# Patient Record
Sex: Female | Born: 1995 | Race: White | Hispanic: No | Marital: Single | Smoking: Never smoker
Health system: Southern US, Community
[De-identification: ages and names within clinical notes are randomized; demographics above are authoritative.]

## PROBLEM LIST (undated history)

## (undated) ENCOUNTER — Inpatient Hospital Stay: Payer: Self-pay

## (undated) DIAGNOSIS — L509 Urticaria, unspecified: Secondary | ICD-10-CM

## (undated) DIAGNOSIS — F1290 Cannabis use, unspecified, uncomplicated: Secondary | ICD-10-CM

## (undated) DIAGNOSIS — E7849 Other hyperlipidemia: Secondary | ICD-10-CM

## (undated) DIAGNOSIS — I10 Essential (primary) hypertension: Secondary | ICD-10-CM

## (undated) DIAGNOSIS — R51 Headache: Secondary | ICD-10-CM

## (undated) DIAGNOSIS — R519 Headache, unspecified: Secondary | ICD-10-CM

## (undated) DIAGNOSIS — N946 Dysmenorrhea, unspecified: Secondary | ICD-10-CM

## (undated) DIAGNOSIS — F4321 Adjustment disorder with depressed mood: Secondary | ICD-10-CM

## (undated) DIAGNOSIS — R809 Proteinuria, unspecified: Secondary | ICD-10-CM

## (undated) DIAGNOSIS — E669 Obesity, unspecified: Secondary | ICD-10-CM

## (undated) DIAGNOSIS — G43009 Migraine without aura, not intractable, without status migrainosus: Secondary | ICD-10-CM

## (undated) HISTORY — DX: Essential (primary) hypertension: I10

## (undated) HISTORY — DX: Morbid (severe) obesity due to excess calories: E66.01

## (undated) HISTORY — PX: NO PAST SURGERIES: SHX2092

## (undated) HISTORY — DX: Proteinuria, unspecified: R80.9

## (undated) HISTORY — DX: Dysmenorrhea, unspecified: N94.6

## (undated) HISTORY — DX: Obesity, unspecified: E66.9

## (undated) HISTORY — DX: Other hyperlipidemia: E78.49

## (undated) HISTORY — DX: Headache: R51

## (undated) HISTORY — DX: Cannabis use, unspecified, uncomplicated: F12.90

## (undated) HISTORY — DX: Adjustment disorder with depressed mood: F43.21

## (undated) HISTORY — DX: Urticaria, unspecified: L50.9

## (undated) HISTORY — DX: Migraine without aura, not intractable, without status migrainosus: G43.009

## (undated) HISTORY — DX: Headache, unspecified: R51.9

---
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
## 2013-07-18 IMAGING — US US RENAL KIDNEY
1 series · 14 of 25 positions shown · non-contrast
Comparison: none

REASON FOR EXAM: HTN microalbuminuria
COMMENTS:

[Series 1: us renal kidney · 0.31mm/px · 14 of 34 slices shown]
[im 1/34]
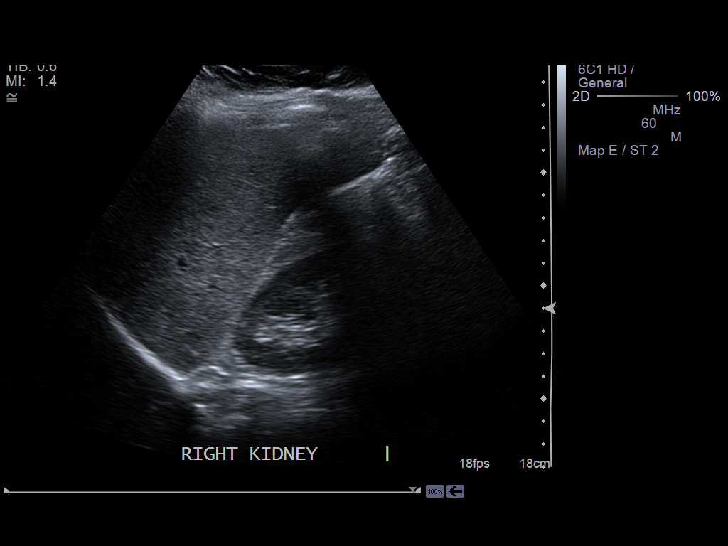
[im 3/34]
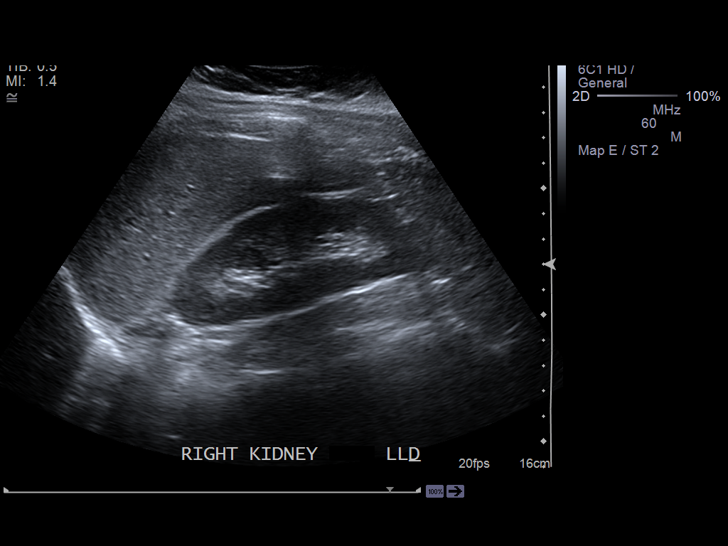
[im 6/34]
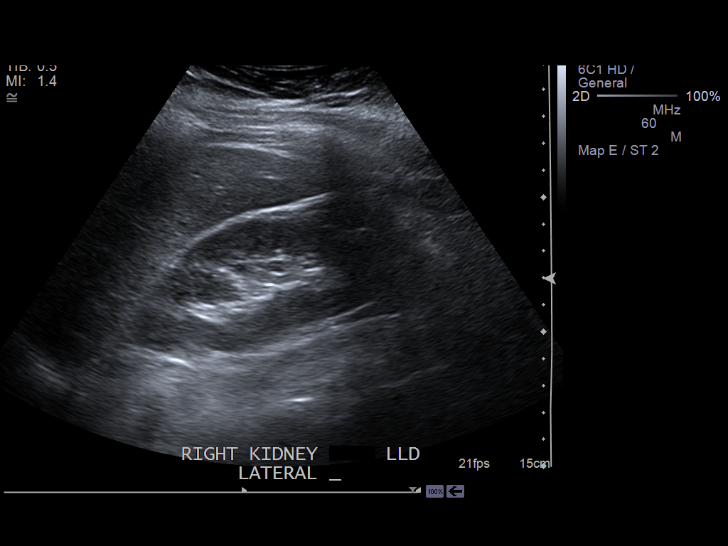
[im 9/34]
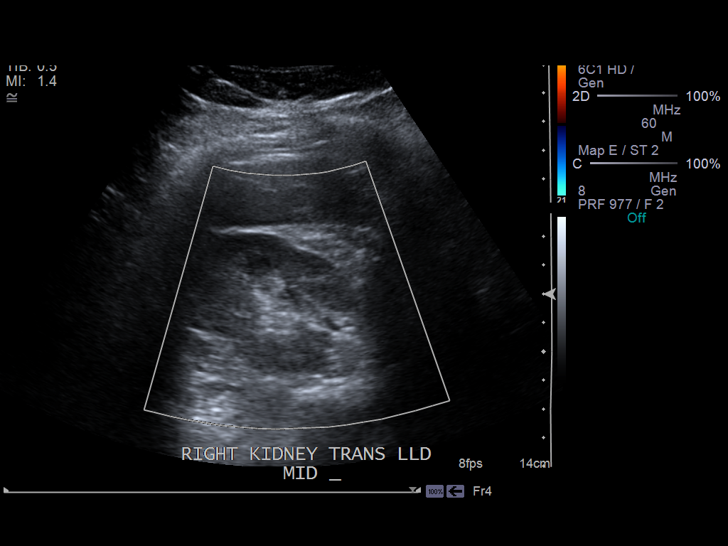
[im 12/34]
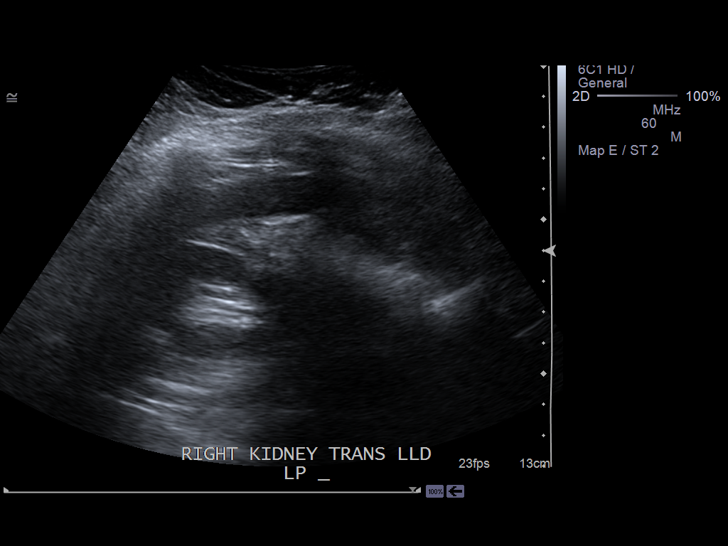
[im 13/34]
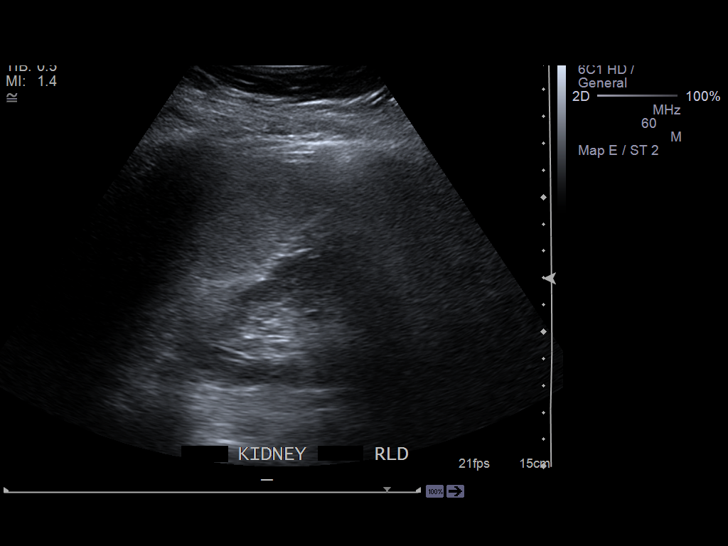
[im 16/34]
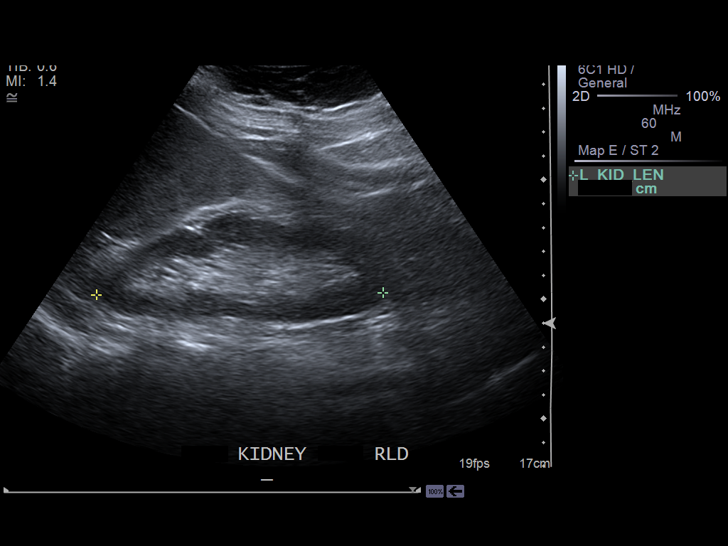
[im 18/34]
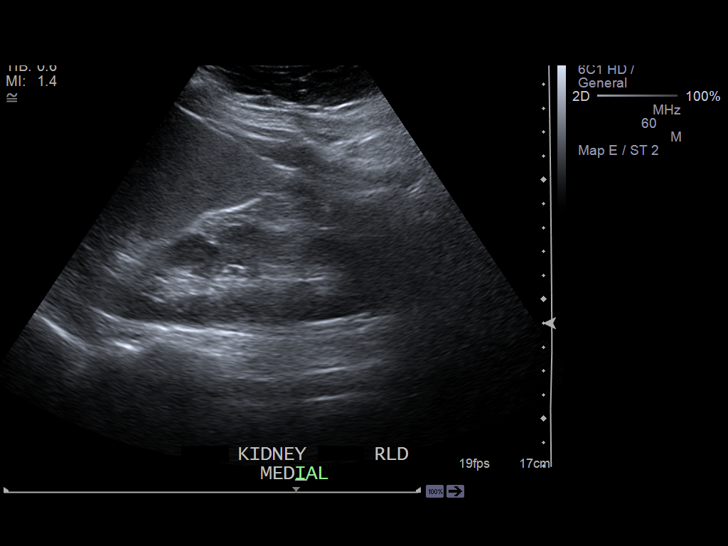
[im 21/34]
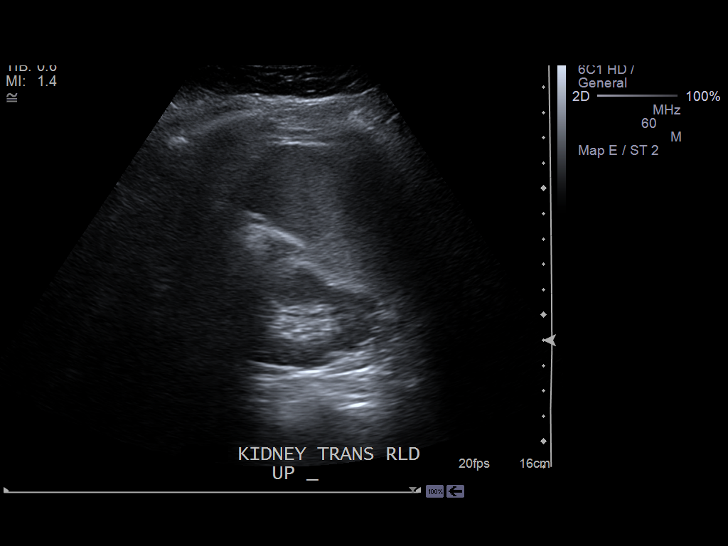
[im 23/34]
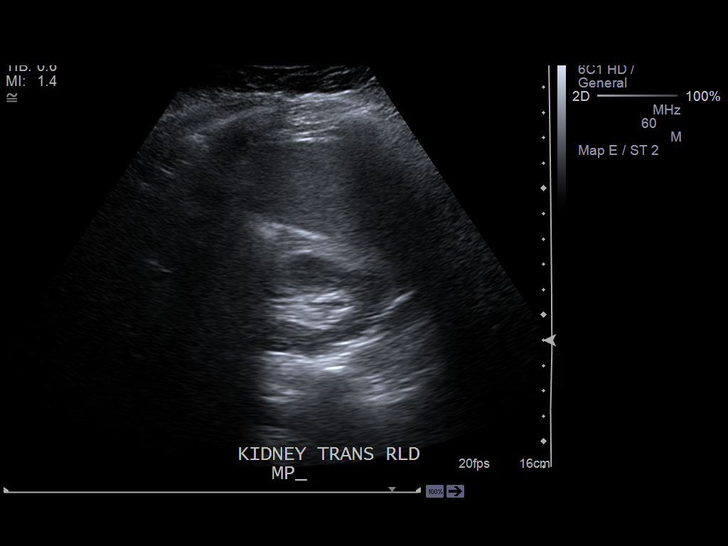
[im 25/34]
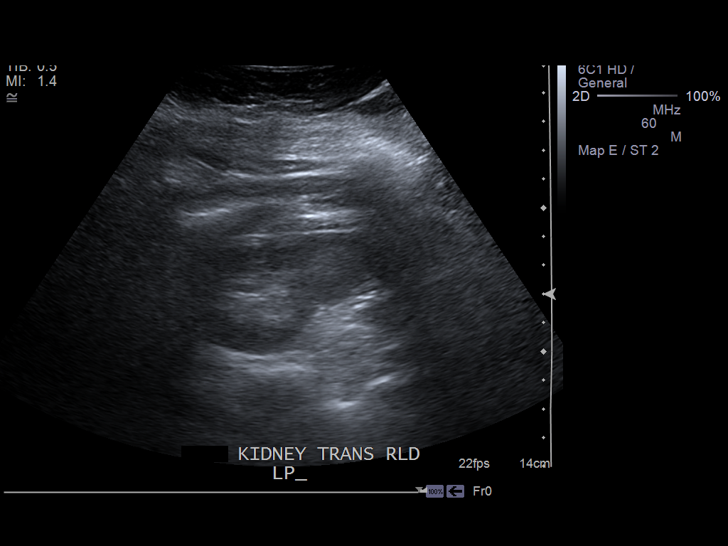
[im 28/34]
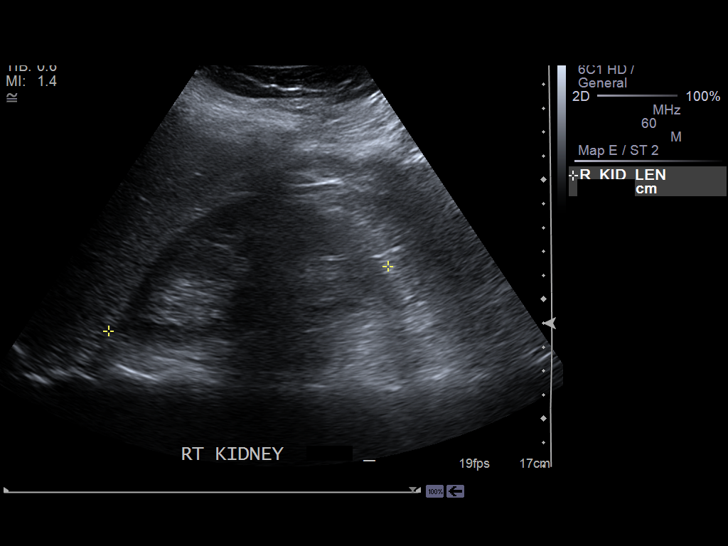
[im 31/34]
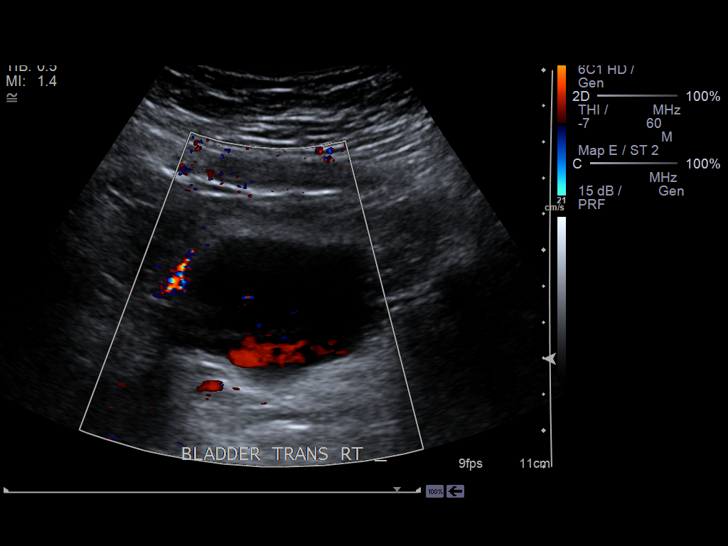
[im 34/34]
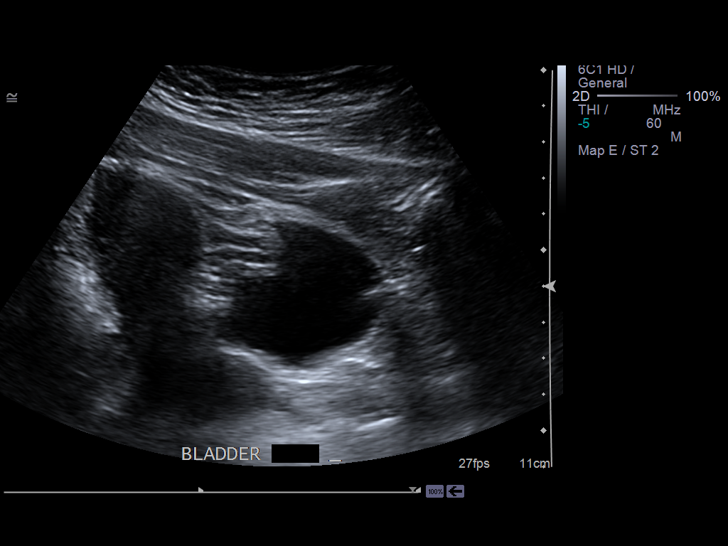

[14 of 25 positions shown; findings below may reference images not displayed]

PROCEDURE:     US  - US KIDNEY  - October 27, 2011  [DATE]

RESULT:     The kidneys are normal in echotexture and contour. The right
renal cortical echotexture remains lower than that of the adjacent liver. No
cystic or solid masses are demonstrated. No calcified stones are
demonstrated. No perinephric fluid collections are demonstrated.

The right kidney measures 12 x 5.3 x 4.5 cm. The left kidney measures 12 x
4.4 x 4.2 cm. The urinary bladder is adequately distended and bilateral
ureteral jets are demonstrated.
IMPRESSION: Normal renal ultrasound examination.

## 2014-04-11 DIAGNOSIS — F4321 Adjustment disorder with depressed mood: Secondary | ICD-10-CM | POA: Insufficient documentation

## 2014-04-20 DIAGNOSIS — F1290 Cannabis use, unspecified, uncomplicated: Secondary | ICD-10-CM

## 2014-04-20 HISTORY — DX: Cannabis use, unspecified, uncomplicated: F12.90

## 2014-10-08 ENCOUNTER — Encounter: Payer: Self-pay | Admitting: Family Medicine

## 2014-10-08 DIAGNOSIS — N946 Dysmenorrhea, unspecified: Secondary | ICD-10-CM | POA: Insufficient documentation

## 2014-10-08 DIAGNOSIS — L74510 Primary focal hyperhidrosis, axilla: Secondary | ICD-10-CM | POA: Insufficient documentation

## 2014-10-08 DIAGNOSIS — G43009 Migraine without aura, not intractable, without status migrainosus: Secondary | ICD-10-CM

## 2014-10-08 DIAGNOSIS — E66811 Obesity, class 1: Secondary | ICD-10-CM | POA: Insufficient documentation

## 2014-10-08 DIAGNOSIS — O10919 Unspecified pre-existing hypertension complicating pregnancy, unspecified trimester: Secondary | ICD-10-CM | POA: Insufficient documentation

## 2014-10-08 DIAGNOSIS — E669 Obesity, unspecified: Secondary | ICD-10-CM | POA: Insufficient documentation

## 2014-10-08 DIAGNOSIS — R809 Proteinuria, unspecified: Secondary | ICD-10-CM | POA: Insufficient documentation

## 2014-10-08 HISTORY — DX: Obesity, class 1: E66.811

## 2014-10-08 HISTORY — DX: Migraine without aura, not intractable, without status migrainosus: G43.009

## 2014-10-08 HISTORY — DX: Primary focal hyperhidrosis, axilla: L74.510

## 2014-10-11 ENCOUNTER — Ambulatory Visit (INDEPENDENT_AMBULATORY_CARE_PROVIDER_SITE_OTHER): Payer: BLUE CROSS/BLUE SHIELD | Admitting: Family Medicine

## 2014-10-11 ENCOUNTER — Encounter: Payer: Self-pay | Admitting: Family Medicine

## 2014-10-11 VITALS — BP 118/78 | HR 94 | Temp 98.3°F | Resp 16 | Ht 66.0 in | Wt 211.7 lb

## 2014-10-12 NOTE — Progress Notes (Signed)
Patient left without being seen because of long wait

## 2014-10-31 ENCOUNTER — Ambulatory Visit: Payer: BLUE CROSS/BLUE SHIELD | Admitting: Family Medicine

## 2014-12-11 ENCOUNTER — Ambulatory Visit (INDEPENDENT_AMBULATORY_CARE_PROVIDER_SITE_OTHER): Payer: BLUE CROSS/BLUE SHIELD | Admitting: Family Medicine

## 2014-12-11 ENCOUNTER — Encounter: Payer: Self-pay | Admitting: Family Medicine

## 2014-12-11 VITALS — BP 130/88 | HR 97 | Temp 98.6°F | Resp 16 | Ht 66.0 in | Wt 204.3 lb

## 2014-12-11 DIAGNOSIS — Z23 Encounter for immunization: Secondary | ICD-10-CM | POA: Diagnosis not present

## 2014-12-11 DIAGNOSIS — G43009 Migraine without aura, not intractable, without status migrainosus: Secondary | ICD-10-CM | POA: Diagnosis not present

## 2014-12-11 DIAGNOSIS — Z13220 Encounter for screening for lipoid disorders: Secondary | ICD-10-CM

## 2014-12-11 DIAGNOSIS — I10 Essential (primary) hypertension: Secondary | ICD-10-CM

## 2014-12-11 DIAGNOSIS — E669 Obesity, unspecified: Secondary | ICD-10-CM

## 2014-12-11 DIAGNOSIS — Z114 Encounter for screening for human immunodeficiency virus [HIV]: Secondary | ICD-10-CM | POA: Diagnosis not present

## 2014-12-11 DIAGNOSIS — R809 Proteinuria, unspecified: Secondary | ICD-10-CM | POA: Diagnosis not present

## 2014-12-11 MED ORDER — NADOLOL 40 MG PO TABS: 40.0000 mg | ORAL_TABLET | ORAL | Status: DC | PRN

## 2014-12-11 NOTE — Progress Notes (Signed)
Name: Lori Gross   MRN: [id number]    DOB: [birth year]   Date:12/11/2014       Progress Note  Subjective  Chief Complaint  Chief Complaint  Patient presents with  . Medication Refill    3 month F/U  . Hypertension    Checks at [facility] and gets 120's/80's  . Migraine    Well Controlled with preventative medication    HPI  HTN: she had microalbuminuria in January 2016, but she had gone one year without bp medication because of gap insurance, she has been on Nadolol now and we will recheck urine micro again, if elevated refer back to Nephrologist. She has a long history of HTN - since childhood and seen nephrologist initially for evaluation. Denies chest pain, no headaches.   Migraine headaches: she states last migraine episode was about one month ago, it lasted at most 2 hours , resolved with otc medication. She describes headache as a tight , throbbing sensation all over her head. She has photophobia. No nausea or vomiting.   Obesity: she lost 8 lbs since last visit, she works in a factory in the heat and thinks it is water loss.  She also states she has been eating smaller portions.   Patient Active Problem List   Diagnosis Date Noted  . Dysmenorrhea 10/08/2014  . Essential (primary) hypertension 10/08/2014  . 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 Microalbuminuria 10/08/2014  . Obesity (BMI 30.0-34.9) 10/08/2014  . Migraine without aura and responsive to treatment 10/08/2014    History reviewed. No pertinent past surgical history.  Family History  Problem Relation Age of Onset  . Hypertension Mother   . Cancer Mother     Common Bile Duct  . Hypertension Sister   . Hypertension Brother   . Diabetes Paternal Grandmother   . Diabetes Paternal Grandfather   . Hypertension Father     Social History   Social History  . Marital Status: Single    Spouse Name: N/A  . Number of Children: N/A  . Years of Education: N/A   Occupational History  . Not on file.    Social History Main Topics  . Smoking status: Never Smoker   . Smokeless tobacco: Never Used  . Alcohol Use: No  . Drug Use: No  . Sexual Activity:    Partners: Male    Birth Control/ Protection: Pill   Other Topics Concern  . Not on file   Social History Narrative     Current outpatient prescriptions:  .  nadolol (CORGARD) 40 MG tablet, Take 1 tablet by mouth as needed., Disp: , Rfl: 5 .  norgestimate-ethinyl estradiol (SPRINTEC 28) 0.25-35 MG-MCG tablet, Take 1 tablet by mouth daily., Disp: , Rfl:   No Known Allergies   ROS  Constitutional: Negative for fever positive for  weight change.  Respiratory: Negative for cough and shortness of breath.   Cardiovascular: Negative for chest pain or palpitations.  Gastrointestinal: Negative for abdominal pain, no bowel changes.  Musculoskeletal: Negative for gait problem or joint swelling.  Skin: Negative for rash.  Neurological: Negative for dizziness or headache.  No other specific complaints in a complete review of systems (except as listed in HPI above).  Objective  Filed Vitals:   12/11/14 0937  BP: 130/88  Pulse: 97  Temp: 98.6 F (37 C)  TempSrc: Oral  Resp: 16  Height:  (1.676 m)  Weight: 204 lb 4.8 oz (92.67 kg)  SpO2: 99%    Body mass  index is 32.99 kg/(m^2).  Physical Exam Constitutional: Patient appears well-developed and well-nourished. Obese  No distress.  HEENT: head atraumatic, normocephalic, pupils equal and reactive to light,  neck supple, throat within normal limits Cardiovascular: Normal rate, regular rhythm and normal heart sounds.  No murmur heard. No BLE edema. Pulmonary/Chest: Effort normal and breath sounds normal. No respiratory distress. Abdominal: Soft.  There is no tenderness. Psychiatric: Patient has a normal mood and affect. behavior is normal. Judgment and thought content normal.   PHQ2/9: Depression screen [facility] 2/9 12/11/2014 10/11/2014  Decreased Interest 0 0  Down,  Depressed, Hopeless 0 0  PHQ - 2 Score 0 0     Fall Risk: Fall Risk  12/11/2014 10/11/2014  Falls in the past year? No No      Assessment & Plan  1. Essential (primary) hypertension DBP is a little elevated, but still below 140/90  -Nadolol was refilled for 6 months - Comprehensive metabolic panel - CBC with Differential/Platelet  2. Microalbuminuria Recheck level  - POCT UA - Microalbumin  3. Obesity (BMI 30.0-34.9) Discussed with the patient the risk posed by an increased BMI. Discussed importance of portion control, calorie counting and at least 150 minutes of physical activity weekly. Avoid sweet beverages and drink more water. Eat at least 6 servings of fruit and vegetables daily   4. Migraine without aura and responsive to treatment Doing well at this time  5. Needs flu shot  - Flu Vaccine QUAD 36+ mos IM - refused  6. Screening for HIV (human immunodeficiency virus)  - HIV antibody  7. Lipid screening  - Lipid panel

## 2015-02-19 ENCOUNTER — Other Ambulatory Visit: Payer: Self-pay | Admitting: Family Medicine

## 2015-02-19 DIAGNOSIS — D649 Anemia, unspecified: Secondary | ICD-10-CM

## 2015-02-19 LAB — CBC WITH DIFFERENTIAL/PLATELET
BASOS: 0 %
Basophils Absolute: 0 10*3/uL (ref 0.0–0.2)
EOS (ABSOLUTE): 0.1 10*3/uL (ref 0.0–0.4)
Eos: 2 %
Hematocrit: 33.4 % — ABNORMAL LOW (ref 34.0–46.6)
Hemoglobin: 11.2 g/dL (ref 11.1–15.9)
IMMATURE GRANS (ABS): 0 10*3/uL (ref 0.0–0.1)
Immature Granulocytes: 0 %
Lymphocytes Absolute: 1.9 10*3/uL (ref 0.7–3.1)
Lymphs: 33 %
MCH: 31 pg (ref 26.6–33.0)
MCHC: 33.5 g/dL (ref 31.5–35.7)
MCV: 93 fL (ref 79–97)
MONOCYTES: 8 %
Monocytes Absolute: 0.4 10*3/uL (ref 0.1–0.9)
NEUTROS ABS: 3.3 10*3/uL (ref 1.4–7.0)
Neutrophils: 57 %
Platelets: 244 10*3/uL (ref 150–379)
RBC: 3.61 x10E6/uL — ABNORMAL LOW (ref 3.77–5.28)
RDW: 15.3 % (ref 12.3–15.4)
WBC: 5.7 10*3/uL (ref 3.4–10.8)

## 2015-02-20 LAB — COMPREHENSIVE METABOLIC PANEL
ALBUMIN: 4.2 g/dL (ref 3.5–5.5)
ALK PHOS: 73 IU/L (ref 39–117)
ALT: 10 IU/L (ref 0–32)
AST: 13 IU/L (ref 0–40)
Albumin/Globulin Ratio: 1.8 (ref 1.1–2.5)
BILIRUBIN TOTAL: 0.5 mg/dL (ref 0.0–1.2)
BUN / CREAT RATIO: 14 (ref 8–20)
BUN: 10 mg/dL (ref 6–20)
CHLORIDE: 100 mmol/L (ref 97–106)
CO2: 22 mmol/L (ref 18–29)
CREATININE: 0.73 mg/dL (ref 0.57–1.00)
Calcium: 9.4 mg/dL (ref 8.7–10.2)
GFR calc Af Amer: 136 mL/min/{1.73_m2} (ref >59)
GFR calc non Af Amer: 118 mL/min/{1.73_m2} (ref >59)
Globulin, Total: 2.3 g/dL (ref 1.5–4.5)
Glucose: 79 mg/dL (ref 65–99)
Potassium: 3.7 mmol/L (ref 3.5–5.2)
Sodium: 139 mmol/L (ref 136–144)
Total Protein: 6.5 g/dL (ref 6.0–8.5)

## 2015-02-20 LAB — LIPID PANEL
Chol/HDL Ratio: 3.3 ratio units (ref 0.0–4.4)
Cholesterol, Total: 200 mg/dL — ABNORMAL HIGH (ref 100–199)
HDL: 61 mg/dL (ref >39)
LDL CALC: 111 mg/dL — AB (ref 0–99)
Triglycerides: 139 mg/dL (ref 0–149)
VLDL CHOLESTEROL CAL: 28 mg/dL (ref 5–40)

## 2015-02-20 LAB — HIV ANTIBODY (ROUTINE TESTING W REFLEX): HIV SCREEN 4TH GENERATION: NONREACTIVE

## 2015-02-21 ENCOUNTER — Ambulatory Visit (INDEPENDENT_AMBULATORY_CARE_PROVIDER_SITE_OTHER): Payer: BLUE CROSS/BLUE SHIELD | Admitting: Family Medicine

## 2015-02-21 ENCOUNTER — Encounter: Payer: Self-pay | Admitting: *Deleted

## 2015-02-21 ENCOUNTER — Encounter: Payer: Self-pay | Admitting: Family Medicine

## 2015-02-21 VITALS — BP 126/72 | HR 90 | Temp 98.4°F | Resp 14 | Ht 66.0 in | Wt 205.3 lb

## 2015-02-21 DIAGNOSIS — N63 Unspecified lump in breast: Secondary | ICD-10-CM | POA: Diagnosis not present

## 2015-02-21 DIAGNOSIS — Z113 Encounter for screening for infections with a predominantly sexual mode of transmission: Secondary | ICD-10-CM | POA: Diagnosis not present

## 2015-02-21 DIAGNOSIS — N6322 Unspecified lump in the left breast, upper inner quadrant: Secondary | ICD-10-CM

## 2015-02-21 DIAGNOSIS — Z23 Encounter for immunization: Secondary | ICD-10-CM

## 2015-02-21 DIAGNOSIS — Z124 Encounter for screening for malignant neoplasm of cervix: Secondary | ICD-10-CM

## 2015-02-21 DIAGNOSIS — Z0000 Encounter for general adult medical examination without abnormal findings: Secondary | ICD-10-CM | POA: Diagnosis not present

## 2015-02-21 DIAGNOSIS — Z01419 Encounter for gynecological examination (general) (routine) without abnormal findings: Secondary | ICD-10-CM

## 2015-02-21 NOTE — Progress Notes (Signed)
Name: Lori Gross   MRN: [id number]    DOB: October 02, 1995   Date:02/21/2015       Progress Note  Subjective  Chief Complaint  Chief Complaint  Patient presents with  . Annual Exam    HPI  Well Woman: she has cramps during her cycle, lasts about one day and able to function. No vaginal discharge, or itching, no pain during intercourse. On ocp's   Patient Active Problem List   Diagnosis Date Noted  . Dysmenorrhea 10/08/2014  . Essential (primary) hypertension 10/08/2014  . 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 Microalbuminuria 10/08/2014  . Obesity (BMI 30.0-34.9) 10/08/2014  . Migraine without aura and responsive to treatment 10/08/2014    History reviewed. No pertinent past surgical history.  Family History  Problem Relation Age of Onset  . Hypertension Mother   . Cancer Mother     Common Bile Duct  . Hypertension Sister   . Hypertension Brother   . Diabetes Paternal Grandmother   . Diabetes Paternal Grandfather   . Hypertension Father     Social History   Social History  . Marital Status: Single    Spouse Name: N/A  . Number of Children: N/A  . Years of Education: N/A   Occupational History  . Not on file.   Social History Main Topics  . Smoking status: Never Smoker   . Smokeless tobacco: Never Used  . Alcohol Use: No  . Drug Use: No  . Sexual Activity:    Partners: Male    Birth Control/ Protection: Pill   Other Topics Concern  . Not on file   Social History Narrative     Current outpatient prescriptions:  .  nadolol (CORGARD) 40 MG tablet, Take 1 tablet (40 mg total) by mouth as needed., Disp: 90 tablet, Rfl: 1 .  norgestimate-ethinyl estradiol (SPRINTEC 28) 0.25-35 MG-MCG tablet, Take 1 tablet by mouth daily., Disp: , Rfl:   No Known Allergies   ROS  Constitutional: Negative for fever or weight change.  Respiratory: Negative for cough and shortness of breath.   Cardiovascular: Negative for chest pain or palpitations.   Gastrointestinal: Negative for abdominal pain, no bowel changes.  Musculoskeletal: Negative for gait problem or joint swelling.  Skin: Negative for rash.  Neurological: Negative for dizziness or headache.  No other specific complaints in a complete review of systems (except as listed in HPI above).  Objective  Filed Vitals:   02/21/15 0842  BP: 126/72  Pulse: 90  Temp: 98.4 F (36.9 C)  TempSrc: Oral  Resp: 14  Height:  (1.676 m)  Weight: 205 lb 4.8 oz (93.123 kg)  SpO2: 99%    Body mass index is 33.15 kg/(m^2).  Physical Exam  Constitutional: Patient appears well-developed and obese. No distress.  HENT: Head: Normocephalic and atraumatic. Ears: B TMs ok, no erythema or effusion; Nose: Nose normal. Mouth/Throat: Oropharynx is clear and moist. No oropharyngeal exudate.  Eyes: Conjunctivae and EOM are normal. Pupils are equal, round, and reactive to light. No scleral icterus.  Neck: Normal range of motion. Neck supple. No JVD present. No thyromegaly present.  Cardiovascular: Normal rate, regular rhythm and normal heart sounds.  No murmur heard. No BLE edema. Pulmonary/Chest: Effort normal and breath sounds normal. No respiratory distress. Abdominal: Soft. Bowel sounds are normal, no distension. There is no tenderness. no masses Breast: small cluster of lumps on left upper inner quadrant, largest of pea size at 10 o'clock, no nipple discharge or rashes FEMALE GENITALIA:  External genitalia normal External urethra normal Vaginal vault normal without discharge or lesions Cervix normal without discharge or lesions Bimanual exam normal without masses RECTAL: no external hemorrhoids Musculoskeletal: Normal range of motion, no joint effusions. No gross deformities Neurological: he is alert and oriented to person, place, and time. No cranial nerve deficit. Coordination, balance, strength, speech and gait are normal.  Skin: Skin is warm and dry. No rash noted. No erythema.   Psychiatric: Patient has a normal mood and affect. behavior is normal. Judgment and thought content normal.  Recent Results (from the past 2160 hour(s))  Lipid panel     Status: Abnormal   Collection Time: 02/19/15 10:40 AM  Result Value Ref Range   Cholesterol, Total 200 (H) 100 - 199 mg/dL   Triglycerides 409139 0 - 149 mg/dL   HDL 61 >81>39 mg/dL    Comment: According to ATP-III Guidelines, HDL-C >59 mg/dL is considered a negative risk factor for CHD.    VLDL Cholesterol Cal 28 5 - 40 mg/dL   LDL Calculated 191111 (H) 0 - 99 mg/dL   Chol/HDL Ratio 3.3 0.0 - 4.4 ratio units    Comment:                                   T. Chol/HDL Ratio                                             Men  Women                               1/2 Avg.Risk  3.4    3.3                                   Avg.Risk  5.0    4.4                                2X Avg.Risk  9.6    7.1                                3X Avg.Risk 23.4   11.0   Comprehensive metabolic panel     Status: None   Collection Time: 02/19/15 10:40 AM  Result Value Ref Range   Glucose 79 65 - 99 mg/dL   BUN 10 6 - 20 mg/dL   Creatinine, Ser 4.780.73 0.57 - 1.00 mg/dL   GFR calc non Af Amer 118 >59 mL/min/1.73   GFR calc Af Amer 136 >59 mL/min/1.73   BUN/Creatinine Ratio 14 8 - 20   Sodium 139 136 - 144 mmol/L   Potassium 3.7 3.5 - 5.2 mmol/L   Chloride 100 97 - 106 mmol/L   CO2 22 18 - 29 mmol/L   Calcium 9.4 8.7 - 10.2 mg/dL   Total Protein 6.5 6.0 - 8.5 g/dL   Albumin 4.2 3.5 - 5.5 g/dL   Globulin, Total 2.3 1.5 - 4.5 g/dL   Albumin/Globulin Ratio 1.8 1.1 - 2.5   Bilirubin Total 0.5 0.0 - 1.2 mg/dL   Alkaline Phosphatase  73 39 - 117 IU/L   AST 13 0 - 40 IU/L   ALT 10 0 - 32 IU/L  CBC with Differential/Platelet     Status: Abnormal   Collection Time: 02/19/15 10:40 AM  Result Value Ref Range   WBC 5.7 3.4 - 10.8 x10E3/uL   RBC 3.61 (L) 3.77 - 5.28 x10E6/uL   Hemoglobin 11.2 11.1 - 15.9 g/dL   Hematocrit 81.1 (L) 91.4 - 46.6 %   MCV 93  79 - 97 fL   MCH 31.0 26.6 - 33.0 pg   MCHC 33.5 31.5 - 35.7 g/dL   RDW 78.2 95.6 - 21.3 %   Platelets 244 150 - 379 x10E3/uL   Neutrophils 57 %   Lymphs 33 %   Monocytes 8 %   Eos 2 %   Basos 0 %   Neutrophils Absolute 3.3 1.4 - 7.0 x10E3/uL   Lymphocytes Absolute 1.9 0.7 - 3.1 x10E3/uL   Monocytes Absolute 0.4 0.1 - 0.9 x10E3/uL   EOS (ABSOLUTE) 0.1 0.0 - 0.4 x10E3/uL   Basophils Absolute 0.0 0.0 - 0.2 x10E3/uL   Immature Granulocytes 0 %   Immature Grans (Abs) 0.0 0.0 - 0.1 x10E3/uL  HIV antibody     Status: None   Collection Time: 02/19/15 10:40 AM  Result Value Ref Range   HIV Screen 4th Generation wRfx Non Reactive Non Reactive    PHQ2/9: Depression screen [facility] 2/9 02/21/2015 12/11/2014 10/11/2014  Decreased Interest 0 0 0  Down, Depressed, Hopeless 0 0 0  PHQ - 2 Score 0 0 0    Fall Risk: Fall Risk  02/21/2015 12/11/2014 10/11/2014  Falls in the past year? No No No    Functional Status Survey: Is the patient deaf or have difficulty hearing?: No Does the patient have difficulty seeing, even when wearing glasses/contacts?: No Does the patient have difficulty concentrating, remembering, or making decisions?: No Does the patient have difficulty walking or climbing stairs?: No Does the patient have difficulty dressing or bathing?: No Does the patient have difficulty doing errands alone such as visiting a doctor's office or shopping?: No    Assessment & Plan  1. Well woman exam  Discussed importance of 150 minutes of physical activity weekly, eat two servings of fish weekly, eat one serving of tree nuts ( cashews, pistachios, pecans, almonds.Marland Kitchen) every other day, eat 6 servings of fruit/vegetables daily and drink plenty of water and avoid sweet beverages.   2. Needs flu shot  - Flu Vaccine QUAD 36+ mos PF IM (Fluarix & Fluzone Quad PF)  3. Cervical cancer screening  - Pap IG and Chlamydia/Gonococcus, NAA  4. Routine screening for STI (sexually transmitted  infection)    5. Breast lump on left side at 10 o'clock position  - Ambulatory referral to General Surgery

## 2015-02-22 ENCOUNTER — Other Ambulatory Visit: Payer: Self-pay | Admitting: Family Medicine

## 2015-02-23 LAB — FERRITIN: Ferritin: 9 ng/mL — ABNORMAL LOW (ref 15–150)

## 2015-02-23 LAB — IRON AND TIBC
Iron Saturation: 23 % (ref 15–55)
Iron: 104 ug/dL (ref 27–159)
TIBC: 448 ug/dL (ref 250–450)
UIBC: 344 ug/dL (ref 131–425)

## 2015-02-24 ENCOUNTER — Other Ambulatory Visit: Payer: Self-pay | Admitting: Family Medicine

## 2015-02-24 LAB — HM PAP SMEAR: HM Pap smear: NEGATIVE

## 2015-02-24 MED ORDER — FERROUS SULFATE 325 (65 FE) MG PO TABS: 325.0000 mg | ORAL_TABLET | Freq: Every day | ORAL | Status: DC

## 2015-02-25 ENCOUNTER — Other Ambulatory Visit: Payer: Self-pay | Admitting: Family Medicine

## 2015-02-25 DIAGNOSIS — D509 Iron deficiency anemia, unspecified: Secondary | ICD-10-CM

## 2015-02-25 LAB — PAP IG AND CT-NG NAA: PAP Smear Comment: 0

## 2015-02-27 ENCOUNTER — Other Ambulatory Visit: Payer: Self-pay | Admitting: Family Medicine

## 2015-02-27 DIAGNOSIS — D509 Iron deficiency anemia, unspecified: Secondary | ICD-10-CM

## 2015-03-07 ENCOUNTER — Encounter: Payer: Self-pay | Admitting: Family Medicine

## 2015-03-07 ENCOUNTER — Telehealth: Payer: Self-pay | Admitting: Family Medicine

## 2015-03-07 ENCOUNTER — Ambulatory Visit (INDEPENDENT_AMBULATORY_CARE_PROVIDER_SITE_OTHER): Payer: BLUE CROSS/BLUE SHIELD | Admitting: Family Medicine

## 2015-03-07 VITALS — BP 128/82 | HR 90 | Temp 98.4°F | Resp 18 | Ht 65.0 in | Wt 203.2 lb

## 2015-03-07 DIAGNOSIS — N926 Irregular menstruation, unspecified: Secondary | ICD-10-CM | POA: Diagnosis not present

## 2015-03-07 DIAGNOSIS — I10 Essential (primary) hypertension: Secondary | ICD-10-CM

## 2015-03-07 DIAGNOSIS — Z3201 Encounter for pregnancy test, result positive: Secondary | ICD-10-CM | POA: Diagnosis not present

## 2015-03-07 LAB — POCT URINE PREGNANCY: PREG TEST UR: POSITIVE — AB

## 2015-03-07 MED ORDER — METHYLDOPA 250 MG PO TABS: 250.0000 mg | ORAL_TABLET | Freq: Three times a day (TID) | ORAL | Status: DC

## 2015-03-07 MED ORDER — COMPLETENATE 29-1 MG PO CHEW: 1.0000 | CHEWABLE_TABLET | Freq: Every day | ORAL | Status: DC

## 2015-03-07 NOTE — Progress Notes (Signed)
Name: Lori Gross   MRN: [id number]    DOB: [birth year]   Date:03/07/2015       Progress Note  Subjective  Chief Complaint  Chief Complaint  Patient presents with  . Amenorrhea    missed here period for the month of november is on birth control and states is taking as directed.    HPI  Pregnancy test positive: she has been compliant with ocp's but missed her cycle one week ago, she re-started a new package of ocp, but not sure why she skipped the cycle. She denies breast tenderness, no morning sickness, mild cramping, no spotting or bleeding.   HTN: taking only half of Nadolol because bp was running low at home, today bp is at goal, denies headaches, no chest pain \  Patient Active Problem List   Diagnosis Date Noted  . Dysmenorrhea 10/08/2014  . Essential (primary) hypertension 10/08/2014  . 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 Microalbuminuria 10/08/2014  . Obesity (BMI 30.0-34.9) 10/08/2014  . Migraine without aura and responsive to treatment 10/08/2014    History reviewed. No pertinent past surgical history.  Family History  Problem Relation Age of Onset  . Hypertension Mother   . Cancer Mother 89    Common Bile Duct  . Hypertension Sister   . Hypertension Brother   . Diabetes Paternal Grandmother   . Cancer Paternal Grandmother 70    brain cancer  . Diabetes Paternal Grandfather   . Hypertension Father   . Cancer Father 42    kidney cancer    Social History   Social History  . Marital Status: Single    Spouse Name: N/A  . Number of Children: N/A  . Years of Education: N/A   Occupational History  . Not on file.   Social History Main Topics  . Smoking status: Never Smoker   . Smokeless tobacco: Never Used  . Alcohol Use: No  . Drug Use: No  . Sexual Activity:    Partners: Male    Birth Control/ Protection: Pill   Other Topics Concern  . Not on file   Social History Narrative     Current outpatient prescriptions:  .  ferrous sulfate  325 (65 FE) MG tablet, Take 1 tablet (325 mg total) by mouth daily with breakfast., Disp: 60 tablet, Rfl: 1 .  methyldopa (ALDOMET) 250 MG tablet, Take 1 tablet (250 mg total) by mouth 3 (three) times daily., Disp: 90 tablet, Rfl: 0 .  prenatal vitamin w/FE, FA (NATACHEW) 29-1 MG CHEW chewable tablet, Chew 1 tablet by mouth daily at 12 noon., Disp: 30 tablet, Rfl: 0  No Known Allergies   ROS  Constitutional: Negative for fever or significant  weight change.  Respiratory: Negative for cough and shortness of breath.   Cardiovascular: Negative for chest pain or palpitations.  Gastrointestinal: Negative for abdominal pain - except for mild cramping, going more often since started iron pills  Musculoskeletal: Negative for gait problem or joint swelling.  Skin: Negative for rash.  Neurological: Negative for dizziness or headache.  No other specific complaints in a complete review of systems (except as listed in HPI above).  Objective  Filed Vitals:   03/07/15 0910  BP: 128/82  Pulse: 90  Temp: 98.4 F (36.9 C)  TempSrc: Oral  Resp: 18  Height: 5\' 5"  (1.651 m)  Weight: 203 lb 3.2 oz (92.171 kg)  SpO2: 98%    Body mass index is 33.81 kg/(m^2).  Physical Exam  Constitutional: Patient appears well-developed  and well-nourished. Obese No distress.  HEENT: head atraumatic, normocephalic, pupils equal and reactive to light, neck supple, throat within normal limits Cardiovascular: Normal rate, regular rhythm and normal heart sounds.  No murmur heard. No BLE edema. Pulmonary/Chest: Effort normal and breath sounds normal. No respiratory distress. Abdominal: Soft.  There is no tenderness. Psychiatric: Patient has a normal mood and affect. behavior is normal. Judgment and thought content normal.  Recent Results (from the past 2160 hour(s))  Lipid panel     Status: Abnormal   Collection Time: 02/19/15 10:40 AM  Result Value Ref Range   Cholesterol, Total 200 (H) 100 - 199 mg/dL    Triglycerides 161 0 - 149 mg/dL   HDL 61 >09 mg/dL    Comment: According to ATP-III Guidelines, HDL-C >59 mg/dL is considered a negative risk factor for CHD.    VLDL Cholesterol Cal 28 5 - 40 mg/dL   LDL Calculated 604 (H) 0 - 99 mg/dL   Chol/HDL Ratio 3.3 0.0 - 4.4 ratio units    Comment:                                   T. Chol/HDL Ratio                                             Men  Women                               1/2 Avg.Risk  3.4    3.3                                   Avg.Risk  5.0    4.4                                2X Avg.Risk  9.6    7.1                                3X Avg.Risk 23.4   11.0   Comprehensive metabolic panel     Status: None   Collection Time: 02/19/15 10:40 AM  Result Value Ref Range   Glucose 79 65 - 99 mg/dL   BUN 10 6 - 20 mg/dL   Creatinine, Ser 5.40 0.57 - 1.00 mg/dL   GFR calc non Af Amer 118 >59 mL/min/1.73   GFR calc Af Amer 136 >59 mL/min/1.73   BUN/Creatinine Ratio 14 8 - 20   Sodium 139 136 - 144 mmol/L   Potassium 3.7 3.5 - 5.2 mmol/L   Chloride 100 97 - 106 mmol/L   CO2 22 18 - 29 mmol/L   Calcium 9.4 8.7 - 10.2 mg/dL   Total Protein 6.5 6.0 - 8.5 g/dL   Albumin 4.2 3.5 - 5.5 g/dL   Globulin, Total 2.3 1.5 - 4.5 g/dL   Albumin/Globulin Ratio 1.8 1.1 - 2.5   Bilirubin Total 0.5 0.0 - 1.2 mg/dL   Alkaline Phosphatase 73 39 - 117 IU/L   AST 13 0 - 40 IU/L   ALT 10 0 - 32  IU/L  CBC with Differential/Platelet     Status: Abnormal   Collection Time: 02/19/15 10:40 AM  Result Value Ref Range   WBC 5.7 3.4 - 10.8 x10E3/uL   RBC 3.61 (L) 3.77 - 5.28 x10E6/uL   Hemoglobin 11.2 11.1 - 15.9 g/dL   Hematocrit 40.9 (L) 81.1 - 46.6 %   MCV 93 79 - 97 fL   MCH 31.0 26.6 - 33.0 pg   MCHC 33.5 31.5 - 35.7 g/dL   RDW 91.4 78.2 - 95.6 %   Platelets 244 150 - 379 x10E3/uL   Neutrophils 57 %   Lymphs 33 %   Monocytes 8 %   Eos 2 %   Basos 0 %   Neutrophils Absolute 3.3 1.4 - 7.0 x10E3/uL   Lymphocytes Absolute 1.9 0.7 - 3.1 x10E3/uL    Monocytes Absolute 0.4 0.1 - 0.9 x10E3/uL   EOS (ABSOLUTE) 0.1 0.0 - 0.4 x10E3/uL   Basophils Absolute 0.0 0.0 - 0.2 x10E3/uL   Immature Granulocytes 0 %   Immature Grans (Abs) 0.0 0.0 - 0.1 x10E3/uL  HIV antibody     Status: None   Collection Time: 02/19/15 10:40 AM  Result Value Ref Range   HIV Screen 4th Generation wRfx Non Reactive Non Reactive  Pap IG and Chlamydia/Gonococcus, NAA     Status: None   Collection Time: 02/21/15 12:00 AM  Result Value Ref Range   DIAGNOSIS: Comment     Comment: NEGATIVE FOR INTRAEPITHELIAL LESION AND MALIGNANCY.   Specimen adequacy: Comment     Comment: Satisfactory for evaluation. Endocervical and/or squamous metaplastic cells (endocervical component) are present.    CLINICIAN PROVIDED ICD10: Comment     Comment: Z12.4   Performed by: Comment     Comment: Shawanda Long, Cytotechnologist (ASCP)   PAP SMEAR COMMENT .    Note: Comment     Comment: The Pap smear is a screening test designed to aid in the detection of premalignant and malignant conditions of the uterine cervix.  It is not a diagnostic procedure and should not be used as the sole means of detecting cervical cancer.  Both false-positive and false-negative reports do occur.    Test Methodology Comment     Comment: This liquid based ThinPrep(R) pap test was screened with the use of an image guided system.   Iron and TIBC     Status: None   Collection Time: 02/22/15  9:29 AM  Result Value Ref Range   Total Iron Binding Capacity 448 250 - 450 ug/dL   UIBC 213 086 - 578 ug/dL   Iron 469 27 - 629 ug/dL   Iron Saturation 23 15 - 55 %  Ferritin     Status: Abnormal   Collection Time: 02/22/15  9:29 AM  Result Value Ref Range   Ferritin 9 (L) 15 - 150 ng/mL  POCT urine pregnancy     Status: Abnormal   Collection Time: 03/07/15  9:24 AM  Result Value Ref Range   Preg Test, Ur Positive (A) Negative     PHQ2/9: Depression screen [facility] 2/9 02/21/2015 12/11/2014 10/11/2014  Decreased  Interest 0 0 0  Down, Depressed, Hopeless 0 0 0  PHQ - 2 Score 0 0 0    Fall Risk: Fall Risk  02/21/2015 12/11/2014 10/11/2014  Falls in the past year? No No No    Assessment & Plan  1. Missed period  - POCT urine pregnancy, LMP 01/30/2015 - approximately, due date 11/06/2015  2. Essential (primary) hypertension  We will change to Methyldopa since Nadolol is not safe on 2nd and 3rd trimester of pregnacy - methyldopa (ALDOMET) 250 MG tablet; Take 1 tablet (250 mg total) by mouth 3 (three) times daily.  Dispense: 90 tablet; Refill: 0  3. Positive pregnancy test  Start prenatal, follow up with ob, only take Tylenol prn otc, no alcohol, tobacco or drug of abuse. Explained 1/4 pregnancies end in miscarriage. She is worried because her father does not like her boyfriend - methyldopa (ALDOMET) 250 MG tablet; Take 1 tablet (250 mg total) by mouth 3 (three) times daily.  Dispense: 90 tablet; Refill: 0 - prenatal vitamin w/FE, FA (NATACHEW) 29-1 MG CHEW chewable tablet; Chew 1 tablet by mouth daily at 12 noon.  Dispense: 30 tablet; Refill: 0 - Ambulatory referral to Gynecology

## 2015-03-07 NOTE — Telephone Encounter (Signed)
Tell her to buy any type of otc prenatal vitamin until seen by OB

## 2015-03-07 NOTE — Telephone Encounter (Signed)
[facility] that the Natachew is to expensive (over $100) and that there is no generic for the pharmacy to use.

## 2015-03-08 NOTE — Telephone Encounter (Signed)
Patient notified

## 2015-03-11 ENCOUNTER — Ambulatory Visit (INDEPENDENT_AMBULATORY_CARE_PROVIDER_SITE_OTHER): Payer: BLUE CROSS/BLUE SHIELD | Admitting: General Surgery

## 2015-03-11 ENCOUNTER — Telehealth: Payer: Self-pay | Admitting: Family Medicine

## 2015-03-11 ENCOUNTER — Encounter: Payer: Self-pay | Admitting: General Surgery

## 2015-03-11 VITALS — BP 130/72 | HR 78 | Resp 14 | Ht 66.0 in | Wt 204.0 lb

## 2015-03-11 DIAGNOSIS — N6320 Unspecified lump in the left breast, unspecified quadrant: Secondary | ICD-10-CM | POA: Insufficient documentation

## 2015-03-11 DIAGNOSIS — N63 Unspecified lump in breast: Secondary | ICD-10-CM | POA: Diagnosis not present

## 2015-03-11 NOTE — Progress Notes (Signed)
Patient ID: Lori Gross, female   DOB: 11/05/1995, 19 y.o.   MRN: [id number]  Chief Complaint  Patient presents with  . Mass    left breast    HPI Lori Gross is a 19 y.o. female here today for a evaluation of a left breast mass. She states that she had not noticed any problems with the breast. This was found on her yearly exam. Patient is currently [redacted] weeks pregnant.  EDC: July 2017. HPI  Past Medical History  Diagnosis Date  . Grieving   . Hypertension   . Microalbuminuria   . Benign headache   . Migraine without aura and without status migrainosus, not intractable   . Dysmenorrhea   . Obesity   . Urticaria     No past surgical history on file.  Family History  Problem Relation Age of Onset  . Hypertension Mother   . Cancer Mother 2847    Common Bile Duct  . Hypertension Sister   . Hypertension Brother   . Diabetes Paternal Grandmother   . Cancer Paternal Grandmother 1774    brain cancer  . Diabetes Paternal Grandfather   . Hypertension Father   . Cancer Father 3650    kidney cancer    Social History Social History  Substance Use Topics  . Smoking status: Never Smoker   . Smokeless tobacco: Never Used  . Alcohol Use: No    No Known Allergies  Current Outpatient Prescriptions  Medication Sig Dispense Refill  . ferrous sulfate 325 (65 FE) MG tablet Take 1 tablet (325 mg total) by mouth daily with breakfast. 60 tablet 1  . methyldopa (ALDOMET) 250 MG tablet Take 1 tablet (250 mg total) by mouth 3 (three) times daily. 90 tablet 0  . prenatal vitamin w/FE, FA (NATACHEW) 29-1 MG CHEW chewable tablet Chew 1 tablet by mouth daily at 12 noon. 30 tablet 0   No current facility-administered medications for this visit.    Review of Systems Review of Systems  Constitutional: Negative.   Respiratory: Negative.   Cardiovascular: Negative.     Blood pressure 130/72, pulse 78, resp. rate 14, height 5\' 6"  (1.676 m), weight 204 lb (92.534 kg), last menstrual  period 02/21/2015.  Physical Exam Physical Exam  Constitutional: She is oriented to person, place, and time. She appears well-developed and well-nourished.  Eyes: Conjunctivae are normal. No scleral icterus.  Neck: Neck supple.  Cardiovascular: Normal rate, regular rhythm and normal heart sounds.   Pulmonary/Chest: Effort normal and breath sounds normal. Right breast exhibits no inverted nipple, no mass, no nipple discharge, no skin change and no tenderness. Left breast exhibits mass. Left breast exhibits no inverted nipple, no nipple discharge, no skin change and no tenderness.    Lymphadenopathy:    She has no cervical adenopathy.    She has no axillary adenopathy.  Neurological: She is alert and oriented to person, place, and time.  Skin: Skin is warm and dry.  Psychiatric: She has a normal mood and affect.    Data Reviewed PCP notes.  Assessment    Benign breast exam.    Plan    Changes previously noted likely secondary to hormonal flux early pregnancy. Patient encouraged to call if needed changes arise.  She should contact her PCP if arrangements for OB evaluation have not been scheduled by next week.     PCP:  Dimitri PedSowles, Krichna  Marsel Gail W 03/11/2015, 11:22 AM

## 2015-03-11 NOTE — Telephone Encounter (Signed)
Patient was put on new blood pressure medication and was told to take it 3x daily. She is pregnant and would like to know if it is okay for her to take that much? Please return call [phone]

## 2015-03-11 NOTE — Patient Instructions (Signed)
Follow up as needed.  Continue self breast exams. Call office for any new breast issues or concerns.  

## 2015-03-13 ENCOUNTER — Telehealth: Payer: Self-pay | Admitting: Family Medicine

## 2015-03-13 NOTE — Telephone Encounter (Signed)
Patient just wanted to inquire on her referral.

## 2015-03-13 NOTE — Telephone Encounter (Signed)
Patient notified by vm.

## 2015-03-13 NOTE — Telephone Encounter (Signed)
PT SAID THAT SHE NEEDS YOU TO CALL HER BACK. YOU HAD LEFT HER A VOICE MAIL.

## 2015-03-13 NOTE — Telephone Encounter (Signed)
It is safe during pregnancy, she needs to monitor her bp and send me a log of her readings.

## 2015-03-21 ENCOUNTER — Ambulatory Visit (INDEPENDENT_AMBULATORY_CARE_PROVIDER_SITE_OTHER): Payer: BLUE CROSS/BLUE SHIELD

## 2015-03-21 ENCOUNTER — Other Ambulatory Visit: Payer: Self-pay | Admitting: Certified Nurse Midwife

## 2015-03-21 VITALS — BP 127/83 | HR 92 | Wt 206.3 lb

## 2015-03-21 DIAGNOSIS — Z3490 Encounter for supervision of normal pregnancy, unspecified, unspecified trimester: Secondary | ICD-10-CM

## 2015-03-21 DIAGNOSIS — Z36 Encounter for antenatal screening of mother: Secondary | ICD-10-CM

## 2015-03-21 DIAGNOSIS — Z331 Pregnant state, incidental: Secondary | ICD-10-CM

## 2015-03-21 DIAGNOSIS — R638 Other symptoms and signs concerning food and fluid intake: Secondary | ICD-10-CM

## 2015-03-21 DIAGNOSIS — Z3687 Encounter for antenatal screening for uncertain dates: Secondary | ICD-10-CM

## 2015-03-21 DIAGNOSIS — Z113 Encounter for screening for infections with a predominantly sexual mode of transmission: Secondary | ICD-10-CM

## 2015-03-21 NOTE — Progress Notes (Signed)
Lori Gross presents for NOB nurse interview visit. Confirmation of pregnancy at Dr. Carlynn PurlSowles office on 03/07/2015. G-1.  P-0. LMP: 02/01/2015. EGA: 6.6 wks. EDD: 11/08/2015. Ultrasound ordered and to be done today, pt unsure of LMP.  Pregnancy education material explained and given. No cats in the home. NOB labs ordered. TSH/HbgA1c due to Increased BMI, HIV labs done recently (on 02/19/2015)  by Dr. Carlynn PurlSowles so it was not ordered. Drug screen was explained optional and she could opt out of tests but did not decline. Drug screen ordered. PNV encouraged. NT to discuss with provider. Pt. To follow up with provider in 5 weeks for NOB physical. Pt had viabilty and dating ultrasound today and this indicated 5.6wks instead of 6.6wks.  All questions answered.  ZIKA EXPOSURE SCREEN:  The patient has not traveled to a [place] endemic area within the past 6 months, nor has she had unprotected sex with a partner who has travelled to a [place] endemic region within the past 6 months. The patient has been advised to notify us if these factors change any time during this current pregnancy, so adequate testing and monitoring can be initiated.

## 2015-03-21 NOTE — Patient Instructions (Signed)

## 2015-03-22 LAB — CBC WITH DIFFERENTIAL/PLATELET
Basophils Absolute: 0 10*3/uL (ref 0.0–0.2)
Basos: 0 %
EOS (ABSOLUTE): 0.1 10*3/uL (ref 0.0–0.4)
Eos: 1 %
Hematocrit: 35.1 % (ref 34.0–46.6)
Hemoglobin: 11.9 g/dL (ref 11.1–15.9)
IMMATURE GRANS (ABS): 0 10*3/uL (ref 0.0–0.1)
IMMATURE GRANULOCYTES: 0 %
LYMPHS: 26 %
Lymphocytes Absolute: 1.4 10*3/uL (ref 0.7–3.1)
MCH: 32.3 pg (ref 26.6–33.0)
MCHC: 33.9 g/dL (ref 31.5–35.7)
MCV: 95 fL (ref 79–97)
MONOS ABS: 0.4 10*3/uL (ref 0.1–0.9)
Monocytes: 7 %
Neutrophils Absolute: 3.7 10*3/uL (ref 1.4–7.0)
Neutrophils: 66 %
Platelets: 265 10*3/uL (ref 150–379)
RBC: 3.68 x10E6/uL — AB (ref 3.77–5.28)
RDW: 16.6 % — ABNORMAL HIGH (ref 12.3–15.4)
WBC: 5.6 10*3/uL (ref 3.4–10.8)

## 2015-03-22 LAB — PAIN MGT SCRN (14 DRUGS), UR
AMPHETAMINE SCRN UR: NEGATIVE ng/mL
BUPRENORPHINE, URINE: NEGATIVE ng/mL
Barbiturate Screen, Ur: NEGATIVE ng/mL
Benzodiazepine Screen, Urine: NEGATIVE ng/mL
COCAINE(METAB.) SCREEN, URINE: NEGATIVE ng/mL
Cannabinoids Ur Ql Scn: POSITIVE ng/mL
Creatinine(Crt), U: 211 mg/dL (ref 20.0–300.0)
Fentanyl, Urine: NEGATIVE pg/mL
MEPERIDINE SCREEN, URINE: NEGATIVE ng/mL
METHADONE SCREEN, URINE: NEGATIVE ng/mL
OPIATE SCRN UR: NEGATIVE ng/mL
OXYCODONE+OXYMORPHONE UR QL SCN: NEGATIVE ng/mL
PCP Scrn, Ur: NEGATIVE ng/mL
PROPOXYPHENE SCREEN: NEGATIVE ng/mL
Ph of Urine: 6.3 (ref 4.5–8.9)
TRAMADOL UR QL SCN: NEGATIVE ng/mL

## 2015-03-22 LAB — RH TYPE: RH TYPE: NEGATIVE

## 2015-03-22 LAB — HEMOGLOBIN A1C
Est. average glucose Bld gHb Est-mCnc: 97 mg/dL
Hgb A1c MFr Bld: 5 % (ref 4.8–5.6)

## 2015-03-22 LAB — ANTIBODY SCREEN: Antibody Screen: NEGATIVE

## 2015-03-22 LAB — URINALYSIS, ROUTINE W REFLEX MICROSCOPIC
Bilirubin, UA: NEGATIVE
GLUCOSE, UA: NEGATIVE
Ketones, UA: NEGATIVE
LEUKOCYTES UA: NEGATIVE
NITRITE UA: NEGATIVE
Protein, UA: NEGATIVE
RBC, UA: NEGATIVE
Specific Gravity, UA: 1.024 (ref 1.005–1.030)
Urobilinogen, Ur: 0.2 mg/dL (ref 0.2–1.0)
pH, UA: 6.5 (ref 5.0–7.5)

## 2015-03-22 LAB — TSH: TSH: 1.24 u[IU]/mL (ref 0.450–4.500)

## 2015-03-22 LAB — ABO

## 2015-03-22 LAB — RPR: RPR: NONREACTIVE

## 2015-03-22 LAB — HEPATITIS B SURFACE ANTIGEN: Hepatitis B Surface Ag: NEGATIVE

## 2015-03-22 LAB — VARICELLA ZOSTER ANTIBODY, IGM: Varicella IgM: <0.91 index (ref 0.00–0.90)

## 2015-03-22 LAB — RUBELLA ANTIBODY, IGM: Rubella IgM: <20 AU/mL (ref 0.0–19.9)

## 2015-03-23 LAB — URINE CULTURE, OB REFLEX

## 2015-03-23 LAB — CULTURE, OB URINE

## 2015-03-23 LAB — GC/CHLAMYDIA PROBE AMP
CHLAMYDIA, DNA PROBE: NEGATIVE
Neisseria gonorrhoeae by PCR: NEGATIVE

## 2015-03-25 ENCOUNTER — Encounter: Payer: Self-pay | Admitting: Certified Nurse Midwife

## 2015-03-25 DIAGNOSIS — R825 Elevated urine levels of drugs, medicaments and biological substances: Secondary | ICD-10-CM | POA: Insufficient documentation

## 2015-03-26 ENCOUNTER — Encounter: Payer: Self-pay | Admitting: Certified Nurse Midwife

## 2015-03-26 DIAGNOSIS — O09899 Supervision of other high risk pregnancies, unspecified trimester: Secondary | ICD-10-CM | POA: Insufficient documentation

## 2015-03-26 DIAGNOSIS — Z283 Underimmunization status: Secondary | ICD-10-CM

## 2015-03-26 DIAGNOSIS — Z2839 Other underimmunization status: Secondary | ICD-10-CM | POA: Insufficient documentation

## 2015-04-09 ENCOUNTER — Encounter: Payer: Self-pay | Admitting: Family Medicine

## 2015-04-09 ENCOUNTER — Ambulatory Visit (INDEPENDENT_AMBULATORY_CARE_PROVIDER_SITE_OTHER): Payer: BLUE CROSS/BLUE SHIELD | Admitting: Family Medicine

## 2015-04-09 VITALS — BP 136/88 | HR 110 | Temp 97.5°F | Resp 18 | Ht 66.0 in | Wt 205.9 lb

## 2015-04-09 DIAGNOSIS — I10 Essential (primary) hypertension: Secondary | ICD-10-CM | POA: Diagnosis not present

## 2015-04-09 DIAGNOSIS — Z3201 Encounter for pregnancy test, result positive: Secondary | ICD-10-CM | POA: Diagnosis not present

## 2015-04-09 DIAGNOSIS — E611 Iron deficiency: Secondary | ICD-10-CM | POA: Diagnosis not present

## 2015-04-09 MED ORDER — METHYLDOPA 250 MG PO TABS
250.0000 mg | ORAL_TABLET | Freq: Three times a day (TID) | ORAL | Status: DC
Start: 2015-04-09 — End: 2015-06-14

## 2015-04-09 NOTE — Progress Notes (Signed)
Name: Lori Gross   MRN: [id number]    DOB: 1996-03-17   Date:04/09/2015       Progress Note  Subjective  Chief Complaint  Chief Complaint  Patient presents with  . Follow-up    1 month  . Hypertension    HPI  HTN: also pregnant, medication was changed to Methyldopa on her last visit in November, but it was very expensive and she has only been taking one pill daily instead of three pills daily. She now has Medicaid pregnancy plan and it should not cost her more than $3.00 per month.  She is willing to take it as prescribed now. She states she feels tired, but not other problems. BP has been still in the 130's/80's or 90's. No headaches, chest pain or swelling.   Patient Active Problem List   Diagnosis Date Noted  . Maternal varicella, non-immune 03/26/2015  . Positive urine drug screen 03/25/2015  . Left breast mass 03/11/2015  . Dysmenorrhea 10/08/2014  . Essential (primary) hypertension 10/08/2014  . 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 Microalbuminuria 10/08/2014  . Obesity (BMI 30.0-34.9) 10/08/2014  . Migraine without aura and responsive to treatment 10/08/2014    No past surgical history on file.  Family History  Problem Relation Age of Onset  . Hypertension Mother   . Cancer Mother 69    Common Bile Duct  . Hypertension Sister   . Hypertension Brother   . Diabetes Paternal Grandmother   . Cancer Paternal Grandmother 51    brain cancer  . Diabetes Paternal Grandfather   . Hypertension Father   . Cancer Father 75    kidney cancer    Social History   Social History  . Marital Status: Single    Spouse Name: N/A  . Number of Children: N/A  . Years of Education: N/A   Occupational History  . Not on file.   Social History Main Topics  . Smoking status: Never Smoker   . Smokeless tobacco: Never Used  . Alcohol Use: No  . Drug Use: No  . Sexual Activity:    Partners: Male    Birth Control/ Protection: Pill   Other Topics Concern  . Not on file    Social History Narrative     Current outpatient prescriptions:  .  ferrous sulfate 325 (65 FE) MG tablet, Take 1 tablet (325 mg total) by mouth daily with breakfast., Disp: 60 tablet, Rfl: 1 .  methyldopa (ALDOMET) 250 MG tablet, Take 1 tablet (250 mg total) by mouth 3 (three) times daily., Disp: 90 tablet, Rfl: 0 .  prenatal vitamin w/FE, FA (NATACHEW) 29-1 MG CHEW chewable tablet, Chew 1 tablet by mouth daily at 12 noon., Disp: 30 tablet, Rfl: 0  No Known Allergies   ROS  Ten systems reviewed and is negative except as mentioned in HPI   Objective  Filed Vitals:   04/09/15 0942  BP: 136/88  Pulse: 110  Temp: 97.5 F (36.4 C)  TempSrc: Oral  Resp: 18  Height: 5\' 6"  (1.676 m)  Weight: 205 lb 14.4 oz (93.396 kg)  SpO2: 99%    Body mass index is 33.25 kg/(m^2).  Physical Exam  Constitutional: Patient appears well-developed and well-nourished. Obese  No distress.  HEENT: head atraumatic, normocephalic, pupils equal and reactive to light, neck supple, throat within normal limits Cardiovascular: Normal rate, regular rhythm and normal heart sounds.  No murmur heard. No BLE edema. Pulmonary/Chest: Effort normal and breath sounds normal. No respiratory distress. Abdominal: Soft.  There is no tenderness. Psychiatric: Patient has a normal mood and affect. behavior is normal. Judgment and thought content normal.  Recent Results (from the past 2160 hour(s))  Lipid panel     Status: Abnormal   Collection Time: 02/19/15 10:40 AM  Result Value Ref Range   Cholesterol, Total 200 (H) 100 - 199 mg/dL   Triglycerides 191139 0 - 149 mg/dL   HDL 61 >47>39 mg/dL    Comment: According to ATP-III Guidelines, HDL-C >59 mg/dL is considered a negative risk factor for CHD.    VLDL Cholesterol Cal 28 5 - 40 mg/dL   LDL Calculated 829111 (H) 0 - 99 mg/dL   Chol/HDL Ratio 3.3 0.0 - 4.4 ratio units    Comment:                                   T. Chol/HDL Ratio                                              Men  Women                               1/2 Avg.Risk  3.4    3.3                                   Avg.Risk  5.0    4.4                                2X Avg.Risk  9.6    7.1                                3X Avg.Risk 23.4   11.0   Comprehensive metabolic panel     Status: None   Collection Time: 02/19/15 10:40 AM  Result Value Ref Range   Glucose 79 65 - 99 mg/dL   BUN 10 6 - 20 mg/dL   Creatinine, Ser 5.620.73 0.57 - 1.00 mg/dL   GFR calc non Af Amer 118 >59 mL/min/1.73   GFR calc Af Amer 136 >59 mL/min/1.73   BUN/Creatinine Ratio 14 8 - 20   Sodium 139 136 - 144 mmol/L   Potassium 3.7 3.5 - 5.2 mmol/L   Chloride 100 97 - 106 mmol/L   CO2 22 18 - 29 mmol/L   Calcium 9.4 8.7 - 10.2 mg/dL   Total Protein 6.5 6.0 - 8.5 g/dL   Albumin 4.2 3.5 - 5.5 g/dL   Globulin, Total 2.3 1.5 - 4.5 g/dL   Albumin/Globulin Ratio 1.8 1.1 - 2.5   Bilirubin Total 0.5 0.0 - 1.2 mg/dL   Alkaline Phosphatase 73 39 - 117 IU/L   AST 13 0 - 40 IU/L   ALT 10 0 - 32 IU/L  CBC with Differential/Platelet     Status: Abnormal   Collection Time: 02/19/15 10:40 AM  Result Value Ref Range   WBC 5.7 3.4 - 10.8 x10E3/uL   RBC 3.61 (L) 3.77 - 5.28 x10E6/uL   Hemoglobin 11.2 11.1 - 15.9 g/dL   Hematocrit  33.4 (L) 34.0 - 46.6 %   MCV 93 79 - 97 fL   MCH 31.0 26.6 - 33.0 pg   MCHC 33.5 31.5 - 35.7 g/dL   RDW 16.1 09.6 - 04.5 %   Platelets 244 150 - 379 x10E3/uL   Neutrophils 57 %   Lymphs 33 %   Monocytes 8 %   Eos 2 %   Basos 0 %   Neutrophils Absolute 3.3 1.4 - 7.0 x10E3/uL   Lymphocytes Absolute 1.9 0.7 - 3.1 x10E3/uL   Monocytes Absolute 0.4 0.1 - 0.9 x10E3/uL   EOS (ABSOLUTE) 0.1 0.0 - 0.4 x10E3/uL   Basophils Absolute 0.0 0.0 - 0.2 x10E3/uL   Immature Granulocytes 0 %   Immature Grans (Abs) 0.0 0.0 - 0.1 x10E3/uL  HIV antibody     Status: None   Collection Time: 02/19/15 10:40 AM  Result Value Ref Range   HIV Screen 4th Generation wRfx Non Reactive Non Reactive  Pap IG and  Chlamydia/Gonococcus, NAA     Status: None   Collection Time: 02/21/15 12:00 AM  Result Value Ref Range   DIAGNOSIS: Comment     Comment: NEGATIVE FOR INTRAEPITHELIAL LESION AND MALIGNANCY.   Specimen adequacy: Comment     Comment: Satisfactory for evaluation. Endocervical and/or squamous metaplastic cells (endocervical component) are present.    CLINICIAN PROVIDED ICD10: Comment     Comment: Z12.4   Performed by: Comment     Comment: Shawanda Long, Cytotechnologist (ASCP)   PAP SMEAR COMMENT .    Note: Comment     Comment: The Pap smear is a screening test designed to aid in the detection of premalignant and malignant conditions of the uterine cervix.  It is not a diagnostic procedure and should not be used as the sole means of detecting cervical cancer.  Both false-positive and false-negative reports do occur.    Test Methodology Comment     Comment: This liquid based ThinPrep(R) pap test was screened with the use of an image guided system.   Iron and TIBC     Status: None   Collection Time: 02/22/15  9:29 AM  Result Value Ref Range   Total Iron Binding Capacity 448 250 - 450 ug/dL   UIBC 409 811 - 914 ug/dL   Iron 782 27 - 956 ug/dL   Iron Saturation 23 15 - 55 %  Ferritin     Status: Abnormal   Collection Time: 02/22/15  9:29 AM  Result Value Ref Range   Ferritin 9 (L) 15 - 150 ng/mL  HM PAP SMEAR     Status: None   Collection Time: 02/24/15 12:00 AM  Result Value Ref Range   HM Pap smear negative     Comment: Dr Carlynn Purl  POCT urine pregnancy     Status: Abnormal   Collection Time: 03/07/15  9:24 AM  Result Value Ref Range   Preg Test, Ur Positive (A) Negative  ABO     Status: None   Collection Time: 03/21/15 10:43 AM  Result Value Ref Range   ABO Grouping A   Antibody screen     Status: None   Collection Time: 03/21/15 10:43 AM  Result Value Ref Range   Antibody Screen Negative Negative  CBC with Differential/Platelet     Status: Abnormal   Collection Time:  03/21/15 10:43 AM  Result Value Ref Range   WBC 5.6 3.4 - 10.8 x10E3/uL   RBC 3.68 (L) 3.77 - 5.28 x10E6/uL   Hemoglobin 11.9 11.1 -  15.9 g/dL   Hematocrit 16.1 09.6 - 46.6 %   MCV 95 79 - 97 fL   MCH 32.3 26.6 - 33.0 pg   MCHC 33.9 31.5 - 35.7 g/dL   RDW 04.5 (H) 40.9 - 81.1 %   Platelets 265 150 - 379 x10E3/uL   Neutrophils 66 %   Lymphs 26 %   Monocytes 7 %   Eos 1 %   Basos 0 %   Neutrophils Absolute 3.7 1.4 - 7.0 x10E3/uL   Lymphocytes Absolute 1.4 0.7 - 3.1 x10E3/uL   Monocytes Absolute 0.4 0.1 - 0.9 x10E3/uL   EOS (ABSOLUTE) 0.1 0.0 - 0.4 x10E3/uL   Basophils Absolute 0.0 0.0 - 0.2 x10E3/uL   Immature Granulocytes 0 %   Immature Grans (Abs) 0.0 0.0 - 0.1 x10E3/uL  Hemoglobin A1c     Status: None   Collection Time: 03/21/15 10:43 AM  Result Value Ref Range   Hgb A1c MFr Bld 5.0 4.8 - 5.6 %    Comment:          Pre-diabetes: 5.7 - 6.4          Diabetes: >6.4          Glycemic control for adults with diabetes: <7.0    Est. average glucose Bld gHb Est-mCnc 97 mg/dL  Hepatitis B surface antigen     Status: None   Collection Time: 03/21/15 10:43 AM  Result Value Ref Range   Hepatitis B Surface Ag Negative Negative  Rh Type     Status: None   Collection Time: 03/21/15 10:43 AM  Result Value Ref Range   Rh Factor Negative     Comment: Please note: Prior records for this patient's ABO / Rh type are not available for additional verification.   RPR     Status: None   Collection Time: 03/21/15 10:43 AM  Result Value Ref Range   RPR Ser Ql Non Reactive Non Reactive  Rubella antibody, IgM     Status: None   Collection Time: 03/21/15 10:43 AM  Result Value Ref Range   Rubella IgM <20.0 0.0 - 19.9 AU/mL    Comment:                              Negative            <20.0                              Equivocal     20.0 - 24.9                              Positive            >24.9   TSH     Status: None   Collection Time: 03/21/15 10:43 AM  Result Value Ref Range   TSH  1.240 0.450 - 4.500 uIU/mL  Varicella zoster antibody, IgM     Status: None   Collection Time: 03/21/15 10:43 AM  Result Value Ref Range   Varicella IgM <0.91 0.00 - 0.90 index    Comment:                                Negative          <0.91  Borderline  0.91 - 1.09                                Positive          >1.09   Culture, OB Urine     Status: None   Collection Time: 03/21/15 11:02 AM  Result Value Ref Range   Urine Culture, OB Final report   GC/Chlamydia Probe Amp     Status: None   Collection Time: 03/21/15 11:02 AM  Result Value Ref Range   Chlamydia trachomatis, NAA Negative Negative   Neisseria gonorrhoeae by PCR Negative Negative  Pain Mgt Scrn (14 Drugs), Ur     Status: None   Collection Time: 03/21/15 11:02 AM  Result Value Ref Range   Amphetamine Screen, Ur Negative Cutoff=1000 ng/mL   Barbiturate Screen, Ur Negative Cutoff=200 ng/mL   Benzodiazepine Screen, Urine Negative Cutoff=200 ng/mL   Cannabinoids Ur Ql Scn Positive Cutoff=20 ng/mL   Cocaine(Metab.)Screen, Urine Negative Cutoff=300 ng/mL   Opiate Scrn, Ur Negative Cutoff=300 ng/mL    Comment: Opiate test includes Codeine, Morphine, Hydromorphone, Hydrocodone.   Oxycodone+Oxymorphone Ur Ql Scn Negative Cutoff=100 ng/mL    Comment: Test includes Oxycodone and Oxymorphone   PCP Scrn, Ur Negative Cutoff=25 ng/mL   Methadone Scn, Ur Negative Cutoff=300 ng/mL   Propoxyphene, Screen Negative Cutoff=300 ng/mL   Meperidine Screen, Urine Negative Cutoff=200 ng/mL    Comment: This test was developed and its performance characteristics determined by [facility]. It has not been cleared or approved by the Food and Drug Administration.    Fentanyl, Urine Negative Cutoff=2000 pg/mL    Comment: Test includes Fentanyl and Norfentanyl This test was developed and its performance characteristics determined by [facility]. It has not been cleared or approved by the Food and Drug Administration.     Tramadol Ur Ql Scn Negative Cutoff=200 ng/mL   Buprenorphine, Urine Negative Cutoff=10 ng/mL   Creatinine(Crt), U 211.0 20.0 - 300.0 mg/dL   Ph of Urine 6.3 4.5 - 8.9  Urinalysis, Routine w reflex microscopic (not at [facility])     Status: Abnormal   Collection Time: 03/21/15 11:02 AM  Result Value Ref Range   Specific Gravity, UA 1.024 1.005 - 1.030   pH, UA 6.5 5.0 - 7.5   Color, UA Yellow Yellow   Appearance Ur Cloudy (A) Clear   Leukocytes, UA Negative Negative   Protein, UA Negative Negative/Trace   Glucose, UA Negative Negative   Ketones, UA Negative Negative   RBC, UA Negative Negative   Bilirubin, UA Negative Negative   Urobilinogen, Ur 0.2 0.2 - 1.0 mg/dL   Nitrite, UA Negative Negative   Microscopic Examination Comment     Comment: Microscopic not indicated and not performed.  Result     Status: None   Collection Time: 03/21/15 11:02 AM  Result Value Ref Range   Result 1 Comment     Comment: Mixed urogenital flora 25,000-50,000 colony forming units per mL     PHQ2/9: Depression screen [facility] 2/9 02/21/2015 12/11/2014 10/11/2014  Decreased Interest 0 0 0  Down, Depressed, Hopeless 0 0 0  PHQ - 2 Score 0 0 0     Fall Risk: Fall Risk  02/21/2015 12/11/2014 10/11/2014  Falls in the past year? No No No     Assessment & Plan  1. Essential (primary) hypertension  Explained importance of taking medication as prescribed to avoid intra uterine growth retardation  - methyldopa (ALDOMET)  250 MG tablet; Take 1 tablet (250 mg total) by mouth 3 (three) times daily.  Dispense: 90 tablet; Refill: 0  2. Positive pregnancy test  - methyldopa (ALDOMET) 250 MG tablet; Take 1 tablet (250 mg total) by mouth 3 (three) times daily.  Dispense: 90 tablet; Refill: 0  3. Iron deficiency  She just started supplementation today, no anemia

## 2015-04-21 NOTE — L&D Delivery Note (Addendum)
Delivery Summary for Lori Gross  Labor Events:   Preterm labor:   Rupture date:   Rupture time:   Rupture type: Artificial  Fluid Color: Pink  Induction:   Augmentation:   Complications:   Cervical ripening:          Delivery:   Episiotomy:   Lacerations:   Repair suture:   Repair # of packets:   Blood loss (ml): 200   Information for the patient's newborn:  Lori Gross, Lori Gross [[id number]][[id number]]    Delivery 11/08/2015 7:44 AM by  Vaginal, Spontaneous Delivery Sex:  female Gestational Age: 4147w0d Delivery Clinician:  Hildred LaserAnika Donie Gross Living?:         APGARS  One minute Five minutes Ten minutes  Skin color: 0   1      Heart rate: 2   2      Grimace: 2   2      Muscle tone: 1   2      Breathing: 2   2      Totals: 7  9      Presentation/position: Vertex  Right Occiput Anterior Resuscitation:   Cord information:    Disposition of cord blood:     Blood gases sent?  Complications:   Placenta: Delivered: 11/08/2015 7:50 AM  Spontaneous   appearance Newborn Measurements: Weight: 6 lb 13 oz (3090 g)  Height:    Head circumference:    Chest circumference:    Other providers:  Lori Gross  Additional  information: Forceps:   Vacuum:   Breech:   Observed anomalies        Operative Delivery Note At 7:44 AM a viable and healthy female was delivered via Vaginal, Spontaneous Delivery.  Presentation: vertex; Position: Right,, Occiput,, Anterior; Station: +3.  Verbal consent: obtained from patient.  Risks and benefits discussed in detail.  Risks include, but are not limited to the risks of anesthesia, bleeding, infection, damage to maternal tissues, fetal cephalhematoma.  There is also the risk of inability to effect vaginal delivery of the head, or shoulder dystocia that cannot be resolved by established maneuvers, leading to the need for emergency cesarean section.  APGAR: 7, 9; weight 6 lb 13 oz (3090 g).   Placenta status: intact, Spontaneous.   Cord:  with the  following complications: Tight nuchal cord x 1, non-reducible .  Cord pH: not obtained.  Anesthesia: Epidural  Instruments: Kiwi Vacuum. Applied for terminal fetal bradycardia (FHT 40s-80s).  No pop-offs.  Applied for a total of 3 pushes.  Episiotomy:  None Lacerations:  1st degree vaginal Suture Repair: 3.0 vicryl Est. Blood Loss (mL):  250  Mom to postpartum.  Baby to Couplet care / Skin to Skin.  Lori Gross 11/08/2015, 8:22 AM

## 2015-04-23 ENCOUNTER — Ambulatory Visit (INDEPENDENT_AMBULATORY_CARE_PROVIDER_SITE_OTHER): Payer: BLUE CROSS/BLUE SHIELD | Admitting: Certified Nurse Midwife

## 2015-04-23 ENCOUNTER — Encounter: Payer: Self-pay | Admitting: Certified Nurse Midwife

## 2015-04-23 VITALS — BP 136/96 | Wt 208.2 lb

## 2015-04-23 DIAGNOSIS — Z36 Encounter for antenatal screening of mother: Secondary | ICD-10-CM

## 2015-04-23 DIAGNOSIS — I10 Essential (primary) hypertension: Secondary | ICD-10-CM

## 2015-04-23 DIAGNOSIS — Z2839 Other underimmunization status: Secondary | ICD-10-CM

## 2015-04-23 DIAGNOSIS — R825 Elevated urine levels of drugs, medicaments and biological substances: Secondary | ICD-10-CM

## 2015-04-23 DIAGNOSIS — E669 Obesity, unspecified: Secondary | ICD-10-CM

## 2015-04-23 DIAGNOSIS — O09899 Supervision of other high risk pregnancies, unspecified trimester: Secondary | ICD-10-CM

## 2015-04-23 DIAGNOSIS — O99011 Anemia complicating pregnancy, first trimester: Secondary | ICD-10-CM

## 2015-04-23 DIAGNOSIS — Z369 Encounter for antenatal screening, unspecified: Secondary | ICD-10-CM

## 2015-04-23 DIAGNOSIS — O9989 Other specified diseases and conditions complicating pregnancy, childbirth and the puerperium: Secondary | ICD-10-CM

## 2015-04-23 DIAGNOSIS — O0991 Supervision of high risk pregnancy, unspecified, first trimester: Secondary | ICD-10-CM

## 2015-04-23 DIAGNOSIS — Z283 Underimmunization status: Secondary | ICD-10-CM

## 2015-04-23 DIAGNOSIS — E66811 Obesity, class 1: Secondary | ICD-10-CM

## 2015-04-23 DIAGNOSIS — Z23 Encounter for immunization: Secondary | ICD-10-CM

## 2015-04-23 LAB — POCT URINALYSIS DIPSTICK
BILIRUBIN UA: NEGATIVE
Blood, UA: NEGATIVE
GLUCOSE UA: NEGATIVE
KETONES UA: NEGATIVE
Leukocytes, UA: NEGATIVE
Nitrite, UA: NEGATIVE
Protein, UA: NEGATIVE
SPEC GRAV UA: 1.015
UROBILINOGEN UA: NEGATIVE
pH, UA: 6.5

## 2015-04-23 MED ORDER — INFLUENZA VAC SPLIT QUAD 0.5 ML IM SUSY
0.5000 mL | PREFILLED_SYRINGE | Freq: Once | INTRAMUSCULAR | Status: AC
  Administered 2015-04-23: 0.5 mL via INTRAMUSCULAR

## 2015-04-23 NOTE — Patient Instructions (Signed)
First Trimester of Pregnancy The first trimester of pregnancy is from week 1 until the end of week 12 (months 1 through 3). A week after a sperm fertilizes an egg, the egg will implant on the wall of the uterus. This embryo will begin to develop into a baby. Genes from you and your partner are forming the baby. The female genes determine whether the baby is a boy or a girl. At 6-8 weeks, the eyes and face are formed, and the heartbeat can be seen on ultrasound. At the end of 12 weeks, all the baby's organs are formed.  Now that you are pregnant, you will want to do everything you can to have a healthy baby. Two of the most important things are to get good prenatal care and to follow your health care provider's instructions. Prenatal care is all the medical care you receive before the baby's birth. This care will help prevent, find, and treat any problems during the pregnancy and childbirth. BODY CHANGES Your body goes through many changes during pregnancy. The changes vary from woman to woman.   You may gain or lose a couple of pounds at first.  You may feel sick to your stomach (nauseous) and throw up (vomit). If the vomiting is uncontrollable, call your health care provider.  You may tire easily.  You may develop headaches that can be relieved by medicines approved by your health care provider.  You may urinate more often. Painful urination may mean you have a bladder infection.  You may develop heartburn as a result of your pregnancy.  You may develop constipation because certain hormones are causing the muscles that push waste through your intestines to slow down.  You may develop hemorrhoids or swollen, bulging veins (varicose veins).  Your breasts may begin to grow larger and become tender. Your nipples may stick out more, and the tissue that surrounds them (areola) may become darker.  Your gums may bleed and may be sensitive to brushing and flossing.  Dark spots or blotches (chloasma,  mask of pregnancy) may develop on your face. This will likely fade after the baby is born.  Your menstrual periods will stop.  You may have a loss of appetite.  You may develop cravings for certain kinds of food.  You may have changes in your emotions from day to day, such as being excited to be pregnant or being concerned that something may go wrong with the pregnancy and baby.  You may have more vivid and strange dreams.  You may have changes in your hair. These can include thickening of your hair, rapid growth, and changes in texture. Some women also have hair loss during or after pregnancy, or hair that feels dry or thin. Your hair will most likely return to normal after your baby is born. WHAT TO EXPECT AT YOUR PRENATAL VISITS During a routine prenatal visit:  You will be weighed to make sure you and the baby are growing normally.  Your blood pressure will be taken.  Your abdomen will be measured to track your baby's growth.  The fetal heartbeat will be listened to starting around week 10 or 12 of your pregnancy.  Test results from any previous visits will be discussed. Your health care provider may ask you:  How you are feeling.  If you are feeling the baby move.  If you have had any abnormal symptoms, such as leaking fluid, bleeding, severe headaches, or abdominal cramping.  If you are using any tobacco products,   including cigarettes, chewing tobacco, and electronic cigarettes.  If you have any questions. Other tests that may be performed during your first trimester include:  Blood tests to find your blood type and to check for the presence of any previous infections. They will also be used to check for low iron levels (anemia) and Rh antibodies. Later in the pregnancy, blood tests for diabetes will be done along with other tests if problems develop.  Urine tests to check for infections, diabetes, or protein in the urine.  An ultrasound to confirm the proper growth  and development of the baby.  An amniocentesis to check for possible genetic problems.  Fetal screens for spina bifida and Down syndrome.  You may need other tests to make sure you and the baby are doing well.  HIV (human immunodeficiency virus) testing. Routine prenatal testing includes screening for HIV, unless you choose not to have this test. HOME CARE INSTRUCTIONS  Medicines  Follow your health care provider's instructions regarding medicine use. Specific medicines may be either safe or unsafe to take during pregnancy.  Take your prenatal vitamins as directed.  If you develop constipation, try taking a stool softener if your health care provider approves. Diet  Eat regular, well-balanced meals. Choose a variety of foods, such as meat or vegetable-based protein, fish, milk and low-fat dairy products, vegetables, fruits, and whole grain breads and cereals. Your health care provider will help you determine the amount of weight gain that is right for you.  Avoid raw meat and uncooked cheese. These carry germs that can cause birth defects in the baby.  Eating four or five small meals rather than three large meals a day may help relieve nausea and vomiting. If you start to feel nauseous, eating a few soda crackers can be helpful. Drinking liquids between meals instead of during meals also seems to help nausea and vomiting.  If you develop constipation, eat more high-fiber foods, such as fresh vegetables or fruit and whole grains. Drink enough fluids to keep your urine clear or pale yellow. Activity and Exercise  Exercise only as directed by your health care provider. Exercising will help you:  Control your weight.  Stay in shape.  Be prepared for labor and delivery.  Experiencing pain or cramping in the lower abdomen or low back is a good sign that you should stop exercising. Check with your health care provider before continuing normal exercises.  Try to avoid standing for long  periods of time. Move your legs often if you must stand in one place for a long time.  Avoid heavy lifting.  Wear low-heeled shoes, and practice good posture.  You may continue to have sex unless your health care provider directs you otherwise. Relief of Pain or Discomfort  Wear a good support bra for breast tenderness.   Take warm sitz baths to soothe any pain or discomfort caused by hemorrhoids. Use hemorrhoid cream if your health care provider approves.   Rest with your legs elevated if you have leg cramps or low back pain.  If you develop varicose veins in your legs, wear support hose. Elevate your feet for 15 minutes, 3-4 times a day. Limit salt in your diet. Prenatal Care  Schedule your prenatal visits by the twelfth week of pregnancy. They are usually scheduled monthly at first, then more often in the last 2 months before delivery.  Write down your questions. Take them to your prenatal visits.  Keep all your prenatal visits as directed by your   health care provider. Safety  Wear your seat belt at all times when driving.  Make a list of emergency phone numbers, including numbers for family, friends, the hospital, and police and fire departments. General Tips  Ask your health care provider for a referral to a local prenatal education class. Begin classes no later than at the beginning of month 6 of your pregnancy.  Ask for help if you have counseling or nutritional needs during pregnancy. Your health care provider can offer advice or refer you to specialists for help with various needs.  Do not use hot tubs, steam rooms, or saunas.  Do not douche or use tampons or scented sanitary pads.  Do not cross your legs for long periods of time.  Avoid cat litter boxes and soil used by cats. These carry germs that can cause birth defects in the baby and possibly loss of the fetus by miscarriage or stillbirth.  Avoid all smoking, herbs, alcohol, and medicines not prescribed by  your health care provider. Chemicals in these affect the formation and growth of the baby.  Do not use any tobacco products, including cigarettes, chewing tobacco, and electronic cigarettes. If you need help quitting, ask your health care provider. You may receive counseling support and other resources to help you quit.  Schedule a dentist appointment. At home, brush your teeth with a soft toothbrush and be gentle when you floss. SEEK MEDICAL CARE IF:   You have dizziness.  You have mild pelvic cramps, pelvic pressure, or nagging pain in the abdominal area.  You have persistent nausea, vomiting, or diarrhea.  You have a bad smelling vaginal discharge.  You have pain with urination.  You notice increased swelling in your face, hands, legs, or ankles. SEEK IMMEDIATE MEDICAL CARE IF:   You have a fever.  You are leaking fluid from your vagina.  You have spotting or bleeding from your vagina.  You have severe abdominal cramping or pain.  You have rapid weight gain or loss.  You vomit blood or material that looks like coffee grounds.  You are exposed to German measles and have never had them.  You are exposed to fifth disease or chickenpox.  You develop a severe headache.  You have shortness of breath.  You have any kind of trauma, such as from a fall or a car accident.   This information is not intended to replace advice given to you by your health care provider. Make sure you discuss any questions you have with your health care provider.   Document Released: 03/31/2001 Document Revised: 04/27/2014 Document Reviewed: 02/14/2013 Elsevier Interactive Patient Education 2016 [employer]  

## 2015-04-23 NOTE — Progress Notes (Signed)
NEW OB HISTORY AND PHYSICAL  SUBJECTIVE:       Lori Gross is a 20 y.o. G1P0 female, Patient's last menstrual period was 02/01/2015 (approximate)., Estimated Date of Delivery: 11/15/15, [redacted]w[redacted]d, presents today for establishment of Prenatal Care. She has no unusual complaints.  She is hypertensive and taking Aldomet 250 mg TID and anemic taking ferrous sulfate 325 mg daily      Gynecologic History Patient's last menstrual period was 02/01/2015 (approximate). . Contraception: none Last Pap: 02/24/15. Results were: normal  Obstetric History OB History  Gravida Para Term Preterm AB SAB TAB Ectopic Multiple Living  1             # Outcome Date GA Lbr Len/2nd Weight Sex Delivery Anes PTL Lv  1 Current             Obstetric Comments  Menstrual age: 2    Age 1st Pregnancy: 35    Past Medical History  Diagnosis Date  . Grieving   . Hypertension   . Microalbuminuria   . Benign headache   . Migraine without aura and without status migrainosus, not intractable   . Dysmenorrhea   . Obesity   . Urticaria   . Marijuana use 2016    positive UDS    History reviewed. No pertinent past surgical history.  Current Outpatient Prescriptions on File Prior to Visit  Medication Sig Dispense Refill  . ferrous sulfate 325 (65 FE) MG tablet Take 1 tablet (325 mg total) by mouth daily with breakfast. 60 tablet 1  . methyldopa (ALDOMET) 250 MG tablet Take 1 tablet (250 mg total) by mouth 3 (three) times daily. 90 tablet 0  . prenatal vitamin w/FE, FA (NATACHEW) 29-1 MG CHEW chewable tablet Chew 1 tablet by mouth daily at 12 noon. 30 tablet 0   No current facility-administered medications on file prior to visit.    No Known Allergies  Social History   Social History  . Marital Status: Single    Spouse Name: N/A  . Number of Children: N/A  . Years of Education: N/A   Occupational History  . Not on file.   Social History Main Topics  . Smoking status: Never Smoker   . Smokeless  tobacco: Never Used  . Alcohol Use: No  . Drug Use: No  . Sexual Activity:    Partners: Male    Birth Control/ Protection: Pill   Other Topics Concern  . Not on file   Social History Narrative    Family History  Problem Relation Age of Onset  . Hypertension Mother   . Cancer Mother 47    Common Bile Duct  . Hypertension Sister   . Hypertension Brother   . Diabetes Paternal Grandmother   . Cancer Paternal Grandmother 88    brain cancer  . Diabetes Paternal Grandfather   . Hypertension Father   . Cancer Father 25    kidney cancer    The following portions of the patient's history were reviewed and updated as appropriate: allergies, current medications, past OB history, past medical history, past surgical history, past family history, past social history, and problem list.    OBJECTIVE: Initial Physical Exam (New OB)  GENERAL APPEARANCE: alert, well appearing, in no apparent distress, overweight HEAD: normocephalic, atraumatic MOUTH: mucous membranes moist, pharynx normal without lesions and dental hygiene good THYROID: no thyromegaly or masses present BREASTS: no masses noted, no significant tenderness, no palpable axillary nodes, no skin changes LUNGS: clear to auscultation, no  wheezes, rales or rhonchi, symmetric air entry HEART: regular rate and rhythm, no murmurs ABDOMEN: soft, nontender, nondistended, no abnormal masses, no epigastric pain, obese and FHT not heard EXTREMITIES: no redness or tenderness in the calves or thighs, no edema SKIN: normal coloration and turgor, no rashes LYMPH NODES: no adenopathy palpable NEUROLOGIC: alert, oriented, normal speech, no focal findings or movement disorder noted, DTRs normal and symmetrical  PELVIC EXAM EXTERNAL GENITALIA: normal appearing vulva with no masses, tenderness or lesions, shaved VAGINA: no abnormal discharge or lesions CERVIX: no lesions or cervical motion tenderness UTERUS: gravid and consistent with 10  weeks ADNEXA: no masses palpable, nontender and difficult to palpate secondary to body habitus  ASSESSMENT: High risk pregnancy due to hypertension on medication Obesity Anemia of pregnancy UDS +  PLAN: Prenatal care Reviewed with Dr Valentino Saxonherry and ok to stay on aldomet and will send to Dr Valentino Saxonherry for HTN Counseled to stop marijuana.  Patient states she stopped 03/07/15 Taking iron

## 2015-04-23 NOTE — Progress Notes (Signed)
Repeat B/P 131/101. P-96

## 2015-04-23 NOTE — Addendum Note (Signed)
Addended by: Jackquline DenmarkIDGEWAY, Tanmay Halteman W on: 04/23/2015 09:41 AM   Modules accepted: Orders

## 2015-04-23 NOTE — Progress Notes (Signed)
Flu vaccine given today. 

## 2015-04-23 NOTE — Progress Notes (Signed)
ZIKA EXPOSURE SCREEN:  The patient has not traveled to a Zika Virus endemic area within the past 6 months, nor has she had unprotected sex with a partner who has travelled to a Zika endemic region within the past 6 months. The patient has been advised to notify us if these factors change any time during this current pregnancy, so adequate testing and monitoring can be initiated.  

## 2015-04-25 LAB — PAIN MGT SCRN (14 DRUGS), UR
Amphetamine Screen, Ur: NEGATIVE ng/mL
BARBITURATE SCRN UR: NEGATIVE ng/mL
BENZODIAZEPINE SCREEN, URINE: NEGATIVE ng/mL
BUPRENORPHINE, URINE: NEGATIVE ng/mL
CREATININE(CRT), U: 139.6 mg/dL (ref 20.0–300.0)
Cannabinoids Ur Ql Scn: POSITIVE ng/mL
Cocaine(Metab.)Screen, Urine: NEGATIVE ng/mL
FENTANYL, URINE: NEGATIVE pg/mL
MEPERIDINE SCREEN, URINE: NEGATIVE ng/mL
METHADONE SCREEN, URINE: NEGATIVE ng/mL
OPIATE SCRN UR: NEGATIVE ng/mL
OXYCODONE+OXYMORPHONE UR QL SCN: NEGATIVE ng/mL
PCP Scrn, Ur: NEGATIVE ng/mL
PH UR, DRUG SCRN: 6.3 (ref 4.5–8.9)
Propoxyphene, Screen: NEGATIVE ng/mL
Tramadol Ur Ql Scn: NEGATIVE ng/mL

## 2015-05-09 ENCOUNTER — Ambulatory Visit (INDEPENDENT_AMBULATORY_CARE_PROVIDER_SITE_OTHER): Payer: BLUE CROSS/BLUE SHIELD | Admitting: Obstetrics and Gynecology

## 2015-05-09 VITALS — BP 135/80 | HR 88 | Wt 204.0 lb

## 2015-05-09 DIAGNOSIS — O161 Unspecified maternal hypertension, first trimester: Secondary | ICD-10-CM

## 2015-05-09 DIAGNOSIS — E66811 Obesity, class 1: Secondary | ICD-10-CM

## 2015-05-09 DIAGNOSIS — O99011 Anemia complicating pregnancy, first trimester: Secondary | ICD-10-CM

## 2015-05-09 DIAGNOSIS — E669 Obesity, unspecified: Secondary | ICD-10-CM

## 2015-05-09 DIAGNOSIS — Z3402 Encounter for supervision of normal first pregnancy, second trimester: Secondary | ICD-10-CM

## 2015-05-09 MED ORDER — ASPIRIN EC 81 MG PO TBEC: 81.0000 mg | DELAYED_RELEASE_TABLET | Freq: Every day | ORAL | Status: DC

## 2015-05-09 NOTE — Progress Notes (Signed)
Pt unable to void 

## 2015-05-10 LAB — COMPREHENSIVE METABOLIC PANEL
A/G RATIO: 1.8 (ref 1.1–2.5)
ALT: 13 IU/L (ref 0–32)
AST: 14 IU/L (ref 0–40)
Albumin: 4.3 g/dL (ref 3.5–5.5)
Alkaline Phosphatase: 59 IU/L (ref 39–117)
BILIRUBIN TOTAL: 0.2 mg/dL (ref 0.0–1.2)
BUN/Creatinine Ratio: 7 — ABNORMAL LOW (ref 8–20)
BUN: 4 mg/dL — ABNORMAL LOW (ref 6–20)
CO2: 21 mmol/L (ref 18–29)
CREATININE: 0.57 mg/dL (ref 0.57–1.00)
Calcium: 9.2 mg/dL (ref 8.7–10.2)
Chloride: 101 mmol/L (ref 96–106)
GFR calc Af Amer: 153 mL/min/{1.73_m2} (ref >59)
GFR, EST NON AFRICAN AMERICAN: 133 mL/min/{1.73_m2} (ref >59)
GLOBULIN, TOTAL: 2.4 g/dL (ref 1.5–4.5)
Glucose: 94 mg/dL (ref 65–99)
Potassium: 4 mmol/L (ref 3.5–5.2)
SODIUM: 138 mmol/L (ref 134–144)
Total Protein: 6.7 g/dL (ref 6.0–8.5)

## 2015-05-10 LAB — PROTEIN / CREATININE RATIO, URINE
Creatinine, Urine: 79.7 mg/dL
PROTEIN UR: 14.3 mg/dL
PROTEIN/CREAT RATIO: 179 mg/g{creat} (ref 0–200)

## 2015-05-11 NOTE — Progress Notes (Signed)
ROB: Patient doing well, denies complaints. Notes compliance with Aldomet. BPs wnl today. Baseline PIH labs ordered. Is considering Panorama vs 1st trimester screen for genetic testing.  Discussed each test and given handouts. Recommend beginning daily baby aspirin, discussed need for antenatal testing and growth scans during pregnancy. Counseled on appropriate weight gain in pregnancy. Continue iron for anemia, will assess again in 2nd trimester.

## 2015-05-13 ENCOUNTER — Ambulatory Visit: Payer: BLUE CROSS/BLUE SHIELD | Admitting: Family Medicine

## 2015-05-14 ENCOUNTER — Other Ambulatory Visit: Payer: Self-pay | Admitting: Obstetrics and Gynecology

## 2015-05-14 DIAGNOSIS — Z369 Encounter for antenatal screening, unspecified: Secondary | ICD-10-CM

## 2015-05-16 ENCOUNTER — Other Ambulatory Visit: Payer: Self-pay | Admitting: Obstetrics and Gynecology

## 2015-05-16 ENCOUNTER — Ambulatory Visit (INDEPENDENT_AMBULATORY_CARE_PROVIDER_SITE_OTHER): Payer: BLUE CROSS/BLUE SHIELD

## 2015-05-16 DIAGNOSIS — Z36 Encounter for antenatal screening of mother: Secondary | ICD-10-CM | POA: Diagnosis not present

## 2015-05-16 DIAGNOSIS — Z369 Encounter for antenatal screening, unspecified: Secondary | ICD-10-CM

## 2015-05-21 ENCOUNTER — Telehealth: Payer: Self-pay

## 2015-05-21 LAB — FIRST TRIMESTER SCREEN W/NT
CRL: 82.1 mm
DIA MoM: 0.94
DIA Value: 163.2 pg/mL
GEST AGE-COLLECT: 13.4 wk
HCG VALUE: 41.5 [IU]/mL
MATERNAL AGE AT EDD: 21.8 a
NUCHAL TRANSLUCENCY MOM: 1.44
NUCHAL TRANSLUCENCY: 1.9 mm
NUMBER OF FETUSES: 1
PAPP-A MoM: 0.8
PAPP-A Value: 774.2 ng/mL
PDF: 0
TEST RESULTS: NEGATIVE
WEIGHT: 204 [lb_av]
hCG MoM: 0.64

## 2015-05-21 NOTE — Telephone Encounter (Signed)
-----   Message from [name], MD sent at 05/21/2015  8:12 AM EST ----- Patient with normal 1st trimester screen.  Please inform.

## 2015-05-21 NOTE — Telephone Encounter (Signed)
Informed pt of negative NT.

## 2015-06-06 ENCOUNTER — Ambulatory Visit (INDEPENDENT_AMBULATORY_CARE_PROVIDER_SITE_OTHER): Payer: BLUE CROSS/BLUE SHIELD | Admitting: Obstetrics and Gynecology

## 2015-06-06 VITALS — BP 133/74 | HR 97 | Wt 206.9 lb

## 2015-06-06 DIAGNOSIS — E66811 Obesity, class 1: Secondary | ICD-10-CM

## 2015-06-06 DIAGNOSIS — Z3492 Encounter for supervision of normal pregnancy, unspecified, second trimester: Secondary | ICD-10-CM

## 2015-06-06 DIAGNOSIS — Z1379 Encounter for other screening for genetic and chromosomal anomalies: Secondary | ICD-10-CM

## 2015-06-06 DIAGNOSIS — E669 Obesity, unspecified: Secondary | ICD-10-CM

## 2015-06-06 DIAGNOSIS — O10912 Unspecified pre-existing hypertension complicating pregnancy, second trimester: Secondary | ICD-10-CM

## 2015-06-06 DIAGNOSIS — O0992 Supervision of high risk pregnancy, unspecified, second trimester: Secondary | ICD-10-CM

## 2015-06-06 LAB — POCT URINALYSIS DIPSTICK
Bilirubin, UA: NEGATIVE
Blood, UA: NEGATIVE
Glucose, UA: NEGATIVE
KETONES UA: NEGATIVE
LEUKOCYTES UA: NEGATIVE
NITRITE UA: NEGATIVE
PROTEIN UA: NEGATIVE
Spec Grav, UA: 1.01
Urobilinogen, UA: NEGATIVE
pH, UA: 8

## 2015-06-06 NOTE — Progress Notes (Signed)
ROB: Denies complaints.  Normal 1st trimester screen.  For serum AFP today.  BPs wnl today on Aldomet 250 mg BID. Continue baby aspirin. For anatomy scan next visit.  RTC in 4 weeks.

## 2015-06-12 LAB — AFP, SERUM, OPEN SPINA BIFIDA
AFP MoM: 1.52
AFP VALUE AFPOSL: 45.2 ng/mL
GEST. AGE ON COLLECTION DATE: 16.9 wk
MATERNAL AGE AT EDD: 21.8 a
OSBR Risk 1 IN: 2591
PDF: 0
TEST RESULTS AFP: NEGATIVE
WEIGHT: 206 [lb_av]

## 2015-06-13 ENCOUNTER — Telehealth: Payer: Self-pay

## 2015-06-13 NOTE — Telephone Encounter (Signed)
Called pt informed her of normal AFP results.

## 2015-06-13 NOTE — Telephone Encounter (Signed)
-----   Message from [name], MD sent at 06/12/2015  5:30 PM EST ----- Please inform of normal msAFP screen.

## 2015-06-14 ENCOUNTER — Encounter: Payer: Self-pay | Admitting: Family Medicine

## 2015-06-14 ENCOUNTER — Ambulatory Visit (INDEPENDENT_AMBULATORY_CARE_PROVIDER_SITE_OTHER): Payer: BLUE CROSS/BLUE SHIELD | Admitting: Family Medicine

## 2015-06-14 VITALS — BP 118/76 | HR 103 | Temp 98.0°F | Resp 18 | Ht 66.0 in | Wt 207.2 lb

## 2015-06-14 DIAGNOSIS — R000 Tachycardia, unspecified: Secondary | ICD-10-CM

## 2015-06-14 DIAGNOSIS — Z3201 Encounter for pregnancy test, result positive: Secondary | ICD-10-CM

## 2015-06-14 DIAGNOSIS — I10 Essential (primary) hypertension: Secondary | ICD-10-CM | POA: Diagnosis not present

## 2015-06-14 MED ORDER — METHYLDOPA 250 MG PO TABS: 250.0000 mg | ORAL_TABLET | Freq: Three times a day (TID) | ORAL | Status: DC

## 2015-06-14 NOTE — Progress Notes (Signed)
Name: Lori Gross   MRN: [id number]    DOB: October 18, 1995   Date:06/14/2015       Progress Note  Subjective  Chief Complaint  Chief Complaint  Patient presents with  . Hypertension    patient has been checking her BP and it was 144/95 yesterday    . Tachycardia    patient stated heart rated has been up to 114. some SOB & tightness at times.    HPI  HTN: bp was high prior to pregnancy. Diagnosed with HTN in her teen years. Significant family history of HTN. BP during OB visits has been at goal, it was high yesterday am at home, but very good today during office visit. She has intermittent palpitation and some chest tightness that only lasts at most 2 minutes. No diaphoresis during episodes. Pain does not radiate.    Patient Active Problem List   Diagnosis Date Noted  . Supervision of high risk pregnancy in second trimester 06/06/2015  . Rubella non-immune status, antepartum 04/23/2015  . Anemia of pregnancy in first trimester 04/23/2015  . Maternal varicella, non-immune 03/26/2015  . Positive urine drug screen 03/25/2015  . Left breast mass 03/11/2015  . Dysmenorrhea 10/08/2014  . Chronic hypertension in pregnancy 10/08/2014  . 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 Microalbuminuria 10/08/2014  . Obesity (BMI 30.0-34.9) 10/08/2014  . Migraine without aura and responsive to treatment 10/08/2014    History reviewed. No pertinent past surgical history.  Family History  Problem Relation Age of Onset  . Hypertension Mother   . Cancer Mother 78    Common Bile Duct  . Hypertension Sister   . Hypertension Brother   . Diabetes Paternal Grandmother   . Cancer Paternal Grandmother 38    brain cancer  . Diabetes Paternal Grandfather   . Hypertension Father   . Cancer Father 62    kidney cancer    Social History   Social History  . Marital Status: Single    Spouse Name: N/A  . Number of Children: N/A  . Years of Education: N/A   Occupational History  . Not on file.    Social History Main Topics  . Smoking status: Never Smoker   . Smokeless tobacco: Never Used  . Alcohol Use: No  . Drug Use: No  . Sexual Activity:    Partners: Male    Birth Control/ Protection: Pill   Other Topics Concern  . Not on file   Social History Narrative     Current outpatient prescriptions:  .  aspirin EC 81 MG tablet, Take 1 tablet (81 mg total) by mouth daily. Take after 12 weeks for prevention of preeclampssia later in pregnancy, Disp: 300 tablet, Rfl: 2 .  ferrous sulfate 325 (65 FE) MG tablet, Take 1 tablet (325 mg total) by mouth daily with breakfast., Disp: 60 tablet, Rfl: 1 .  methyldopa (ALDOMET) 250 MG tablet, Take 1 tablet (250 mg total) by mouth 3 (three) times daily., Disp: 90 tablet, Rfl: 0 .  prenatal vitamin w/FE, FA (NATACHEW) 29-1 MG CHEW chewable tablet, Chew 1 tablet by mouth daily at 12 noon., Disp: 30 tablet, Rfl: 0  No Known Allergies   ROS  Constitutional: Negative for fever or significant weight change.  Respiratory: Negative for cough and shortness of breath ( occasionally only ).   Cardiovascular: Positive for chest pain and  Palpitations - usually at work and occasional.  Gastrointestinal: Positive  For intermittent low  abdominal pain ( sharp lasts seconds - she  is pregnant ), no bowel changes.  Musculoskeletal: Negative for gait problem or joint swelling.  Skin: Negative for rash.  Neurological: Negative for dizziness or headache.  No other specific complaints in a complete review of systems (except as listed in HPI above). Objective  Filed Vitals:   06/14/15 0911  BP: 118/76  Pulse: 103  Temp: 98 F (36.7 C)  TempSrc: Oral  Resp: 18  Height: 5\' 6"  (1.676 m)  Weight: 207 lb 3.2 oz (93.985 kg)  SpO2: 97%    Body mass index is 33.46 kg/(m^2).  Physical Exam  Constitutional: Patient appears well-developed and well-nourished. Obese  No distress.  HEENT: head atraumatic, normocephalic, pupils equal and reactive to light,  neck supple, throat within normal limits Cardiovascular: Normal rate, regular rhythm and normal heart sounds.  No murmur heard. No BLE edema. Pulmonary/Chest: Effort normal and breath sounds normal. No respiratory distress. Abdominal: Soft.  There is no tenderness. Psychiatric: Patient has a normal mood and affect. behavior is normal. Judgment and thought content normal.  Recent Results (from the past 2160 hour(s))  ABO     Status: None   Collection Time: 03/21/15 10:43 AM  Result Value Ref Range   ABO Grouping A   Antibody screen     Status: None   Collection Time: 03/21/15 10:43 AM  Result Value Ref Range   Antibody Screen Negative Negative  CBC with Differential/Platelet     Status: Abnormal   Collection Time: 03/21/15 10:43 AM  Result Value Ref Range   WBC 5.6 3.4 - 10.8 x10E3/uL   RBC 3.68 (L) 3.77 - 5.28 x10E6/uL   Hemoglobin 11.9 11.1 - 15.9 g/dL   Hematocrit 16.1 09.6 - 46.6 %   MCV 95 79 - 97 fL   MCH 32.3 26.6 - 33.0 pg   MCHC 33.9 31.5 - 35.7 g/dL   RDW 04.5 (H) 40.9 - 81.1 %   Platelets 265 150 - 379 x10E3/uL   Neutrophils 66 %   Lymphs 26 %   Monocytes 7 %   Eos 1 %   Basos 0 %   Neutrophils Absolute 3.7 1.4 - 7.0 x10E3/uL   Lymphocytes Absolute 1.4 0.7 - 3.1 x10E3/uL   Monocytes Absolute 0.4 0.1 - 0.9 x10E3/uL   EOS (ABSOLUTE) 0.1 0.0 - 0.4 x10E3/uL   Basophils Absolute 0.0 0.0 - 0.2 x10E3/uL   Immature Granulocytes 0 %   Immature Grans (Abs) 0.0 0.0 - 0.1 x10E3/uL  Hemoglobin A1c     Status: None   Collection Time: 03/21/15 10:43 AM  Result Value Ref Range   Hgb A1c MFr Bld 5.0 4.8 - 5.6 %    Comment:          Pre-diabetes: 5.7 - 6.4          Diabetes: >6.4          Glycemic control for adults with diabetes: <7.0    Est. average glucose Bld gHb Est-mCnc 97 mg/dL  Hepatitis B surface antigen     Status: None   Collection Time: 03/21/15 10:43 AM  Result Value Ref Range   Hepatitis B Surface Ag Negative Negative  Rh Type     Status: None   Collection  Time: 03/21/15 10:43 AM  Result Value Ref Range   Rh Factor Negative     Comment: Please note: Prior records for this patient's ABO / Rh type are not available for additional verification.   RPR     Status: None   Collection Time:  03/21/15 10:43 AM  Result Value Ref Range   RPR Ser Ql Non Reactive Non Reactive  Rubella antibody, IgM     Status: None   Collection Time: 03/21/15 10:43 AM  Result Value Ref Range   Rubella IgM <20.0 0.0 - 19.9 AU/mL    Comment:                              Negative            <20.0                              Equivocal     20.0 - 24.9                              Positive            >24.9   TSH     Status: None   Collection Time: 03/21/15 10:43 AM  Result Value Ref Range   TSH 1.240 0.450 - 4.500 uIU/mL  Varicella zoster antibody, IgM     Status: None   Collection Time: 03/21/15 10:43 AM  Result Value Ref Range   Varicella IgM <0.91 0.00 - 0.90 index    Comment:                                Negative          <0.91                                Borderline  0.91 - 1.09                                Positive          >1.09   Culture, OB Urine     Status: None   Collection Time: 03/21/15 11:02 AM  Result Value Ref Range   Urine Culture, OB Final report   GC/Chlamydia Probe Amp     Status: None   Collection Time: 03/21/15 11:02 AM  Result Value Ref Range   Chlamydia trachomatis, NAA Negative Negative   Neisseria gonorrhoeae by PCR Negative Negative  Pain Mgt Scrn (14 Drugs), Ur     Status: None   Collection Time: 03/21/15 11:02 AM  Result Value Ref Range   Amphetamine Screen, Ur Negative Cutoff=1000 ng/mL   Barbiturate Screen, Ur Negative Cutoff=200 ng/mL   Benzodiazepine Screen, Urine Negative Cutoff=200 ng/mL   Cannabinoids Ur Ql Scn Positive Cutoff=20 ng/mL   Cocaine(Metab.)Screen, Urine Negative Cutoff=300 ng/mL   Opiate Scrn, Ur Negative Cutoff=300 ng/mL    Comment: Opiate test includes Codeine, Morphine, Hydromorphone, Hydrocodone.    Oxycodone+Oxymorphone Ur Ql Scn Negative Cutoff=100 ng/mL    Comment: Test includes Oxycodone and Oxymorphone   PCP Scrn, Ur Negative Cutoff=25 ng/mL   Methadone Scn, Ur Negative Cutoff=300 ng/mL   Propoxyphene, Screen Negative Cutoff=300 ng/mL   Meperidine Screen, Urine Negative Cutoff=200 ng/mL    Comment: This test was developed and its performance characteristics determined by [facility]. It has not been cleared or approved by the Food and Drug Administration.    Fentanyl, Urine Negative Cutoff=2000 pg/mL    Comment:  Test includes Fentanyl and Norfentanyl This test was developed and its performance characteristics determined by [facility]. It has not been cleared or approved by the Food and Drug Administration.    Tramadol Ur Ql Scn Negative Cutoff=200 ng/mL   Buprenorphine, Urine Negative Cutoff=10 ng/mL   Creatinine(Crt), U 211.0 20.0 - 300.0 mg/dL   Ph of Urine 6.3 4.5 - 8.9  Urinalysis, Routine w reflex microscopic (not at [facility])     Status: Abnormal   Collection Time: 03/21/15 11:02 AM  Result Value Ref Range   Specific Gravity, UA 1.024 1.005 - 1.030   pH, UA 6.5 5.0 - 7.5   Color, UA Yellow Yellow   Appearance Ur Cloudy (A) Clear   Leukocytes, UA Negative Negative   Protein, UA Negative Negative/Trace   Glucose, UA Negative Negative   Ketones, UA Negative Negative   RBC, UA Negative Negative   Bilirubin, UA Negative Negative   Urobilinogen, Ur 0.2 0.2 - 1.0 mg/dL   Nitrite, UA Negative Negative   Microscopic Examination Comment     Comment: Microscopic not indicated and not performed.  Result     Status: None   Collection Time: 03/21/15 11:02 AM  Result Value Ref Range   Result 1 Comment     Comment: Mixed urogenital flora 25,000-50,000 colony forming units per mL   POCT urinalysis dipstick     Status: None   Collection Time: 04/23/15  9:38 AM  Result Value Ref Range   Color, UA yellow    Clarity, UA clear    Glucose, UA negative    Bilirubin, UA negative     Ketones, UA negative    Spec Grav, UA 1.015    Blood, UA negative    pH, UA 6.5    Protein, UA negative    Urobilinogen, UA negative    Nitrite, UA negative    Leukocytes, UA Negative Negative  Pain Mgt Scrn (14 Drugs), Ur     Status: None   Collection Time: 04/23/15  3:00 PM  Result Value Ref Range   Amphetamine Screen, Ur Negative Cutoff=1000 ng/mL   Barbiturate Screen, Ur Negative Cutoff=200 ng/mL   Benzodiazepine Screen, Urine Negative Cutoff=200 ng/mL   Cannabinoids Ur Ql Scn Positive Cutoff=20 ng/mL   Cocaine(Metab.)Screen, Urine Negative Cutoff=300 ng/mL   Opiate Scrn, Ur Negative Cutoff=300 ng/mL    Comment: Opiate test includes Codeine, Morphine, Hydromorphone, Hydrocodone.   Oxycodone+Oxymorphone Ur Ql Scn Negative Cutoff=100 ng/mL    Comment: Test includes Oxycodone and Oxymorphone   PCP Scrn, Ur Negative Cutoff=25 ng/mL   Methadone Scn, Ur Negative Cutoff=300 ng/mL   Propoxyphene, Screen Negative Cutoff=300 ng/mL   Meperidine Screen, Urine Negative Cutoff=200 ng/mL    Comment: This test was developed and its performance characteristics determined by [facility]. It has not been cleared or approved by the Food and Drug Administration.    Fentanyl, Urine Negative Cutoff=2000 pg/mL    Comment: Test includes Fentanyl and Norfentanyl This test was developed and its performance characteristics determined by [facility]. It has not been cleared or approved by the Food and Drug Administration.    Tramadol Ur Ql Scn Negative Cutoff=200 ng/mL   Buprenorphine, Urine Negative Cutoff=10 ng/mL   Creatinine(Crt), U 139.6 20.0 - 300.0 mg/dL   Ph of Urine 6.3 4.5 - 8.9   PLEASE NOTE: Comment     Comment: This assay provides a preliminary unconfirmed analytical test result that may be suitable for clinical management of patients in certain situations. Drug-test results should be interpreted in  the context of clinical information. Patient metabolic variables, specific drug chemistry, and  specimen characteristics can affect test outcome. Technical consultation is available if a test result is inconsistent with an expected outcome. ([email or web address] or call toll-free [phone])   Protein / creatinine ratio, urine     Status: None   Collection Time: 05/09/15  9:46 AM  Result Value Ref Range   Creatinine, Urine 79.7 Not Estab. mg/dL   Protein, Ur 09.8 Not Estab. mg/dL   Protein/Creat Ratio 119 0 - 200 mg/g creat  Comprehensive metabolic panel     Status: Abnormal   Collection Time: 05/09/15  9:47 AM  Result Value Ref Range   Glucose 94 65 - 99 mg/dL   BUN 4 (L) 6 - 20 mg/dL   Creatinine, Ser 1.47 0.57 - 1.00 mg/dL   GFR calc non Af Amer 133 >59 mL/min/1.73   GFR calc Af Amer 153 >59 mL/min/1.73   BUN/Creatinine Ratio 7 (L) 8 - 20   Sodium 138 134 - 144 mmol/L   Potassium 4.0 3.5 - 5.2 mmol/L   Chloride 101 96 - 106 mmol/L   CO2 21 18 - 29 mmol/L   Calcium 9.2 8.7 - 10.2 mg/dL   Total Protein 6.7 6.0 - 8.5 g/dL   Albumin 4.3 3.5 - 5.5 g/dL   Globulin, Total 2.4 1.5 - 4.5 g/dL   Albumin/Globulin Ratio 1.8 1.1 - 2.5   Bilirubin Total 0.2 0.0 - 1.2 mg/dL   Alkaline Phosphatase 59 39 - 117 IU/L   AST 14 0 - 40 IU/L   ALT 13 0 - 32 IU/L  First Trimester Screen w/NT     Status: None   Collection Time: 05/16/15  7:00 AM  Result Value Ref Range   Results Report    Test Results: *Screen Negative*    CRL 82.1 mm   CRL Scan Date:     Comment:                               05/16/2015   Sonographer ID# [id number]    Gest Age-Collect 13.4 weeks   Maternal Age At EDD 21.8 years   Race Caucasian    Weight 204 lbs   Number of Fetuses 1    Nuchal Translucency 1.9 mm   Nuchal Translucency MoM 1.44    hCG Value 41.5 IU/mL   hCG MoM 0.64    PAPP-A Value 774.2 ng/mL   PAPP-A MoM 0.80    DIA Value 163.2 pg/mL   DIA MoM 0.94    Down Syndrome Screening Risk:     Comment:                              <1 in 13086   Down Syndrome Age Risk:     Comment:                                1 in 1019   Trisomy 18 Screening Risk:     Comment:                              <1 in 57846   Trisomy 18 Age Risk:     Comment:  1 in 1995   Down Syndrome Interpretation Comment     Comment: Screen Negative for Down syndrome   Trisomy 67 (Edward) Syndrome Interp. Comment     Comment: Screen Negative for Trisomy 18   Comments Comment     Comment: The [employer] recommends that all women be counseled regarding the differences between screening and invasive diagnostic testing.    Note: Comment     Comment: This test does not screen for open neural tube defects (ONTD). Measuring MS-AFP (test number [id number]) in the second trimester can provide ONTD screening.  The optimal gestational age for ONTD screening is 39 - 18 weeks. Milus Mallick, Ph.D., [facility] Principal Genetics Technical Director References: Available upon request Risk Cutoffs  Down Syndrome (DS) cutoff 1:250  Trisomy 18 (T18) cutoff   1:100 For further inquiries contact [facility] at [phone].    PDF .   AFP, Serum, Open Spina Bifida     Status: None   Collection Time: 06/06/15  9:34 AM  Result Value Ref Range   Results Report    Test Results: *Screen Negative*    Gest. Age on Collection Date 16.9 weeks   Gestat. Age Based On As provided     Comment: Recalculations are not recommended when gestational dating by LMP and ultrasound are within 10 days.    Maternal Age [age] EDD 21.8 years   Race Caucasian    Weight 206 lbs   Insulin Dep Diabetes No    Multiple Gestation No    AFP Value 45.2 ng/mL   AFP MoM 1.52    OSBR Risk       1 IN 2591    Interpretation Comment     Comment: Interpretation: Screen Negative This result is screen negative for fetal OSB. The AFP MoM calculated is based on the gestational age provided. MS-AFP can identify up to 80% of open fetal neural tube defects. Closed neural  tube defects and some open defects may not be detected by this test. This test does not screen for fetal Down Syndrome or Trisomy 18. If screening for Down Syndrome or Trisomy 18 is desired, Pharmacist, hospital to discuss available options.  The [employer] recommends amniocentesis be offered to women age 38 and older.    Comment: Comment     Comment: Milus Mallick, Ph.D., [facility] Principal Genetics Technical Director References: Available Upon Request. Multiples Of Median Cutoffs         For AFP Elevations Singleton   2.5           Black    2.8 IDD         2.0           Twins    4.5         Abbreviation Definitions IDD - Insulin Dep Diabetes OSBR - Open Spina Bifida Risk For further inquiries contact [facility] at [phone].    PDF .   POCT urinalysis dipstick     Status: Normal   Collection Time: 06/06/15 10:29 AM  Result Value Ref Range   Color, UA pale yellow    Clarity, UA clear    Glucose, UA neg    Bilirubin, UA neg    Ketones, UA neg    Spec Grav, UA 1.010    Blood, UA neg    pH, UA 8.0    Protein, UA neg    Urobilinogen, UA negative  Nitrite, UA neg    Leukocytes, UA Negative Negative     PHQ2/9: Depression screen [facility] 2/9 06/14/2015 02/21/2015 12/11/2014 10/11/2014  Decreased Interest 0 0 0 0  Down, Depressed, Hopeless 0 0 0 0  PHQ - 2 Score 0 0 0 0     Fall Risk: Fall Risk  06/14/2015 02/21/2015 12/11/2014 10/11/2014  Falls in the past year? No No No No     Functional Status Survey: Is the patient deaf or have difficulty hearing?: No Does the patient have difficulty seeing, even when wearing glasses/contacts?: No Does the patient have difficulty concentrating, remembering, or making decisions?: No Does the patient have difficulty walking or climbing stairs?: No Does the patient have difficulty dressing or bathing?: No Does the patient have difficulty doing errands alone  such as visiting a doctor's office or shopping?: No    Assessment & Plan  1. Essential (primary) hypertension  Occasional chest pain, but she is pregnant and explained there are many reasons for intermittent chest pain - explained that usually is secondary to GERD - avoid caffeine, spicy food and sodas and monitor - methyldopa (ALDOMET) 250 MG tablet; Take 1 tablet (250 mg total) by mouth 3 (three) times daily.  Dispense: 90 tablet; Refill: 0  2. Tachycardia  HR max of 114 - she is pregnant, discussed switching to labetolol, but we decided to wait for now

## 2015-07-03 ENCOUNTER — Other Ambulatory Visit: Payer: Self-pay | Admitting: Family Medicine

## 2015-07-04 ENCOUNTER — Ambulatory Visit (INDEPENDENT_AMBULATORY_CARE_PROVIDER_SITE_OTHER): Payer: BLUE CROSS/BLUE SHIELD | Admitting: Obstetrics and Gynecology

## 2015-07-04 ENCOUNTER — Ambulatory Visit (INDEPENDENT_AMBULATORY_CARE_PROVIDER_SITE_OTHER): Payer: BLUE CROSS/BLUE SHIELD

## 2015-07-04 VITALS — BP 109/63 | HR 84 | Wt 210.8 lb

## 2015-07-04 DIAGNOSIS — O10912 Unspecified pre-existing hypertension complicating pregnancy, second trimester: Secondary | ICD-10-CM

## 2015-07-04 DIAGNOSIS — E669 Obesity, unspecified: Secondary | ICD-10-CM

## 2015-07-04 DIAGNOSIS — Z3492 Encounter for supervision of normal pregnancy, unspecified, second trimester: Secondary | ICD-10-CM

## 2015-07-04 DIAGNOSIS — O0992 Supervision of high risk pregnancy, unspecified, second trimester: Secondary | ICD-10-CM

## 2015-07-04 LAB — POCT URINALYSIS DIPSTICK
Bilirubin, UA: NEGATIVE
Blood, UA: NEGATIVE
Glucose, UA: NEGATIVE
Ketones, UA: NEGATIVE
Leukocytes, UA: NEGATIVE
Nitrite, UA: NEGATIVE
Protein, UA: NEGATIVE
Spec Grav, UA: 1.015
Urobilinogen, UA: NEGATIVE
pH, UA: 7

## 2015-07-04 NOTE — Patient Instructions (Signed)

## 2015-07-04 NOTE — Progress Notes (Signed)
ROB: Patient doing well, denies complaints.  S/p anatomy scan with incomplete anatomy.   BPs low normal today, discussed hypotension.  Can continue Aldomet.  To return in 3-4 weeks.

## 2015-07-12 ENCOUNTER — Ambulatory Visit (INDEPENDENT_AMBULATORY_CARE_PROVIDER_SITE_OTHER): Payer: BLUE CROSS/BLUE SHIELD

## 2015-07-12 DIAGNOSIS — O0992 Supervision of high risk pregnancy, unspecified, second trimester: Secondary | ICD-10-CM | POA: Diagnosis not present

## 2015-07-30 ENCOUNTER — Ambulatory Visit (INDEPENDENT_AMBULATORY_CARE_PROVIDER_SITE_OTHER): Payer: BLUE CROSS/BLUE SHIELD | Admitting: Obstetrics and Gynecology

## 2015-07-30 VITALS — BP 114/74 | HR 102 | Wt 216.9 lb

## 2015-07-30 DIAGNOSIS — E669 Obesity, unspecified: Secondary | ICD-10-CM

## 2015-07-30 DIAGNOSIS — O0992 Supervision of high risk pregnancy, unspecified, second trimester: Secondary | ICD-10-CM

## 2015-07-30 DIAGNOSIS — Z131 Encounter for screening for diabetes mellitus: Secondary | ICD-10-CM

## 2015-07-30 DIAGNOSIS — O10912 Unspecified pre-existing hypertension complicating pregnancy, second trimester: Secondary | ICD-10-CM

## 2015-07-30 LAB — POCT URINALYSIS DIPSTICK
Bilirubin, UA: NEGATIVE
Blood, UA: NEGATIVE
Glucose, UA: NEGATIVE
Ketones, UA: NEGATIVE
LEUKOCYTES UA: NEGATIVE
Nitrite, UA: NEGATIVE
PH UA: 7.5
PROTEIN UA: NEGATIVE
UROBILINOGEN UA: NEGATIVE

## 2015-07-30 NOTE — Progress Notes (Signed)
ROB: Denies complaints.  Doing well.  BPs wnl.  Continue Aldomet.  RTC in 4 weeks. For 28 week labs at that time.

## 2015-08-08 ENCOUNTER — Encounter: Payer: Self-pay | Admitting: *Deleted

## 2015-08-08 ENCOUNTER — Observation Stay
Admission: EM | Admit: 2015-08-08 | Discharge: 2015-08-08 | Disposition: A | Discharge status: Home / Self Care | Payer: BLUE CROSS/BLUE SHIELD | Attending: Obstetrics and Gynecology | Admitting: Obstetrics and Gynecology

## 2015-08-08 DIAGNOSIS — O4702 False labor before 37 completed weeks of gestation, second trimester: Principal | ICD-10-CM | POA: Insufficient documentation

## 2015-08-08 DIAGNOSIS — Z3A26 26 weeks gestation of pregnancy: Secondary | ICD-10-CM | POA: Insufficient documentation

## 2015-08-10 ENCOUNTER — Other Ambulatory Visit: Payer: Self-pay | Admitting: Family Medicine

## 2015-08-12 NOTE — Final Progress Note (Signed)
L&D OB Triage Note  Lori Gross is a 20 y.o. G1P0 female at 2564w3d, EDD Estimated Date of Delivery: 11/15/15 who presented to triage for complaints of contractions x 1 day.  Denied vaginal bleeding, LOF, and notes good fetal movement.  Has not had recent intercourse and she reports maintaining adequate hydration.  She was evaluated by the nurses with findings significant for contractions q 3-7 min. Vital signs stable. An NST was performed and has been reviewed by MD. She was treated with IV fluids.    Physical Exam:  Blood pressure 140/81, pulse 94, temperature 98.4 F (36.9 C), temperature source Oral, resp. rate 18, last menstrual period 02/01/2015. Gen App: NAD Cervix: deferred.  NST INTERPRETATION: Indications: rule out uterine contractions  Mode: External Baseline Rate (A): 155 bpm Variability: Moderate Accelerations: 15 x 15 Decelerations: None     Contraction Frequency (min): 3-7  Impression: reactive   Labs:  Results for orders placed or performed in visit on 07/30/15  POCT urinalysis dipstick  Result Value Ref Range   Color, UA yellow    Clarity, UA clear    Glucose, UA neg    Bilirubin, UA neg    Ketones, UA neg    Spec Grav, UA <=1.005    Blood, UA neg    pH, UA 7.5    Protein, UA neg    Urobilinogen, UA negative    Nitrite, UA neg    Leukocytes, UA Negative Negative    Plan:  NST performed was reviewed and was found to be reactive. Patient administered IVF 1L bolus with spacing of contractions. Contractions were undetectable to patient after IV boudl. . She was discharged home with bleeding/labor precautions.  Continue routine prenatal care. Follow up with OB/GYN as previously scheduled.     Hildred LaserAnika Ambri Miltner, MD

## 2015-08-27 ENCOUNTER — Other Ambulatory Visit: Payer: BLUE CROSS/BLUE SHIELD

## 2015-08-27 ENCOUNTER — Ambulatory Visit (INDEPENDENT_AMBULATORY_CARE_PROVIDER_SITE_OTHER): Payer: BLUE CROSS/BLUE SHIELD | Admitting: Obstetrics and Gynecology

## 2015-08-27 ENCOUNTER — Other Ambulatory Visit: Payer: Self-pay

## 2015-08-27 VITALS — BP 117/77 | HR 94 | Wt 219.8 lb

## 2015-08-27 DIAGNOSIS — Z23 Encounter for immunization: Secondary | ICD-10-CM | POA: Diagnosis not present

## 2015-08-27 DIAGNOSIS — O0993 Supervision of high risk pregnancy, unspecified, third trimester: Secondary | ICD-10-CM | POA: Diagnosis not present

## 2015-08-27 DIAGNOSIS — O26899 Other specified pregnancy related conditions, unspecified trimester: Secondary | ICD-10-CM | POA: Insufficient documentation

## 2015-08-27 DIAGNOSIS — Z6791 Unspecified blood type, Rh negative: Secondary | ICD-10-CM | POA: Insufficient documentation

## 2015-08-27 DIAGNOSIS — O99011 Anemia complicating pregnancy, first trimester: Secondary | ICD-10-CM

## 2015-08-27 DIAGNOSIS — O360130 Maternal care for anti-D [Rh] antibodies, third trimester, not applicable or unspecified: Secondary | ICD-10-CM

## 2015-08-27 DIAGNOSIS — Z131 Encounter for screening for diabetes mellitus: Secondary | ICD-10-CM

## 2015-08-27 DIAGNOSIS — O10913 Unspecified pre-existing hypertension complicating pregnancy, third trimester: Secondary | ICD-10-CM

## 2015-08-27 DIAGNOSIS — O0992 Supervision of high risk pregnancy, unspecified, second trimester: Secondary | ICD-10-CM

## 2015-08-27 DIAGNOSIS — E669 Obesity, unspecified: Secondary | ICD-10-CM

## 2015-08-27 HISTORY — DX: Other specified pregnancy related conditions, unspecified trimester: O26.899

## 2015-08-27 LAB — POCT URINALYSIS DIPSTICK
BILIRUBIN UA: NEGATIVE
Blood, UA: NEGATIVE
GLUCOSE UA: NEGATIVE
KETONES UA: NEGATIVE
LEUKOCYTES UA: NEGATIVE
Nitrite, UA: NEGATIVE
Protein, UA: NEGATIVE
Spec Grav, UA: 1.02
Urobilinogen, UA: NEGATIVE
pH, UA: 6.5

## 2015-08-27 MED ORDER — RHO D IMMUNE GLOBULIN 1500 UNITS IM SOSY
1500.0000 [IU] | PREFILLED_SYRINGE | Freq: Once | INTRAMUSCULAR | Status: AC
  Administered 2015-08-27: 1500 [IU] via INTRAMUSCULAR

## 2015-08-27 MED ORDER — TETANUS-DIPHTH-ACELL PERTUSSIS 5-2.5-18.5 LF-MCG/0.5 IM SUSP
0.5000 mL | Freq: Once | INTRAMUSCULAR | Status: AC
  Administered 2015-08-27: 0.5 mL via INTRAMUSCULAR

## 2015-08-27 NOTE — Progress Notes (Signed)
ROB: Doing well, no complaints. For 28 week labs today.  Discussed cord blood banking.  For Tdap and Rhogam today.  Signed blood consent. RTC in 2-3 weeks.

## 2015-08-27 NOTE — Patient Instructions (Signed)
Rh0 [D] Immune Globulin injection What is this medicine? RhO [D] IMMUNE GLOBULIN (i MYOON GLOB yoo lin) is used to treat idiopathic thrombocytopenic purpura (ITP). This medicine is used in RhO negative mothers who are pregnant with a RhO positive child. It is also used after a transfusion of RhO positive blood into a RhO negative person. This medicine may be used for other purposes; ask your health care provider or pharmacist if you have questions. What should I tell my health care provider before I take this medicine? They need to know if you have any of these conditions: -bleeding disorders -low levels of immunoglobulin A in the body -no spleen -an unusual or allergic reaction to human immune globulin, other medicines, foods, dyes, or preservatives -pregnant or trying to get pregnant -breast-feeding How should I use this medicine? This medicine is for injection into a muscle or into a vein. It is given by a health care professional in a hospital or clinic setting. Talk to your pediatrician regarding the use of this medicine in children. This medicine is not approved for use in children. Overdosage: If you think you have taken too much of this medicine contact a poison control center or emergency room at once. NOTE: This medicine is only for you. Do not share this medicine with others. What if I miss a dose? It is important not to miss your dose. Call your doctor or health care professional if you are unable to keep an appointment. What may interact with this medicine? -live virus vaccines, like measles, mumps, or rubella This list may not describe all possible interactions. Give your health care provider a list of all the medicines, herbs, non-prescription drugs, or dietary supplements you use. Also tell them if you smoke, drink alcohol, or use illegal drugs. Some items may interact with your medicine. What should I watch for while using this medicine? This medicine is made from human blood.  It may be possible to pass an infection in this medicine. Talk to your doctor about the risks and benefits of this medicine. This medicine may interfere with live virus vaccines. Before you get live virus vaccines tell your health care professional if you have received this medicine within the past 3 months. What side effects may I notice from receiving this medicine? Side effects that you should report to your doctor or health care professional as soon as possible: -allergic reactions like skin rash, itching or hives, swelling of the face, lips, or tongue -breathing problems -chest pain or tightness -yellowing of the eyes or skin Side effects that usually do not require medical attention (report to your doctor or health care professional if they continue or are bothersome): -fever -pain and tenderness at site where injected This list may not describe all possible side effects. Call your doctor for medical advice about side effects. You may report side effects to FDA at 1-800-FDA-1088. Where should I keep my medicine? This drug is given in a hospital or clinic and will not be stored at home. NOTE: This sheet is a summary. It may not cover all possible information. If you have questions about this medicine, talk to your doctor, pharmacist, or health care provider.    2016, Elsevier/Gold Standard. (2007-12-05 14:06:10) Tdap Vaccine (Tetanus, Diphtheria and Pertussis): What You Need to Know 1. Why get vaccinated? Tetanus, diphtheria and pertussis are very serious diseases. Tdap vaccine can protect [place] from these diseases. And, Tdap vaccine given to pregnant women can protect newborn babies against pertussis. TETANUS (Lockjaw)  is rare in the [place] today. It causes painful muscle tightening and stiffness, usually all over the body.  It can lead to tightening of muscles in the head and neck so you can't open your mouth, swallow, or sometimes even breathe. Tetanus kills about 1 out of 10  people who are infected even after receiving the best medical care. DIPHTHERIA is also rare in the [place] today. It can cause a thick coating to form in the back of the throat.  It can lead to breathing problems, heart failure, paralysis, and death. PERTUSSIS (Whooping Cough) causes severe coughing spells, which can cause difficulty breathing, vomiting and disturbed sleep.  It can also lead to weight loss, incontinence, and rib fractures. Up to 2 in 100 adolescents and 5 in 100 adults with pertussis are hospitalized or have complications, which could include pneumonia or death. These diseases are caused by bacteria. Diphtheria and pertussis are spread from person to person through secretions from coughing or sneezing. Tetanus enters the body through cuts, scratches, or wounds. Before vaccines, as many as 200,000 cases of diphtheria, 200,000 cases of pertussis, and hundreds of cases of tetanus, were reported in the [place] each year. Since vaccination began, reports of cases for tetanus and diphtheria have dropped by about 99% and for pertussis by about 80%. 2. Tdap vaccine Tdap vaccine can protect adolescents and adults from tetanus, diphtheria, and pertussis. One dose of Tdap is routinely given at age 22 or 6012. People who did not get Tdap at that age should get it as soon as possible. Tdap is especially important for healthcare professionals and anyone having close contact with a baby younger than 12 months. Pregnant women should get a dose of Tdap during every pregnancy, to protect the newborn from pertussis. Infants are most at risk for severe, life-threatening complications from pertussis. Another vaccine, called Td, protects against tetanus and diphtheria, but not pertussis. A Td booster should be given every 10 years. Tdap may be given as one of these boosters if you have never gotten Tdap before. Tdap may also be given after a severe cut or burn to prevent tetanus infection. Your  doctor or the person giving you the vaccine can give you more information. Tdap may safely be given at the same time as other vaccines. 3. Some people should not get this vaccine  A person who has ever had a life-threatening allergic reaction after a previous dose of any diphtheria, tetanus or pertussis containing vaccine, OR has a severe allergy to any part of this vaccine, should not get Tdap vaccine. Tell the person giving the vaccine about any severe allergies.  Anyone who had coma or long repeated seizures within 7 days after a childhood dose of DTP or DTaP, or a previous dose of Tdap, should not get Tdap, unless a cause other than the vaccine was found. They can still get Td.  Talk to your doctor if you:  have seizures or another nervous system problem,  had severe pain or swelling after any vaccine containing diphtheria, tetanus or pertussis,  ever had a condition called Guillain-Barr Syndrome (GBS),  aren't feeling well on the day the shot is scheduled. 4. Risks With any medicine, including vaccines, there is a chance of side effects. These are usually mild and go away on their own. Serious reactions are also possible but are rare. Most people who get Tdap vaccine do not have any problems with it. Mild problems following Tdap (Did not interfere with activities)  Pain where the shot was given (about 3 in 4 adolescents or 2 in 3 adults)  Redness or swelling where the shot was given (about 1 person in 5)  Mild fever of at least 100.88F (up to about 1 in 25 adolescents or 1 in 100 adults)  Headache (about 3 or 4 people in 10)  Tiredness (about 1 person in 3 or 4)  Nausea, vomiting, diarrhea, stomach ache (up to 1 in 4 adolescents or 1 in 10 adults)  Chills, sore joints (about 1 person in 10)  Body aches (about 1 person in 3 or 4)  Rash, swollen glands (uncommon) Moderate problems following Tdap (Interfered with activities, but did not require medical attention)  Pain  where the shot was given (up to 1 in 5 or 6)  Redness or swelling where the shot was given (up to about 1 in 16 adolescents or 1 in 12 adults)  Fever over 102F (about 1 in 100 adolescents or 1 in 250 adults)  Headache (about 1 in 7 adolescents or 1 in 10 adults)  Nausea, vomiting, diarrhea, stomach ache (up to 1 or 3 people in 100)  Swelling of the entire arm where the shot was given (up to about 1 in 500). Severe problems following Tdap (Unable to perform usual activities; required medical attention)  Swelling, severe pain, bleeding and redness in the arm where the shot was given (rare). Problems that could happen after any vaccine:  People sometimes faint after a medical procedure, including vaccination. Sitting or lying down for about 15 minutes can help prevent fainting, and injuries caused by a fall. Tell your doctor if you feel dizzy, or have vision changes or ringing in the ears.  Some people get severe pain in the shoulder and have difficulty moving the arm where a shot was given. This happens very rarely.  Any medication can cause a severe allergic reaction. Such reactions from a vaccine are very rare, estimated at fewer than 1 in a million doses, and would happen within a few minutes to a few hours after the vaccination. As with any medicine, there is a very remote chance of a vaccine causing a serious injury or death. The safety of vaccines is always being monitored. For more information, visit: http://floyd.org/ 5. What if there is a serious problem? What should I look for?  Look for anything that concerns you, such as signs of a severe allergic reaction, very high fever, or unusual behavior.  Signs of a severe allergic reaction can include hives, swelling of the face and throat, difficulty breathing, a fast heartbeat, dizziness, and weakness. These would usually start a few minutes to a few hours after the vaccination. What should I do?  If you think it is a  severe allergic reaction or other emergency that can't wait, call 9-1-1 or get the person to the nearest hospital. Otherwise, call your doctor.  Afterward, the reaction should be reported to the Vaccine Adverse Event Reporting System (VAERS). Your doctor might file this report, or you can do it yourself through the VAERS web site at www.vaers.LAgents.no, or by calling 1-218-197-4734. VAERS does not give medical advice.  6. The National Vaccine Injury Compensation Program The [employer] Vaccine Injury Compensation Program (VICP) is a federal program that was created to compensate people who may have been injured by certain vaccines. Persons who believe they may have been injured by a vaccine can learn about the program and about filing a claim by calling 1-760-593-4721 or visiting the  VICP website at SpiritualWord.at. There is a time limit to file a claim for compensation. 7. How can I learn more?  Ask your doctor. He or she can give you the vaccine package insert or suggest other sources of information.  Call your local or state health department.  Contact the Centers for Disease Control and Prevention (CDC):  Call 620 076 1726 (1-800-CDC-INFO) or  Visit CDC's website at PicCapture.uy CDC Tdap Vaccine VIS (06/13/13)   This information is not intended to replace advice given to you by your health care provider. Make sure you discuss any questions you have with your health care provider.   Document Released: 10/06/2011 Document Revised: 04/27/2014 Document Reviewed: 07/19/2013 Elsevier Interactive Patient Education [employer]

## 2015-08-28 LAB — HEMOGLOBIN AND HEMATOCRIT, BLOOD
HEMOGLOBIN: 10.6 g/dL — AB (ref 11.1–15.9)
Hematocrit: 30.5 % — ABNORMAL LOW (ref 34.0–46.6)

## 2015-08-28 LAB — GLUCOSE, 1 HOUR GESTATIONAL: GESTATIONAL DIABETES SCREEN: 113 mg/dL (ref 65–139)

## 2015-08-29 ENCOUNTER — Telehealth: Payer: Self-pay

## 2015-08-29 DIAGNOSIS — O99013 Anemia complicating pregnancy, third trimester: Principal | ICD-10-CM

## 2015-08-29 DIAGNOSIS — D509 Iron deficiency anemia, unspecified: Secondary | ICD-10-CM

## 2015-08-29 MED ORDER — FERROUS SULFATE 325 (65 FE) MG PO TABS: 325.0000 mg | ORAL_TABLET | Freq: Two times a day (BID) | ORAL | Status: DC

## 2015-08-29 NOTE — Telephone Encounter (Signed)
-----   Message from Hildred LaserAnika Cherry, MD sent at 08/29/2015 11:54 AM EDT ----- Please inform patient of normal glucola, mild anemia noted, should take daily iron supplement.  Encourage patient to sign up for Mychart.

## 2015-08-29 NOTE — Telephone Encounter (Signed)
Called pt no answer, LM for pt informing her of information below. RX sent in for iron supplement  

## 2015-09-11 ENCOUNTER — Ambulatory Visit: Payer: BLUE CROSS/BLUE SHIELD | Admitting: Family Medicine

## 2015-09-18 ENCOUNTER — Ambulatory Visit (INDEPENDENT_AMBULATORY_CARE_PROVIDER_SITE_OTHER): Payer: BLUE CROSS/BLUE SHIELD | Admitting: Obstetrics and Gynecology

## 2015-09-18 ENCOUNTER — Encounter: Payer: Self-pay | Admitting: Obstetrics and Gynecology

## 2015-09-18 VITALS — BP 117/79 | HR 88 | Wt 226.7 lb

## 2015-09-18 DIAGNOSIS — O10913 Unspecified pre-existing hypertension complicating pregnancy, third trimester: Secondary | ICD-10-CM

## 2015-09-18 DIAGNOSIS — O26813 Pregnancy related exhaustion and fatigue, third trimester: Secondary | ICD-10-CM

## 2015-09-18 DIAGNOSIS — E669 Obesity, unspecified: Secondary | ICD-10-CM

## 2015-09-18 DIAGNOSIS — O0993 Supervision of high risk pregnancy, unspecified, third trimester: Secondary | ICD-10-CM

## 2015-09-18 LAB — POCT URINALYSIS DIPSTICK
Bilirubin, UA: NEGATIVE
GLUCOSE UA: NEGATIVE
KETONES UA: NEGATIVE
Leukocytes, UA: NEGATIVE
Nitrite, UA: NEGATIVE
Protein, UA: NEGATIVE
RBC UA: NEGATIVE
Urobilinogen, UA: NEGATIVE
pH, UA: 8

## 2015-09-18 NOTE — Progress Notes (Signed)
ROB: Patient noting fatigue. Discussed methods to combat symptoms.  To begin NSTs twice weekly at 32 weeks.  Continue Aldomet twice daily for cHTN.  For growth scan at 32 weeks and 36 weeks. RTC in 2 weeks.

## 2015-09-20 ENCOUNTER — Ambulatory Visit (INDEPENDENT_AMBULATORY_CARE_PROVIDER_SITE_OTHER): Payer: BLUE CROSS/BLUE SHIELD | Admitting: Obstetrics and Gynecology

## 2015-09-20 VITALS — BP 130/76 | HR 74 | Wt 227.6 lb

## 2015-09-20 DIAGNOSIS — Z283 Underimmunization status: Secondary | ICD-10-CM

## 2015-09-20 DIAGNOSIS — R825 Elevated urine levels of drugs, medicaments and biological substances: Secondary | ICD-10-CM | POA: Diagnosis not present

## 2015-09-20 DIAGNOSIS — O9989 Other specified diseases and conditions complicating pregnancy, childbirth and the puerperium: Secondary | ICD-10-CM | POA: Diagnosis not present

## 2015-09-20 DIAGNOSIS — O09899 Supervision of other high risk pregnancies, unspecified trimester: Secondary | ICD-10-CM

## 2015-09-20 DIAGNOSIS — O10913 Unspecified pre-existing hypertension complicating pregnancy, third trimester: Secondary | ICD-10-CM

## 2015-09-20 DIAGNOSIS — Z3493 Encounter for supervision of normal pregnancy, unspecified, third trimester: Secondary | ICD-10-CM | POA: Diagnosis not present

## 2015-09-20 DIAGNOSIS — E669 Obesity, unspecified: Secondary | ICD-10-CM

## 2015-09-20 LAB — POCT URINALYSIS DIPSTICK
Bilirubin, UA: NEGATIVE
Blood, UA: NEGATIVE
Glucose, UA: NEGATIVE
Ketones, UA: NEGATIVE
NITRITE UA: NEGATIVE
PH UA: 6
PROTEIN UA: NEGATIVE
Spec Grav, UA: >=1.03
UROBILINOGEN UA: 0.2

## 2015-09-20 NOTE — Progress Notes (Signed)
NONSTRESS TEST INTERPRETATION  INDICATIONS: Chronic hypertension and Obesity  FHR baseline: 130-140 RESULTS:Reactive COMMENTS:    PLAN: 1. Continue fetal kick counts twice a day. 2. Continue antepartum testing as scheduled-Biweekly  Darol Destinerystal Jerico Grisso, CMA  Herold HarmsMartin A Defrancesco, MD

## 2015-09-23 ENCOUNTER — Ambulatory Visit (INDEPENDENT_AMBULATORY_CARE_PROVIDER_SITE_OTHER): Payer: BLUE CROSS/BLUE SHIELD | Admitting: Obstetrics and Gynecology

## 2015-09-23 VITALS — BP 138/78 | HR 90 | Wt 228.6 lb

## 2015-09-23 DIAGNOSIS — R825 Elevated urine levels of drugs, medicaments and biological substances: Secondary | ICD-10-CM

## 2015-09-23 DIAGNOSIS — O9989 Other specified diseases and conditions complicating pregnancy, childbirth and the puerperium: Secondary | ICD-10-CM | POA: Diagnosis not present

## 2015-09-23 DIAGNOSIS — Z369 Encounter for antenatal screening, unspecified: Secondary | ICD-10-CM

## 2015-09-23 DIAGNOSIS — Z283 Underimmunization status: Secondary | ICD-10-CM

## 2015-09-23 DIAGNOSIS — O09899 Supervision of other high risk pregnancies, unspecified trimester: Secondary | ICD-10-CM

## 2015-09-23 DIAGNOSIS — Z36 Encounter for antenatal screening of mother: Secondary | ICD-10-CM

## 2015-09-23 DIAGNOSIS — O99011 Anemia complicating pregnancy, first trimester: Secondary | ICD-10-CM

## 2015-09-23 DIAGNOSIS — Z1389 Encounter for screening for other disorder: Secondary | ICD-10-CM

## 2015-09-23 LAB — POCT URINALYSIS DIPSTICK
BILIRUBIN UA: NEGATIVE
GLUCOSE UA: NEGATIVE
KETONES UA: NEGATIVE
Leukocytes, UA: NEGATIVE
Nitrite, UA: NEGATIVE
Protein, UA: NEGATIVE
RBC UA: NEGATIVE
SPEC GRAV UA: 1.02
Urobilinogen, UA: NEGATIVE
pH, UA: 6

## 2015-09-23 NOTE — Progress Notes (Signed)
NONSTRESS TEST INTERPRETATION  INDICATIONS: Chronic Hypertension and Obesity  FHR baseline: 130-140 RESULTS: reactive COMMENTS:    PLAN: 1. Continue fetal kick counts twice a day. 2. Continue antepartum testing as scheduled-Biweekly   Fenton Mallingebbie Taelar Gronewold, LPN

## 2015-09-27 ENCOUNTER — Ambulatory Visit (INDEPENDENT_AMBULATORY_CARE_PROVIDER_SITE_OTHER): Payer: BLUE CROSS/BLUE SHIELD

## 2015-09-27 ENCOUNTER — Ambulatory Visit (INDEPENDENT_AMBULATORY_CARE_PROVIDER_SITE_OTHER): Payer: BLUE CROSS/BLUE SHIELD | Admitting: Obstetrics and Gynecology

## 2015-09-27 VITALS — BP 147/87 | HR 111 | Wt 220.0 lb

## 2015-09-27 DIAGNOSIS — O09899 Supervision of other high risk pregnancies, unspecified trimester: Secondary | ICD-10-CM

## 2015-09-27 DIAGNOSIS — E669 Obesity, unspecified: Secondary | ICD-10-CM

## 2015-09-27 DIAGNOSIS — O9989 Other specified diseases and conditions complicating pregnancy, childbirth and the puerperium: Secondary | ICD-10-CM

## 2015-09-27 DIAGNOSIS — O10913 Unspecified pre-existing hypertension complicating pregnancy, third trimester: Secondary | ICD-10-CM

## 2015-09-27 DIAGNOSIS — O0993 Supervision of high risk pregnancy, unspecified, third trimester: Secondary | ICD-10-CM | POA: Diagnosis not present

## 2015-09-27 DIAGNOSIS — R825 Elevated urine levels of drugs, medicaments and biological substances: Secondary | ICD-10-CM

## 2015-09-27 DIAGNOSIS — O99011 Anemia complicating pregnancy, first trimester: Secondary | ICD-10-CM

## 2015-09-27 DIAGNOSIS — Z283 Underimmunization status: Secondary | ICD-10-CM

## 2015-09-27 NOTE — Progress Notes (Signed)
  NONSTRESS TEST INTERPRETATION  INDICATIONS: Chronic Hypertension, Obesity  FHR baseline: 140-150 RESULTS: reactive COMMENTS: pt pulse rate 111-141, decreasing as NST was running. No c/o chest pain, sob, heart racing.   PLAN: 1. Continue fetal kick counts twice a day. 2. Continue antepartum testing as scheduled-Biweekly   Fenton Mallingebbie Texie Tupou, LPN

## 2015-09-30 ENCOUNTER — Ambulatory Visit (INDEPENDENT_AMBULATORY_CARE_PROVIDER_SITE_OTHER): Payer: BLUE CROSS/BLUE SHIELD | Admitting: Obstetrics and Gynecology

## 2015-09-30 VITALS — BP 142/83 | HR 96 | Wt 229.1 lb

## 2015-09-30 DIAGNOSIS — O0993 Supervision of high risk pregnancy, unspecified, third trimester: Secondary | ICD-10-CM | POA: Diagnosis not present

## 2015-09-30 DIAGNOSIS — E669 Obesity, unspecified: Secondary | ICD-10-CM

## 2015-09-30 DIAGNOSIS — O10913 Unspecified pre-existing hypertension complicating pregnancy, third trimester: Secondary | ICD-10-CM

## 2015-09-30 NOTE — Progress Notes (Signed)
NST performed today was reviewed and was found to be reactive.  Continue recommended antenatal testing and prenatal care.  Lori Lundstrom, MD [facility]  

## 2015-09-30 NOTE — Progress Notes (Signed)
NST performed today was reviewed and was found to be reactive.  Continue recommended antenatal testing and prenatal care.  

## 2015-09-30 NOTE — Progress Notes (Signed)
NONSTRESS TEST INTERPRETATION  INDICATIONS:  Chronic Hypertension, Obesity  FHR baseline: 130-140 RESULTS: reactive COMMENTS: pt unable to give urine sample, unable to void.   PLAN: 1. Continue fetal kick counts twice a day. 2. Continue antepartum testing as scheduled-Biweekly  Fenton Mallingebbie Zahlia Deshazer, LPN

## 2015-09-30 NOTE — Progress Notes (Signed)
NST performed today was reviewed and was found to be reactive.  Continue recommended antenatal testing and prenatal care.  Lev Cervone, MD [facility]  

## 2015-10-03 ENCOUNTER — Ambulatory Visit (INDEPENDENT_AMBULATORY_CARE_PROVIDER_SITE_OTHER): Payer: BLUE CROSS/BLUE SHIELD | Admitting: Obstetrics and Gynecology

## 2015-10-03 ENCOUNTER — Other Ambulatory Visit: Payer: BLUE CROSS/BLUE SHIELD

## 2015-10-03 VITALS — BP 129/74 | HR 109 | Wt 230.6 lb

## 2015-10-03 DIAGNOSIS — O360130 Maternal care for anti-D [Rh] antibodies, third trimester, not applicable or unspecified: Secondary | ICD-10-CM

## 2015-10-03 DIAGNOSIS — O10913 Unspecified pre-existing hypertension complicating pregnancy, third trimester: Secondary | ICD-10-CM

## 2015-10-03 DIAGNOSIS — O0993 Supervision of high risk pregnancy, unspecified, third trimester: Secondary | ICD-10-CM

## 2015-10-03 LAB — POCT URINALYSIS DIPSTICK
Bilirubin, UA: NEGATIVE
Blood, UA: NEGATIVE
Glucose, UA: NEGATIVE
Ketones, UA: NEGATIVE
NITRITE UA: NEGATIVE
PROTEIN UA: NEGATIVE
Spec Grav, UA: >=1.03
UROBILINOGEN UA: NEGATIVE
pH, UA: 6

## 2015-10-03 NOTE — Progress Notes (Signed)
NONSTRESS TEST INTERPRETATION  INDICATIONS: Chronic Hypertension; Obesity  FHR baseline: 150 RESULTS: reactive COMMENTS: small contraction x 1   PLAN: 1. Continue fetal kick counts twice a day. 2. Continue antepartum testing as scheduled-Biweekly   Fenton Mallingebbie Clova Morlock, LPN

## 2015-10-03 NOTE — Progress Notes (Signed)
ROB: Patient doing well, no complaints. Notes compliance with meds. Last growth scan 09/27/15 wnl, EFW 51%ile. Continue twice weekly NSTs.  NST performed today was reviewed and was found to be reactive.  Continue recommended antenatal testing and prenatal care. RTC in 2 weeks

## 2015-10-04 ENCOUNTER — Other Ambulatory Visit: Payer: Self-pay | Admitting: Family Medicine

## 2015-10-07 ENCOUNTER — Ambulatory Visit (INDEPENDENT_AMBULATORY_CARE_PROVIDER_SITE_OTHER): Payer: BLUE CROSS/BLUE SHIELD | Admitting: Obstetrics and Gynecology

## 2015-10-07 VITALS — BP 146/90 | HR 85 | Wt 234.0 lb

## 2015-10-07 DIAGNOSIS — Z36 Encounter for antenatal screening of mother: Secondary | ICD-10-CM

## 2015-10-07 DIAGNOSIS — O0993 Supervision of high risk pregnancy, unspecified, third trimester: Secondary | ICD-10-CM | POA: Diagnosis not present

## 2015-10-07 DIAGNOSIS — Z369 Encounter for antenatal screening, unspecified: Secondary | ICD-10-CM

## 2015-10-07 DIAGNOSIS — Z1389 Encounter for screening for other disorder: Secondary | ICD-10-CM

## 2015-10-07 LAB — POCT URINALYSIS DIPSTICK
BILIRUBIN UA: NEGATIVE
Glucose, UA: NEGATIVE
KETONES UA: NEGATIVE
LEUKOCYTES UA: NEGATIVE
Nitrite, UA: NEGATIVE
PH UA: 6
PROTEIN UA: NEGATIVE
RBC UA: NEGATIVE
SPEC GRAV UA: 1.025
Urobilinogen, UA: NEGATIVE

## 2015-10-07 NOTE — Progress Notes (Signed)
NONSTRESS TEST INTERPRETATION  INDICATIONS: Chronic Hypertension; Obesity  FHR baseline: 140 RESULTS: reactive COMMENTS: pt ankles, top of feet, top of hands-wrist swollen. No pitting edema. No protein in urine. UA negative. Pt denies any h/a, dizziness, blurred vision, spots before eyes or nausea. Pt informed if any of these symptoms occur to contact office to be seen. B/P 141/81, B/P 144/78 resulted while getting NST.    PLAN: 1. Continue fetal kick counts twice a day. 2. Continue antepartum testing as scheduled-Biweekly 3. Contact office if any s/s of elevated blood pressure.  Fenton Mallingebbie Shaquan Puerta, LPN

## 2015-10-07 NOTE — Progress Notes (Signed)
NST performed today was reviewed and was found to be reactive.  To notify MD immediately for any progressive PIH symptoms. Continue recommended antenatal testing and prenatal care.  Hildred LaserAnika Verdelle Valtierra, MD [facility]

## 2015-10-08 ENCOUNTER — Ambulatory Visit: Payer: BLUE CROSS/BLUE SHIELD | Admitting: Family Medicine

## 2015-10-11 ENCOUNTER — Other Ambulatory Visit: Payer: BLUE CROSS/BLUE SHIELD

## 2015-10-11 ENCOUNTER — Ambulatory Visit (INDEPENDENT_AMBULATORY_CARE_PROVIDER_SITE_OTHER): Payer: BLUE CROSS/BLUE SHIELD

## 2015-10-11 ENCOUNTER — Ambulatory Visit (INDEPENDENT_AMBULATORY_CARE_PROVIDER_SITE_OTHER): Payer: BLUE CROSS/BLUE SHIELD | Admitting: Obstetrics and Gynecology

## 2015-10-11 ENCOUNTER — Other Ambulatory Visit: Payer: Self-pay | Admitting: Obstetrics and Gynecology

## 2015-10-11 VITALS — BP 134/88 | HR 122 | Wt 237.5 lb

## 2015-10-11 DIAGNOSIS — O9989 Other specified diseases and conditions complicating pregnancy, childbirth and the puerperium: Secondary | ICD-10-CM

## 2015-10-11 DIAGNOSIS — Z283 Underimmunization status: Secondary | ICD-10-CM | POA: Diagnosis not present

## 2015-10-11 DIAGNOSIS — O99011 Anemia complicating pregnancy, first trimester: Secondary | ICD-10-CM

## 2015-10-11 DIAGNOSIS — O0993 Supervision of high risk pregnancy, unspecified, third trimester: Secondary | ICD-10-CM | POA: Diagnosis not present

## 2015-10-11 DIAGNOSIS — O09899 Supervision of other high risk pregnancies, unspecified trimester: Secondary | ICD-10-CM

## 2015-10-11 DIAGNOSIS — R825 Elevated urine levels of drugs, medicaments and biological substances: Secondary | ICD-10-CM | POA: Diagnosis not present

## 2015-10-11 NOTE — Progress Notes (Signed)
NONSTRESS TEST INTERPRETATION  INDICATIONS: HTN, Obesity   FHR baseline: 140-150 RESULTS:non reactive COMMENTS: Per MNS nst not reassuring. Pt needs BPP. Last growth scan 09/27/15. Contacted pt to schedule this afternoon.    PLAN: 1. Continue fetal kick counts twice a day. 2. Continue antepartum testing as scheduled-Biweekly 3. PIH precautions given  Darol Destinerystal Ladell Lea, CMA  Addendum:  BPP done today and has 8/8 score Reassured, and to keep appt on Monday for repeat NST.  Melody SheldahlShambley, CNM

## 2015-10-14 ENCOUNTER — Ambulatory Visit (INDEPENDENT_AMBULATORY_CARE_PROVIDER_SITE_OTHER): Payer: BLUE CROSS/BLUE SHIELD | Admitting: Obstetrics and Gynecology

## 2015-10-14 VITALS — BP 154/78 | HR 112 | Wt 234.4 lb

## 2015-10-14 DIAGNOSIS — Z369 Encounter for antenatal screening, unspecified: Secondary | ICD-10-CM

## 2015-10-14 DIAGNOSIS — O133 Gestational [pregnancy-induced] hypertension without significant proteinuria, third trimester: Secondary | ICD-10-CM | POA: Diagnosis not present

## 2015-10-14 DIAGNOSIS — Z1389 Encounter for screening for other disorder: Secondary | ICD-10-CM

## 2015-10-14 DIAGNOSIS — E669 Obesity, unspecified: Secondary | ICD-10-CM | POA: Diagnosis not present

## 2015-10-14 DIAGNOSIS — Z36 Encounter for antenatal screening of mother: Secondary | ICD-10-CM

## 2015-10-14 LAB — POCT URINALYSIS DIPSTICK
Bilirubin, UA: NEGATIVE
Blood, UA: NEGATIVE
GLUCOSE UA: NEGATIVE
Ketones, UA: NEGATIVE
LEUKOCYTES UA: NEGATIVE
NITRITE UA: NEGATIVE
PROTEIN UA: NEGATIVE
Spec Grav, UA: 1.025
UROBILINOGEN UA: NEGATIVE
pH, UA: 6

## 2015-10-14 NOTE — Progress Notes (Signed)
NONSTRESS TEST INTERPRETATION  INDICATIONS: HTN, Obesity  FHR baseline: 140 RESULTS: reactive COMMENTS:    PLAN: 1. Continue fetal kick counts twice a day. 2. Continue antepartum testing as scheduled-Biweekly   Fenton Mallingebbie Charlett Merkle, LPN

## 2015-10-15 ENCOUNTER — Other Ambulatory Visit: Payer: BLUE CROSS/BLUE SHIELD

## 2015-10-17 ENCOUNTER — Ambulatory Visit (INDEPENDENT_AMBULATORY_CARE_PROVIDER_SITE_OTHER): Payer: BLUE CROSS/BLUE SHIELD

## 2015-10-17 ENCOUNTER — Encounter: Payer: Self-pay | Admitting: Obstetrics and Gynecology

## 2015-10-17 ENCOUNTER — Ambulatory Visit: Payer: BLUE CROSS/BLUE SHIELD

## 2015-10-17 ENCOUNTER — Ambulatory Visit (INDEPENDENT_AMBULATORY_CARE_PROVIDER_SITE_OTHER): Payer: BLUE CROSS/BLUE SHIELD | Admitting: Obstetrics and Gynecology

## 2015-10-17 VITALS — BP 145/91 | HR 102 | Wt 235.2 lb

## 2015-10-17 DIAGNOSIS — O0993 Supervision of high risk pregnancy, unspecified, third trimester: Secondary | ICD-10-CM | POA: Diagnosis not present

## 2015-10-17 DIAGNOSIS — Z1389 Encounter for screening for other disorder: Secondary | ICD-10-CM

## 2015-10-17 DIAGNOSIS — O10913 Unspecified pre-existing hypertension complicating pregnancy, third trimester: Secondary | ICD-10-CM

## 2015-10-17 DIAGNOSIS — O99013 Anemia complicating pregnancy, third trimester: Secondary | ICD-10-CM

## 2015-10-17 DIAGNOSIS — E669 Obesity, unspecified: Secondary | ICD-10-CM

## 2015-10-17 DIAGNOSIS — Z36 Encounter for antenatal screening of mother: Secondary | ICD-10-CM

## 2015-10-17 DIAGNOSIS — Z369 Encounter for antenatal screening, unspecified: Secondary | ICD-10-CM

## 2015-10-17 DIAGNOSIS — E66811 Obesity, class 1: Secondary | ICD-10-CM

## 2015-10-17 DIAGNOSIS — Z113 Encounter for screening for infections with a predominantly sexual mode of transmission: Secondary | ICD-10-CM

## 2015-10-17 LAB — POCT URINALYSIS DIPSTICK
Bilirubin, UA: NEGATIVE
Glucose, UA: NEGATIVE
KETONES UA: NEGATIVE
NITRITE UA: NEGATIVE
PH UA: 6.5
PROTEIN UA: NEGATIVE
RBC UA: NEGATIVE
Spec Grav, UA: 1.025
Urobilinogen, UA: NEGATIVE

## 2015-10-17 NOTE — Progress Notes (Signed)
NONSTRESS TEST INTERPRETATION  INDICATIONS: CHRONIC HTN, OBESITY   FHR baseline: 140 RESULTS: REACTIVE COMMENTS:    PLAN: 1. Continue fetal kick counts twice a day. 2. Continue antepartum testing as scheduled-Biweekly   Fenton Mallingebbie Curtiss Mahmood, LPN

## 2015-10-17 NOTE — Progress Notes (Signed)
ROB: Denies complaints.  Doing well.  BPs mildly elevated, notes compliance with medications. No proteinuria. 36 week labs done today. NST performed today was reviewed and was found to be reactive.  Continue recommended antenatal testing and prenatal care. Still planning for IOL around 11/08/15.  Will schedule in next 1-2 weeks. Will recheck Hgb for anemia in pregnancy.  RTC in 1 week.

## 2015-10-17 NOTE — Progress Notes (Signed)
NONSTRESS TEST INTERPRETATION  INDICATIONS:   FHR baseline:  RESULTS: COMMENTS:    PLAN: 1. Continue fetal kick counts twice a day. 2. Continue antepartum testing as scheduled-Biweekly 3.  Fenton Mallingebbie Fares Ramthun, LPN  ERROR--DUPLICATE

## 2015-10-18 LAB — HEMOGLOBIN AND HEMATOCRIT, BLOOD
HEMOGLOBIN: 11.9 g/dL (ref 11.1–15.9)
Hematocrit: 34.3 % (ref 34.0–46.6)

## 2015-10-19 LAB — GC/CHLAMYDIA PROBE AMP
Chlamydia trachomatis, NAA: NEGATIVE
NEISSERIA GONORRHOEAE BY PCR: NEGATIVE

## 2015-10-21 ENCOUNTER — Ambulatory Visit: Payer: BLUE CROSS/BLUE SHIELD

## 2015-10-21 LAB — CULTURE, BETA STREP (GROUP B ONLY): STREP GP B CULTURE: NEGATIVE

## 2015-10-24 ENCOUNTER — Ambulatory Visit: Payer: BLUE CROSS/BLUE SHIELD | Admitting: Obstetrics and Gynecology

## 2015-10-24 ENCOUNTER — Ambulatory Visit (INDEPENDENT_AMBULATORY_CARE_PROVIDER_SITE_OTHER): Payer: BLUE CROSS/BLUE SHIELD | Admitting: Obstetrics and Gynecology

## 2015-10-24 ENCOUNTER — Encounter: Payer: Self-pay | Admitting: Obstetrics and Gynecology

## 2015-10-24 VITALS — BP 154/96 | HR 103 | Wt 239.1 lb

## 2015-10-24 DIAGNOSIS — Z36 Encounter for antenatal screening of mother: Secondary | ICD-10-CM | POA: Diagnosis not present

## 2015-10-24 DIAGNOSIS — Z369 Encounter for antenatal screening, unspecified: Secondary | ICD-10-CM

## 2015-10-24 DIAGNOSIS — R825 Elevated urine levels of drugs, medicaments and biological substances: Secondary | ICD-10-CM

## 2015-10-24 DIAGNOSIS — O99011 Anemia complicating pregnancy, first trimester: Secondary | ICD-10-CM

## 2015-10-24 DIAGNOSIS — Z283 Underimmunization status: Secondary | ICD-10-CM

## 2015-10-24 DIAGNOSIS — O9989 Other specified diseases and conditions complicating pregnancy, childbirth and the puerperium: Principal | ICD-10-CM

## 2015-10-24 DIAGNOSIS — O09899 Supervision of other high risk pregnancies, unspecified trimester: Secondary | ICD-10-CM

## 2015-10-24 DIAGNOSIS — Z1389 Encounter for screening for other disorder: Secondary | ICD-10-CM

## 2015-10-24 LAB — POCT URINALYSIS DIPSTICK
BILIRUBIN UA: NEGATIVE
GLUCOSE UA: NEGATIVE
KETONES UA: NEGATIVE
Leukocytes, UA: NEGATIVE
Nitrite, UA: NEGATIVE
SPEC GRAV UA: 1.015
Urobilinogen, UA: NEGATIVE
pH, UA: 6

## 2015-10-24 MED ORDER — METHYLDOPA 500 MG PO TABS: 500.0000 mg | ORAL_TABLET | Freq: Three times a day (TID) | ORAL | Status: DC

## 2015-10-24 NOTE — Progress Notes (Signed)
NST performed today was reviewed and was found to be reactive.  Continue recommended antenatal testing and prenatal care.  

## 2015-10-24 NOTE — Progress Notes (Signed)
ROB- reviewed NST, denies s/s PIH- increased methyldopa to 500mg  tid, and reviewed last weeks labs with patient. Will add f/u BPP to next weeks NST.

## 2015-10-24 NOTE — Progress Notes (Signed)
NONSTRESS TEST INTERPRETATION  INDICATIONS: Chronic HTN, Obesity  FHR baseline: 150 RESULTS: reactive COMMENTS:    PLAN: 1. Continue fetal kick counts twice a day. 2. Continue antepartum testing as scheduled-Biweekly   Fenton Mallingebbie Mateus Rewerts, LPN

## 2015-10-27 NOTE — Progress Notes (Signed)
NST performed today was reviewed and was found to be reactive.  Continue recommended antenatal testing and prenatal care.  

## 2015-10-28 ENCOUNTER — Ambulatory Visit (INDEPENDENT_AMBULATORY_CARE_PROVIDER_SITE_OTHER): Payer: BLUE CROSS/BLUE SHIELD | Admitting: Obstetrics and Gynecology

## 2015-10-28 ENCOUNTER — Other Ambulatory Visit: Payer: Self-pay | Admitting: Obstetrics and Gynecology

## 2015-10-28 VITALS — BP 146/84 | HR 122 | Wt 240.6 lb

## 2015-10-28 DIAGNOSIS — E669 Obesity, unspecified: Secondary | ICD-10-CM | POA: Diagnosis not present

## 2015-10-28 DIAGNOSIS — Z36 Encounter for antenatal screening of mother: Secondary | ICD-10-CM

## 2015-10-28 DIAGNOSIS — O133 Gestational [pregnancy-induced] hypertension without significant proteinuria, third trimester: Secondary | ICD-10-CM

## 2015-10-28 DIAGNOSIS — O10913 Unspecified pre-existing hypertension complicating pregnancy, third trimester: Secondary | ICD-10-CM

## 2015-10-28 DIAGNOSIS — Z369 Encounter for antenatal screening, unspecified: Secondary | ICD-10-CM

## 2015-10-28 DIAGNOSIS — Z1389 Encounter for screening for other disorder: Secondary | ICD-10-CM

## 2015-10-28 LAB — POCT URINALYSIS DIPSTICK
BILIRUBIN UA: NEGATIVE
Blood, UA: NEGATIVE
GLUCOSE UA: NEGATIVE
KETONES UA: NEGATIVE
Leukocytes, UA: NEGATIVE
Nitrite, UA: NEGATIVE
SPEC GRAV UA: 1.02
Urobilinogen, UA: 0.2
pH, UA: 6

## 2015-10-28 NOTE — Progress Notes (Signed)
NONSTRESS TEST INTERPRETATION  INDICATIONS: Chronic HTN, OBESITY  Blood pressure: 146/84  FHR baseline: 150 RESULTS: REACTIVE COMMENTS: NOTED LESS FETAL MOVEMENT OVER WEEKEND.   PLAN: 1. Continue fetal kick counts twice a day. 2. Continue antepartum testing as scheduled-Biweekly   Fenton Mallingebbie [place], LPN Herold HarmsMartin A Mazie Fencl, MD

## 2015-10-30 NOTE — Patient Instructions (Signed)
1. Continue twice weekly NSTsscheduled

## 2015-10-31 ENCOUNTER — Ambulatory Visit (INDEPENDENT_AMBULATORY_CARE_PROVIDER_SITE_OTHER): Payer: BLUE CROSS/BLUE SHIELD

## 2015-10-31 ENCOUNTER — Inpatient Hospital Stay
Admission: EM | Admit: 2015-10-31 | Discharge: 2015-10-31 | Disposition: A | Discharge status: Home / Self Care | Payer: BLUE CROSS/BLUE SHIELD | Attending: Obstetrics and Gynecology | Admitting: Obstetrics and Gynecology

## 2015-10-31 ENCOUNTER — Other Ambulatory Visit: Payer: BLUE CROSS/BLUE SHIELD

## 2015-10-31 ENCOUNTER — Ambulatory Visit (INDEPENDENT_AMBULATORY_CARE_PROVIDER_SITE_OTHER): Payer: BLUE CROSS/BLUE SHIELD | Admitting: Obstetrics and Gynecology

## 2015-10-31 VITALS — BP 144/96 | HR 103 | Wt 240.8 lb

## 2015-10-31 DIAGNOSIS — O163 Unspecified maternal hypertension, third trimester: Secondary | ICD-10-CM | POA: Insufficient documentation

## 2015-10-31 DIAGNOSIS — O133 Gestational [pregnancy-induced] hypertension without significant proteinuria, third trimester: Secondary | ICD-10-CM

## 2015-10-31 DIAGNOSIS — I10 Essential (primary) hypertension: Secondary | ICD-10-CM | POA: Insufficient documentation

## 2015-10-31 DIAGNOSIS — O0993 Supervision of high risk pregnancy, unspecified, third trimester: Secondary | ICD-10-CM

## 2015-10-31 DIAGNOSIS — Z3A37 37 weeks gestation of pregnancy: Secondary | ICD-10-CM | POA: Insufficient documentation

## 2015-10-31 DIAGNOSIS — O1213 Gestational proteinuria, third trimester: Secondary | ICD-10-CM | POA: Insufficient documentation

## 2015-10-31 DIAGNOSIS — O10913 Unspecified pre-existing hypertension complicating pregnancy, third trimester: Secondary | ICD-10-CM

## 2015-10-31 DIAGNOSIS — R809 Proteinuria, unspecified: Secondary | ICD-10-CM

## 2015-10-31 LAB — CBC WITH DIFFERENTIAL/PLATELET
BASOS ABS: 0 10*3/uL (ref 0–0.1)
BASOS PCT: 0 %
EOS PCT: 0 %
Eosinophils Absolute: 0 10*3/uL (ref 0–0.7)
HCT: 34.7 % — ABNORMAL LOW (ref 35.0–47.0)
Hemoglobin: 12 g/dL (ref 12.0–16.0)
LYMPHS PCT: 12 %
Lymphs Abs: 1.2 10*3/uL (ref 1.0–3.6)
MCH: 36.3 pg — ABNORMAL HIGH (ref 26.0–34.0)
MCHC: 34.5 g/dL (ref 32.0–36.0)
MCV: 105.1 fL — AB (ref 80.0–100.0)
Monocytes Absolute: 0.5 10*3/uL (ref 0.2–0.9)
Monocytes Relative: 5 %
Neutro Abs: 8.5 10*3/uL — ABNORMAL HIGH (ref 1.4–6.5)
Neutrophils Relative %: 83 %
PLATELETS: 200 10*3/uL (ref 150–440)
RBC: 3.3 MIL/uL — ABNORMAL LOW (ref 3.80–5.20)
RDW: 14.5 % (ref 11.5–14.5)
WBC: 10.3 10*3/uL (ref 3.6–11.0)

## 2015-10-31 LAB — COMPREHENSIVE METABOLIC PANEL
ALT: 13 U/L — ABNORMAL LOW (ref 14–54)
ANION GAP: 9 (ref 5–15)
AST: 20 U/L (ref 15–41)
Albumin: 3.1 g/dL — ABNORMAL LOW (ref 3.5–5.0)
Alkaline Phosphatase: 119 U/L (ref 38–126)
BUN: 8 mg/dL (ref 6–20)
CHLORIDE: 108 mmol/L (ref 101–111)
CO2: 19 mmol/L — ABNORMAL LOW (ref 22–32)
Calcium: 9.2 mg/dL (ref 8.9–10.3)
Creatinine, Ser: 0.54 mg/dL (ref 0.44–1.00)
Glucose, Bld: 103 mg/dL — ABNORMAL HIGH (ref 65–99)
POTASSIUM: 3.9 mmol/L (ref 3.5–5.1)
Sodium: 136 mmol/L (ref 135–145)
Total Bilirubin: 0.4 mg/dL (ref 0.3–1.2)
Total Protein: 6.4 g/dL — ABNORMAL LOW (ref 6.5–8.1)

## 2015-10-31 LAB — POCT URINALYSIS DIPSTICK
Bilirubin, UA: NEGATIVE
Blood, UA: NEGATIVE
Glucose, UA: NEGATIVE
Ketones, UA: NEGATIVE
LEUKOCYTES UA: NEGATIVE
Nitrite, UA: NEGATIVE
PH UA: 6
Spec Grav, UA: >=1.03

## 2015-10-31 LAB — PROTEIN / CREATININE RATIO, URINE
Creatinine, Urine: 224 mg/dL
PROTEIN CREATININE RATIO: 0.07 mg/mg{creat} (ref 0.00–0.15)
TOTAL PROTEIN, URINE: 15 mg/dL

## 2015-10-31 NOTE — OB Triage Note (Signed)
Patient [facility] from Dr[facility], elevated BP's. Sent for PIH eval. Yvone NeuK. Leelynn Whetsel RN

## 2015-10-31 NOTE — Discharge Instructions (Signed)
Keep regularly scheduled NST and Blood Pressure check for Monday. Continue to take medication as ordered : Aldomet 500mg  three times daily. Return to hospital for decreased fetal movement, contractions, leaking of fluid, or vaginal bleeding.  Call provider or [facility] [phone] for headache, blurry vision, floaters, visual disturbances, epigastric pain, or increased swelling.

## 2015-10-31 NOTE — Progress Notes (Signed)
GROWTH SCAN: Ultrasound today demonstrates 30th percentile growth with estimated fetal weight 3046 g this is 6 lbs. 11 oz.; AFI is 17.1 cm. Placenta is anterior, grade 1-2, and remote to cervix PRENATAL VISIT:  Blood pressure 140/96; urine protein 1+  NONSTRESS TEST INTERPRETATION  INDICATIONS: Chronic Hypertension ; 1+ proteinuria  FHR baseline: 150bpm RESULTS: Reactive COMMENTS: Growth scan today -normal   PLAN: 1. Continue fetal kick counts twice a day. 2. Continue antepartum testing as scheduled-Biweekly 3. Patient sent to labor and delivery for preeclampsia panel including urine protein creatinine ratio, and further monitoring to assess for need for possible induction of labor due to superimposed preeclampsia on chronic hypertension  Lori Gross, CMA  Herold HarmsMartin A Bessie Boyte, MD   Addendum: Preeclampsia panel negative; urine protein creatinine ratio-66.0 (normal)

## 2015-10-31 NOTE — OB Triage Note (Signed)
Patient given verbal and written discharge instructions. Patient to keep regularly scheduled appointment on Monday. Discussed labor precautions, fetal kick count instructions, and PIH precautions at length. Patient verbalized understanding. Yvone NeuK. Tametha Banning RN

## 2015-11-04 ENCOUNTER — Ambulatory Visit (INDEPENDENT_AMBULATORY_CARE_PROVIDER_SITE_OTHER): Payer: BLUE CROSS/BLUE SHIELD | Admitting: Obstetrics and Gynecology

## 2015-11-04 VITALS — BP 154/87 | HR 101 | Wt 240.1 lb

## 2015-11-04 DIAGNOSIS — Z36 Encounter for antenatal screening of mother: Secondary | ICD-10-CM

## 2015-11-04 DIAGNOSIS — Z1389 Encounter for screening for other disorder: Secondary | ICD-10-CM

## 2015-11-04 DIAGNOSIS — O0993 Supervision of high risk pregnancy, unspecified, third trimester: Secondary | ICD-10-CM

## 2015-11-04 DIAGNOSIS — O9989 Other specified diseases and conditions complicating pregnancy, childbirth and the puerperium: Secondary | ICD-10-CM

## 2015-11-04 DIAGNOSIS — R825 Elevated urine levels of drugs, medicaments and biological substances: Secondary | ICD-10-CM

## 2015-11-04 DIAGNOSIS — Z369 Encounter for antenatal screening, unspecified: Secondary | ICD-10-CM

## 2015-11-04 DIAGNOSIS — O99011 Anemia complicating pregnancy, first trimester: Secondary | ICD-10-CM

## 2015-11-04 DIAGNOSIS — Z283 Underimmunization status: Secondary | ICD-10-CM

## 2015-11-04 DIAGNOSIS — O10913 Unspecified pre-existing hypertension complicating pregnancy, third trimester: Secondary | ICD-10-CM

## 2015-11-04 DIAGNOSIS — O09899 Supervision of other high risk pregnancies, unspecified trimester: Secondary | ICD-10-CM

## 2015-11-04 LAB — POCT URINALYSIS DIPSTICK
BILIRUBIN UA: NEGATIVE
Blood, UA: NEGATIVE
Glucose, UA: NEGATIVE
KETONES UA: NEGATIVE
Leukocytes, UA: NEGATIVE
Nitrite, UA: NEGATIVE
PROTEIN UA: NEGATIVE
SPEC GRAV UA: 1.025
Urobilinogen, UA: 0.2
pH, UA: 6.5

## 2015-11-04 NOTE — Progress Notes (Signed)
NONSTRESS TEST INTERPRETATION  INDICATIONS: Chronic Hypertension  FHR baseline: 120-130 RESULTS: reactive COMMENTS:    PLAN: 1. Continue fetal kick counts twice a day. 2. Continue antepartum testing as scheduled-Biweekly   Fenton Mallingebbie Rayvion Stumph, LPN

## 2015-11-07 ENCOUNTER — Inpatient Hospital Stay
Admission: RE | Admit: 2015-11-07 | Discharge: 2015-11-10 | DRG: 774 | Disposition: A | Discharge status: Home / Self Care | Payer: BLUE CROSS/BLUE SHIELD | Source: Ambulatory Visit | Attending: Obstetrics and Gynecology | Admitting: Obstetrics and Gynecology

## 2015-11-07 ENCOUNTER — Encounter: Payer: Self-pay | Admitting: Obstetrics and Gynecology

## 2015-11-07 ENCOUNTER — Other Ambulatory Visit: Payer: BLUE CROSS/BLUE SHIELD

## 2015-11-07 ENCOUNTER — Ambulatory Visit (INDEPENDENT_AMBULATORY_CARE_PROVIDER_SITE_OTHER): Payer: BLUE CROSS/BLUE SHIELD | Admitting: Obstetrics and Gynecology

## 2015-11-07 VITALS — BP 153/103 | HR 98 | Wt 242.1 lb

## 2015-11-07 DIAGNOSIS — O10913 Unspecified pre-existing hypertension complicating pregnancy, third trimester: Secondary | ICD-10-CM | POA: Diagnosis present

## 2015-11-07 DIAGNOSIS — Z23 Encounter for immunization: Secondary | ICD-10-CM

## 2015-11-07 DIAGNOSIS — Z8051 Family history of malignant neoplasm of kidney: Secondary | ICD-10-CM

## 2015-11-07 DIAGNOSIS — O1092 Unspecified pre-existing hypertension complicating childbirth: Secondary | ICD-10-CM | POA: Diagnosis present

## 2015-11-07 DIAGNOSIS — O0993 Supervision of high risk pregnancy, unspecified, third trimester: Secondary | ICD-10-CM

## 2015-11-07 DIAGNOSIS — Z8249 Family history of ischemic heart disease and other diseases of the circulatory system: Secondary | ICD-10-CM

## 2015-11-07 DIAGNOSIS — Z7982 Long term (current) use of aspirin: Secondary | ICD-10-CM | POA: Diagnosis not present

## 2015-11-07 DIAGNOSIS — Z3A39 39 weeks gestation of pregnancy: Secondary | ICD-10-CM

## 2015-11-07 DIAGNOSIS — O26899 Other specified pregnancy related conditions, unspecified trimester: Secondary | ICD-10-CM

## 2015-11-07 DIAGNOSIS — Z3403 Encounter for supervision of normal first pregnancy, third trimester: Secondary | ICD-10-CM | POA: Diagnosis not present

## 2015-11-07 DIAGNOSIS — E669 Obesity, unspecified: Secondary | ICD-10-CM

## 2015-11-07 DIAGNOSIS — O9081 Anemia of the puerperium: Secondary | ICD-10-CM | POA: Diagnosis not present

## 2015-11-07 DIAGNOSIS — O360130 Maternal care for anti-D [Rh] antibodies, third trimester, not applicable or unspecified: Secondary | ICD-10-CM

## 2015-11-07 DIAGNOSIS — Z369 Encounter for antenatal screening, unspecified: Secondary | ICD-10-CM

## 2015-11-07 DIAGNOSIS — Z79899 Other long term (current) drug therapy: Secondary | ICD-10-CM

## 2015-11-07 DIAGNOSIS — O10919 Unspecified pre-existing hypertension complicating pregnancy, unspecified trimester: Secondary | ICD-10-CM | POA: Diagnosis present

## 2015-11-07 DIAGNOSIS — R109 Unspecified abdominal pain: Secondary | ICD-10-CM

## 2015-11-07 DIAGNOSIS — Z36 Encounter for antenatal screening of mother: Secondary | ICD-10-CM

## 2015-11-07 DIAGNOSIS — Z1389 Encounter for screening for other disorder: Secondary | ICD-10-CM

## 2015-11-07 LAB — CBC
HEMATOCRIT: 34.7 % — AB (ref 35.0–47.0)
HEMOGLOBIN: 12.2 g/dL (ref 12.0–16.0)
MCH: 37 pg — ABNORMAL HIGH (ref 26.0–34.0)
MCHC: 35.2 g/dL (ref 32.0–36.0)
MCV: 105.3 fL — ABNORMAL HIGH (ref 80.0–100.0)
Platelets: 214 10*3/uL (ref 150–440)
RBC: 3.29 MIL/uL — AB (ref 3.80–5.20)
RDW: 14.6 % — ABNORMAL HIGH (ref 11.5–14.5)
WBC: 10.9 10*3/uL (ref 3.6–11.0)

## 2015-11-07 LAB — URINE DRUG SCREEN, QUALITATIVE (ARMC ONLY)
AMPHETAMINES, UR SCREEN: NOT DETECTED
BENZODIAZEPINE, UR SCRN: NOT DETECTED
Barbiturates, Ur Screen: NOT DETECTED
CANNABINOID 50 NG, UR ~~LOC~~: NOT DETECTED
Cocaine Metabolite,Ur ~~LOC~~: NOT DETECTED
MDMA (Ecstasy)Ur Screen: NOT DETECTED
Methadone Scn, Ur: NOT DETECTED
Opiate, Ur Screen: NOT DETECTED
PHENCYCLIDINE (PCP) UR S: NOT DETECTED
Tricyclic, Ur Screen: NOT DETECTED

## 2015-11-07 LAB — COMPREHENSIVE METABOLIC PANEL
ALBUMIN: 3.3 g/dL — AB (ref 3.5–5.0)
ALK PHOS: 131 U/L — AB (ref 38–126)
ALT: 13 U/L — AB (ref 14–54)
ANION GAP: 8 (ref 5–15)
AST: 23 U/L (ref 15–41)
BILIRUBIN TOTAL: 0.6 mg/dL (ref 0.3–1.2)
BUN: 8 mg/dL (ref 6–20)
CALCIUM: 9 mg/dL (ref 8.9–10.3)
CO2: 21 mmol/L — AB (ref 22–32)
Chloride: 106 mmol/L (ref 101–111)
Creatinine, Ser: 0.52 mg/dL (ref 0.44–1.00)
GFR calc Af Amer: >60 mL/min (ref >60)
GFR calc non Af Amer: >60 mL/min (ref >60)
GLUCOSE: 71 mg/dL (ref 65–99)
Potassium: 4.1 mmol/L (ref 3.5–5.1)
SODIUM: 135 mmol/L (ref 135–145)
Total Protein: 6.8 g/dL (ref 6.5–8.1)

## 2015-11-07 LAB — PROTEIN / CREATININE RATIO, URINE
Creatinine, Urine: 73 mg/dL
PROTEIN CREATININE RATIO: 0.14 mg/mg{creat} (ref 0.00–0.15)
TOTAL PROTEIN, URINE: 10 mg/dL

## 2015-11-07 LAB — POCT URINALYSIS DIPSTICK
Bilirubin, UA: NEGATIVE
Glucose, UA: NEGATIVE
KETONES UA: NEGATIVE
Nitrite, UA: NEGATIVE
PH UA: 6
SPEC GRAV UA: 1.025
Urobilinogen, UA: NEGATIVE

## 2015-11-07 MED ORDER — OXYTOCIN 10 UNIT/ML IJ SOLN
INTRAMUSCULAR | Status: AC
  Filled 2015-11-07: qty 2

## 2015-11-07 MED ORDER — MISOPROSTOL 200 MCG PO TABS
ORAL_TABLET | ORAL | Status: AC
  Filled 2015-11-07: qty 4

## 2015-11-07 MED ORDER — LACTATED RINGERS IV SOLN
INTRAVENOUS | Status: DC
  Administered 2015-11-07: 1000 mL via INTRAVENOUS
  Administered 2015-11-07: 125 mL/h via INTRAVENOUS

## 2015-11-07 MED ORDER — LACTATED RINGERS IV SOLN: 500.0000 mL | INTRAVENOUS | Status: DC | PRN

## 2015-11-07 MED ORDER — ACETAMINOPHEN 325 MG PO TABS: 650.0000 mg | ORAL_TABLET | ORAL | Status: DC | PRN

## 2015-11-07 MED ORDER — LABETALOL HCL 5 MG/ML IV SOLN
20.0000 mg | INTRAVENOUS | Status: AC | PRN
  Administered 2015-11-07: 20 mg via INTRAVENOUS
  Administered 2015-11-07: 80 mg via INTRAVENOUS
  Administered 2015-11-07: 40 mg via INTRAVENOUS
  Filled 2015-11-07 (×2): qty 16

## 2015-11-07 MED ORDER — OXYTOCIN 40 UNITS IN LACTATED RINGERS INFUSION - SIMPLE MED
1.0000 m[IU]/min | INTRAVENOUS | Status: DC
  Administered 2015-11-07: 1 m[IU]/min via INTRAVENOUS
  Filled 2015-11-07: qty 1000

## 2015-11-07 MED ORDER — BUTORPHANOL TARTRATE 1 MG/ML IJ SOLN
1.0000 mg | INTRAMUSCULAR | Status: DC | PRN
  Administered 2015-11-07 (×2): 1 mg via INTRAVENOUS
  Filled 2015-11-07 (×3): qty 1

## 2015-11-07 MED ORDER — HYDRALAZINE HCL 20 MG/ML IJ SOLN
10.0000 mg | Freq: Once | INTRAMUSCULAR | Status: AC | PRN
  Administered 2015-11-08: 10 mg via INTRAVENOUS
  Filled 2015-11-07: qty 1

## 2015-11-07 MED ORDER — OXYCODONE-ACETAMINOPHEN 5-325 MG PO TABS: 1.0000 | ORAL_TABLET | ORAL | Status: DC | PRN

## 2015-11-07 MED ORDER — TERBUTALINE SULFATE 1 MG/ML IJ SOLN: 0.2500 mg | Freq: Once | INTRAMUSCULAR | Status: DC | PRN

## 2015-11-07 MED ORDER — LIDOCAINE HCL (PF) 1 % IJ SOLN
30.0000 mL | INTRAMUSCULAR | Status: DC | PRN
  Filled 2015-11-07: qty 30

## 2015-11-07 MED ORDER — METHYLDOPA 250 MG PO TABS
250.0000 mg | ORAL_TABLET | Freq: Once | ORAL | Status: AC
  Administered 2015-11-08: 250 mg via ORAL
  Filled 2015-11-07: qty 1

## 2015-11-07 MED ORDER — ONDANSETRON HCL 4 MG/2ML IJ SOLN: 4.0000 mg | Freq: Four times a day (QID) | INTRAMUSCULAR | Status: DC | PRN

## 2015-11-07 MED ORDER — AMMONIA AROMATIC IN INHA
RESPIRATORY_TRACT | Status: AC
  Filled 2015-11-07: qty 10

## 2015-11-07 MED ORDER — SOD CITRATE-CITRIC ACID 500-334 MG/5ML PO SOLN
30.0000 mL | ORAL | Status: DC | PRN
Start: 2015-11-07 — End: 2015-11-08

## 2015-11-07 MED ORDER — OXYTOCIN BOLUS FROM INFUSION
500.0000 mL | INTRAVENOUS | Status: DC
  Administered 2015-11-08: 500 mL via INTRAVENOUS

## 2015-11-07 MED ORDER — OXYCODONE-ACETAMINOPHEN 5-325 MG PO TABS: 2.0000 | ORAL_TABLET | ORAL | Status: DC | PRN

## 2015-11-07 MED ORDER — OXYTOCIN 40 UNITS IN LACTATED RINGERS INFUSION - SIMPLE MED
2.5000 [IU]/h | INTRAVENOUS | Status: DC
Start: 2015-11-07 — End: 2015-11-08
  Administered 2015-11-08: 2.5 [IU]/h via INTRAVENOUS

## 2015-11-07 NOTE — Progress Notes (Signed)
Intrapartum Progress Note  S: Patient complains of moderate pain with contractions.   Ceasar Mons:  Filed Vitals:   11/07/15 2112 11/07/15 2137 11/07/15 2153 11/07/15 2237  BP: 149/122 160/110 149/83 152/134  Pulse: 71 77 89   Temp:      TempSrc:      Resp: 18 20 18    Height:      Weight:         Gen App: mild to moderate distress with contractions.  Abdomen: soft, gravid FHT: baseline 140 bpm.  Accels present.  Decels absent. moderate in degree variability.   Tocometer: contractions q 2-4 minutes Cervix: 5.5/80/c/-1/intact Extremities: Nontender, no edema.  Pitocin: 3 miU  Labs:  No new labs.   Assessment:  1: SIUP at 4555w6d 2. Chronic HTN in pregnancy  Plan:  1. Continue IOL with Pitocin 2. Continue Labetalol per protocol, as needed 3. IV pain meds for pain, currently declines epidural.   Hildred LaserAnika Jordy Verba, MD [facility] 11/07/2015 10:43 PM    Hildred LaserAnika Jerrian Mells, MD 11/07/2015 10:40 PM

## 2015-11-07 NOTE — Progress Notes (Signed)
Dr Valentino Saxonherry given update, and stating pt may now have her Labetalol 80 mg IV, and start Aldomet as ordered. If no result from Labetalol move to hydralazine order

## 2015-11-07 NOTE — Progress Notes (Signed)
Dr Valentino Saxonherry given update. AS well aware pt wanting to walk, but educated re BP Hx contraindication, pt trying bedside rocking chair, with some success. Breathing now fairly well with u/c's. BP generally recently 140-150's over 90's with present elevated one. Dr Valentino Saxonherry states to hold 80 mg dose Labetalol presently and will be in to see pt in about an hour

## 2015-11-07 NOTE — OB Triage Note (Signed)
[facility]

## 2015-11-07 NOTE — H&P (Addendum)
Obstetric History and Physical  Lori Gross is a 20 y.o. G1P0 with IUP at [redacted]w[redacted]d presenting for IOL for uncontrolled BP with h/o chronic HTN in pregnancy (on Aldomet 250 mg BID). Patient states she has also been having  irregular contractions x 2 days, progressively getting stronger.  minimal vaginal bleeding, intact membranes, with active fetal movement.    Prenatal Course Source of Care: [facility] with onset of care at 10 weeks Pregnancy complications or risks: Patient Active Problem List   Diagnosis Date Noted  . Chronic hypertension complicating or reason for care during pregnancy 11/07/2015  . Proteinuria 11/01/2015  . Rh negative state in antepartum period 08/27/2015  . Supervision of high risk pregnancy in third trimester 06/06/2015  . Rubella non-immune status, antepartum 04/23/2015  . Anemia of pregnancy in first trimester 04/23/2015  . Maternal varicella, non-immune 03/26/2015  . Positive urine drug screen 03/25/2015  . Left breast mass 03/11/2015  . Dysmenorrhea 10/08/2014  . Chronic hypertension in pregnancy 10/08/2014  . 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 Microalbuminuria 10/08/2014  . Obesity (BMI 30.0-34.9) 10/08/2014  . Migraine without aura and responsive to treatment 10/08/2014   She plans to breastfeed She desires oral contraceptives (estrogen/progesterone) for postpartum contraception.   Prenatal labs and studies: ABO, Rh: A/Negative/-- (12/01 1043) Antibody: Negative (12/01 1043) Rubella: <20.0 (12/01 1043) RPR: Non Reactive (12/01 1043)  HBsAg: Negative (12/01 1043)  HIV: Non Reactive (11/01 1040)  GBS: Negative (06/29  1100) 1 hr Glucola  Normal (113) Genetic screening normal Anatomy US normal   Past Medical History  Diagnosis Date  . Grieving   . Hypertension   . Microalbuminuria   . Benign headache   . Migraine without aura and without status migrainosus, not intractable   . Dysmenorrhea   . Obesity   . Urticaria   .  Marijuana use 2016    positive UDS    No past surgical history on file.  OB History  Gravida Para Term Preterm AB SAB TAB Ectopic Multiple Living  1             # Outcome Date GA Lbr Len/2nd Weight Sex Delivery Anes PTL Lv  1 Current             Obstetric Comments  Menstrual age: 6    Age 1st Pregnancy: 21    Social History   Social History  . Marital Status: Single    Spouse Name: N/A  . Number of Children: N/A  . Years of Education: N/A   Social History Main Topics  . Smoking status: Never Smoker   . Smokeless tobacco: Never Used  . Alcohol Use: No  . Drug Use: No  . Sexual Activity:    Partners: Male    Birth Control/ Protection: Pill   Other Topics Concern  . Not on file   Social History Narrative    Family History  Problem Relation Age of Onset  . Hypertension Mother   . Cancer Mother 10    Common Bile Duct  . Hypertension Sister   . Hypertension Brother   . Diabetes Paternal Grandmother   . Cancer Paternal Grandmother 32    brain cancer  . Diabetes Paternal Grandfather   . Hypertension Father   . Cancer Father 37    kidney cancer    Prescriptions prior to admission  Medication Sig Dispense Refill Last Dose  . aspirin EC 81 MG tablet Take 1 tablet (81 mg total) by mouth daily. Take  after 12 weeks for prevention of preeclampssia later in pregnancy 300 tablet 2 11/07/2015 at Unknown time  . ferrous sulfate 325 (65 FE) MG tablet Take 1 tablet (325 mg total) by mouth 2 (two) times daily with a meal. 60 tablet 5 11/07/2015 at Unknown time  . methyldopa (ALDOMET) 250 MG tablet Take 250 mg by mouth 3 (three) times daily.   11/07/2015 at Unknown time  . prenatal vitamin w/FE, FA (NATACHEW) 29-1 MG CHEW chewable tablet Chew 1 tablet by mouth daily at 12 noon. 30 tablet 0 11/07/2015 at Unknown time  . methyldopa (ALDOMET) 500 MG tablet Take 1 tablet (500 mg total) by mouth 3 (three) times daily. (Patient not taking: Reported on 10/31/2015) 90 tablet 1 Not Taking  at Unknown time    No Known Allergies  Review of Systems: Negative except for what is mentioned in HPI.  Physical Exam: Filed Vitals:   11/07/15 1707 11/07/15 1847  BP: 142/104 148/96  Pulse: 105 100  Temp: 98.7 F (37.1 C)   TempSrc: Oral   Resp: 18   Height: 5\' 4"  (1.626 m)   Weight: 242 lb (109.77 kg)    CONSTITUTIONAL: Well-developed, well-nourished female in no acute distress.  HENT:  Normocephalic, atraumatic, External right and left ear normal. Oropharynx is clear and moist EYES: Conjunctivae and EOM are normal. Pupils are equal, round, and reactive to light. No scleral icterus.  NECK: Normal range of motion, supple, no masses SKIN: Skin is warm and dry. No rash noted. Not diaphoretic. No erythema. No pallor. NEUROLOGIC: Alert and oriented to person, place, and time. Normal reflexes, muscle tone coordination. No cranial nerve deficit noted. PSYCHIATRIC: Normal mood and affect. Normal behavior. Normal judgment and thought content. CARDIOVASCULAR: Normal heart rate noted, regular rhythm RESPIRATORY: Effort and breath sounds normal, no problems with respiration noted ABDOMEN: Soft, nontender, nondistended, gravid. MUSCULOSKELETAL: Normal range of motion. No edema and no tenderness. 2+ distal pulses.  Cervical Exam: Dilatation 4 cm   Effacement 70%   Station -2   Presentation: cephalic FHT:  Baseline rate 140 bpm   Variability moderate  Accelerations present   Decelerations none Contractions: Irregular contractions   Pertinent Labs/Studies:   Results for orders placed or performed during the hospital encounter of 11/07/15 (from the past 24 hour(s))  CBC     Status: Abnormal   Collection Time: 11/07/15  5:43 PM  Result Value Ref Range   WBC 10.9 3.6 - 11.0 K/uL   RBC 3.29 (L) 3.80 - 5.20 MIL/uL   Hemoglobin 12.2 12.0 - 16.0 g/dL   HCT 04.5 (L) 40.9 - 81.1 %   MCV 105.3 (H) 80.0 - 100.0 fL   MCH 37.0 (H) 26.0 - 34.0 pg   MCHC 35.2 32.0 - 36.0 g/dL   RDW 91.4 (H) 78.2  - 14.5 %   Platelets 214 150 - 440 K/uL  Comprehensive metabolic panel     Status: Abnormal   Collection Time: 11/07/15  5:43 PM  Result Value Ref Range   Sodium 135 135 - 145 mmol/L   Potassium 4.1 3.5 - 5.1 mmol/L   Chloride 106 101 - 111 mmol/L   CO2 21 (L) 22 - 32 mmol/L   Glucose, Bld 71 65 - 99 mg/dL   BUN 8 6 - 20 mg/dL   Creatinine, Ser 9.56 0.44 - 1.00 mg/dL   Calcium 9.0 8.9 - 21.3 mg/dL   Total Protein 6.8 6.5 - 8.1 g/dL   Albumin 3.3 (L) 3.5 - 5.0 g/dL  AST 23 15 - 41 U/L   ALT 13 (L) 14 - 54 U/L   Alkaline Phosphatase 131 (H) 38 - 126 U/L   Total Bilirubin 0.6 0.3 - 1.2 mg/dL   GFR calc non Af Amer >60 >60 mL/min   GFR calc Af Amer >60 >60 mL/min   Anion gap 8 5 - 15  Type and screen [facility]     Status: None (Preliminary result)   Collection Time: 11/07/15  5:43 PM  Result Value Ref Range   ABO/RH(D) PENDING    Antibody Screen PENDING    Sample Expiration 11/10/2015   Protein / creatinine ratio, urine     Status: None   Collection Time: 11/07/15  5:44 PM  Result Value Ref Range   Creatinine, Urine 73 mg/dL   Total Protein, Urine 10 mg/dL   Protein Creatinine Ratio 0.14 0.00 - 0.15 mg/mg[Cre]  Urine Drug Screen, Qualitative ([facility] only)     Status: None   Collection Time: 11/07/15  5:44 PM  Result Value Ref Range   Tricyclic, Ur Screen NONE DETECTED NONE DETECTED   Amphetamines, Ur Screen NONE DETECTED NONE DETECTED   MDMA (Ecstasy)Ur Screen NONE DETECTED NONE DETECTED   Cocaine Metabolite,Ur [place] NONE DETECTED NONE DETECTED   Opiate, Ur Screen NONE DETECTED NONE DETECTED   Phencyclidine (PCP) Ur S NONE DETECTED NONE DETECTED   Cannabinoid 50 Ng, Ur [place] NONE DETECTED NONE DETECTED   Barbiturates, Ur Screen NONE DETECTED NONE DETECTED   Benzodiazepine, Ur Scrn NONE DETECTED NONE DETECTED   Methadone Scn, Ur NONE DETECTED NONE DETECTED    Assessment : Vashti HeyKimberly A Gross is a 20 y.o. G1P0 at 6794w6d being admitted for IOL for cHTN in  pregnancy, uncontrolled.   Plan: Labor:  Induction/Augmentation with Pitocin, per protocol.  Labetalol prn for HTN. PIH labs ordered, negative.  UDS ordered for h/o positive UDS in early pregnancy (Marijuana use). FWB: Reassuring fetal heart tracing.  GBS negative Delivery plan: Hopeful for vaginal delivery    Hildred LaserAnika Jalessa Peyser, MD [facility]

## 2015-11-07 NOTE — Progress Notes (Signed)
NONSTRESS TEST INTERPRETATION  INDICATIONS:  Chronic Hypertension  FHR baseline: 145 RESULTS:Reactive COMMENTS: Intermittent contractions   PLAN: 1. Continue fetal kick counts twice a day. 2. Continue antepartum testing as scheduled   Fenton Mallingebbie Amrit Cress, LPN

## 2015-11-07 NOTE — Progress Notes (Signed)
ROB: Patient c/o headache, blurred vision x 1 day.  Contractions x 2 days, worsening last night. BPs significantly elevated again today despite meds.  Will send to L&D for IOL as cervix is favorable, also r/o pre-eclampsia.  NST performed today was reviewed and was found to be reactive.

## 2015-11-08 ENCOUNTER — Encounter: Payer: Self-pay | Admitting: Anesthesiology

## 2015-11-08 ENCOUNTER — Inpatient Hospital Stay: Payer: BLUE CROSS/BLUE SHIELD | Admitting: Anesthesiology

## 2015-11-08 DIAGNOSIS — O10919 Unspecified pre-existing hypertension complicating pregnancy, unspecified trimester: Secondary | ICD-10-CM

## 2015-11-08 DIAGNOSIS — Z3403 Encounter for supervision of normal first pregnancy, third trimester: Secondary | ICD-10-CM

## 2015-11-08 LAB — TYPE AND SCREEN
ABO/RH(D): A NEG
Antibody Screen: NEGATIVE

## 2015-11-08 LAB — CHLAMYDIA/NGC RT PCR (ARMC ONLY)
CHLAMYDIA TR: NOT DETECTED
N GONORRHOEAE: NOT DETECTED

## 2015-11-08 LAB — RPR: RPR Ser Ql: NONREACTIVE

## 2015-11-08 MED ORDER — NALBUPHINE HCL 10 MG/ML IJ SOLN: 5.0000 mg | Freq: Once | INTRAMUSCULAR | Status: DC | PRN

## 2015-11-08 MED ORDER — ZOLPIDEM TARTRATE 5 MG PO TABS
5.0000 mg | ORAL_TABLET | Freq: Every evening | ORAL | Status: DC | PRN
Start: 2015-11-08 — End: 2015-11-10

## 2015-11-08 MED ORDER — MEPERIDINE HCL 25 MG/ML IJ SOLN: 6.2500 mg | INTRAMUSCULAR | Status: DC | PRN

## 2015-11-08 MED ORDER — ONDANSETRON HCL 4 MG/2ML IJ SOLN: 4.0000 mg | INTRAMUSCULAR | Status: DC | PRN

## 2015-11-08 MED ORDER — DIPHENHYDRAMINE HCL 25 MG PO CAPS: 25.0000 mg | ORAL_CAPSULE | ORAL | Status: DC | PRN

## 2015-11-08 MED ORDER — WITCH HAZEL-GLYCERIN EX PADS: 1.0000 "application " | MEDICATED_PAD | CUTANEOUS | Status: DC | PRN

## 2015-11-08 MED ORDER — NALBUPHINE HCL 10 MG/ML IJ SOLN: 5.0000 mg | INTRAMUSCULAR | Status: DC | PRN

## 2015-11-08 MED ORDER — LIDOCAINE HCL (PF) 1 % IJ SOLN
INTRAMUSCULAR | Status: DC | PRN
  Administered 2015-11-08: 3 mL via SUBCUTANEOUS

## 2015-11-08 MED ORDER — NALOXONE HCL 0.4 MG/ML IJ SOLN: 0.4000 mg | INTRAMUSCULAR | Status: DC | PRN

## 2015-11-08 MED ORDER — IBUPROFEN 600 MG PO TABS
600.0000 mg | ORAL_TABLET | Freq: Four times a day (QID) | ORAL | Status: DC
  Administered 2015-11-08 – 2015-11-10 (×6): 600 mg via ORAL
  Filled 2015-11-08 (×7): qty 1

## 2015-11-08 MED ORDER — ONDANSETRON HCL 4 MG/2ML IJ SOLN: 4.0000 mg | Freq: Three times a day (TID) | INTRAMUSCULAR | Status: DC | PRN

## 2015-11-08 MED ORDER — VARICELLA VIRUS VACCINE LIVE 1350 PFU/0.5ML ~~LOC~~ INJ
0.5000 mL | INJECTION | Freq: Once | SUBCUTANEOUS | Status: AC
Start: 2015-11-08 — End: 2015-11-10
  Administered 2015-11-10: 0.5 mL via SUBCUTANEOUS
  Filled 2015-11-08 (×2): qty 0.5

## 2015-11-08 MED ORDER — FENTANYL 2.5 MCG/ML W/ROPIVACAINE 0.2% IN NS 100 ML EPIDURAL INFUSION (ARMC-ANES)
10.0000 mL/h | EPIDURAL | Status: DC
  Administered 2015-11-08: 10 mL/h via EPIDURAL

## 2015-11-08 MED ORDER — DIPHENHYDRAMINE HCL 50 MG/ML IJ SOLN: 12.5000 mg | INTRAMUSCULAR | Status: DC | PRN

## 2015-11-08 MED ORDER — FENTANYL 2.5 MCG/ML W/ROPIVACAINE 0.2% IN NS 100 ML EPIDURAL INFUSION (ARMC-ANES)
EPIDURAL | Status: AC
  Administered 2015-11-08: 10 mL/h via EPIDURAL
  Filled 2015-11-08: qty 100

## 2015-11-08 MED ORDER — NALOXONE HCL 2 MG/2ML IJ SOSY
1.0000 ug/kg/h | PREFILLED_SYRINGE | INTRAVENOUS | Status: DC | PRN
  Filled 2015-11-08: qty 2

## 2015-11-08 MED ORDER — DIBUCAINE 1 % RE OINT: 1.0000 "application " | TOPICAL_OINTMENT | RECTAL | Status: DC | PRN

## 2015-11-08 MED ORDER — ACETAMINOPHEN 325 MG PO TABS: 650.0000 mg | ORAL_TABLET | ORAL | Status: DC | PRN

## 2015-11-08 MED ORDER — KETOROLAC TROMETHAMINE 30 MG/ML IJ SOLN: 30.0000 mg | Freq: Four times a day (QID) | INTRAMUSCULAR | Status: DC | PRN

## 2015-11-08 MED ORDER — LACTATED RINGERS IV SOLN: INTRAVENOUS | Status: DC

## 2015-11-08 MED ORDER — SODIUM CHLORIDE 0.9 % IV SOLN
INTRAVENOUS | Status: DC | PRN
  Administered 2015-11-08 (×2): 5 mL via EPIDURAL

## 2015-11-08 MED ORDER — SODIUM CHLORIDE 0.9% FLUSH: 3.0000 mL | INTRAVENOUS | Status: DC | PRN

## 2015-11-08 MED ORDER — SIMETHICONE 80 MG PO CHEW: 80.0000 mg | CHEWABLE_TABLET | ORAL | Status: DC | PRN

## 2015-11-08 MED ORDER — MEASLES, MUMPS & RUBELLA VAC ~~LOC~~ INJ
0.5000 mL | INJECTION | Freq: Once | SUBCUTANEOUS | Status: AC
  Administered 2015-11-10: 0.5 mL via SUBCUTANEOUS
  Filled 2015-11-08 (×2): qty 0.5

## 2015-11-08 MED ORDER — BENZOCAINE-MENTHOL 20-0.5 % EX AERO
1.0000 "application " | INHALATION_SPRAY | CUTANEOUS | Status: DC | PRN
  Administered 2015-11-08: 1 via TOPICAL
  Filled 2015-11-08 (×2): qty 56

## 2015-11-08 MED ORDER — DIPHENHYDRAMINE HCL 25 MG PO CAPS: 25.0000 mg | ORAL_CAPSULE | Freq: Four times a day (QID) | ORAL | Status: DC | PRN

## 2015-11-08 MED ORDER — PRENATAL MULTIVITAMIN CH
1.0000 | ORAL_TABLET | Freq: Every day | ORAL | Status: DC
  Administered 2015-11-08 – 2015-11-10 (×3): 1 via ORAL
  Filled 2015-11-08 (×3): qty 1

## 2015-11-08 MED ORDER — ONDANSETRON HCL 4 MG PO TABS: 4.0000 mg | ORAL_TABLET | ORAL | Status: DC | PRN

## 2015-11-08 MED ORDER — ACETAMINOPHEN-CODEINE #3 300-30 MG PO TABS: 1.0000 | ORAL_TABLET | ORAL | Status: DC | PRN

## 2015-11-08 MED ORDER — LIDOCAINE-EPINEPHRINE (PF) 1.5 %-1:200000 IJ SOLN
INTRAMUSCULAR | Status: DC | PRN
  Administered 2015-11-08: 3 mL via EPIDURAL

## 2015-11-08 MED ORDER — SENNOSIDES-DOCUSATE SODIUM 8.6-50 MG PO TABS
2.0000 | ORAL_TABLET | ORAL | Status: DC
  Administered 2015-11-09: 2 via ORAL
  Filled 2015-11-08: qty 2

## 2015-11-08 MED ORDER — COCONUT OIL OIL: 1.0000 "application " | TOPICAL_OIL | Status: DC | PRN

## 2015-11-08 NOTE — Anesthesia Preprocedure Evaluation (Signed)
Anesthesia Evaluation  Patient identified by MRN, date of birth, ID band Patient awake    Reviewed: Allergy & Precautions, H&P , NPO status , Patient's Chart, lab work & pertinent test results, reviewed documented beta blocker date and time   History of Anesthesia Complications Negative for: history of anesthetic complications  Airway Mallampati: I  TM Distance: >3 FB Neck ROM: full    Dental no notable dental hx. (+) Teeth Intact   Pulmonary neg pulmonary ROS,    Pulmonary exam normal breath sounds clear to auscultation       Cardiovascular Exercise Tolerance: Good hypertension, On Medications (-) angina(-) CAD, (-) Past MI, (-) Cardiac Stents and (-) CABG Normal cardiovascular exam(-) dysrhythmias (-) Valvular Problems/Murmurs Rhythm:regular Rate:Normal     Neuro/Psych negative neurological ROS  negative psych ROS   GI/Hepatic negative GI ROS, Neg liver ROS,   Endo/Other  neg diabetesMorbid obesity  Renal/GU negative Renal ROS  negative genitourinary   Musculoskeletal   Abdominal   Peds  Hematology  (+) Blood dyscrasia, anemia ,   Anesthesia Other Findings Past Medical History:   Grieving                                                     Hypertension                                                 Microalbuminuria                                             Benign headache                                              Migraine without aura and without status migra*              Dysmenorrhea                                                 Obesity                                                      Urticaria                                                    Marijuana use                                   2016  Comment:positive UDS   Reproductive/Obstetrics (+) Pregnancy                             Anesthesia Physical Anesthesia Plan  ASA: III  Anesthesia Plan:  Epidural   Post-op Pain Management:    Induction:   Airway Management Planned:   Additional Equipment:   Intra-op Plan:   Post-operative Plan:   Informed Consent: I have reviewed the patients History and Physical, chart, labs and discussed the procedure including the risks, benefits and alternatives for the proposed anesthesia with the patient or authorized representative who has indicated his/her understanding and acceptance.   Dental Advisory Given  Plan Discussed with: Anesthesiologist, CRNA and Surgeon  Anesthesia Plan Comments:         Anesthesia Quick Evaluation

## 2015-11-08 NOTE — Progress Notes (Signed)
Fhr showing spontaneous variable decel to 70's at  approx 0304 x 50-60 sec , onsetting as u/c ending.  Returning to baseline of 110's. Pt to L side at 0305. Fhr to 140's approx 0308.  Then vascillating between 70's -110's. Pt turned to R side, at present FHR to110s.

## 2015-11-08 NOTE — Progress Notes (Signed)
Intrapartum Progress Note  S: Patient denies complaints.  Has had epidural placed.   O:  Filed Vitals:   11/08/15 0259 11/08/15 0300 11/08/15 0330 11/08/15 0400  BP:   124/71 139/63  Pulse: 111  107 99  Temp:      TempSrc:      Resp: 20  18 20   Height:      Weight:      SpO2:  96%       Gen App: mild to moderate distress with contractions.  Abdomen: soft, gravid FHT: baseline 140 bpm.  Accels present.  Decels present - occasional shallow variable.. Moderate in degree variability.   Tocometer: contractions q 2-4 minutes Cervix: 9.5/100/c/0/AROM with scant blood tinged fluid.  Extremities: Nontender, no edema.  Pitocin:  Pitocin held for tachysystole.  Labs:  No new labs.   Assessment:  1: SIUP at 7882w0d 2. Chronic HTN in pregnancy  Plan:  1. Continue IOL with Pitocin once tachysystole resolved.  2. Patient has been given doses up to 80 mg of Labetalol, and then initiated on Hydralazine.  Was given PO dose of scheduled Aldomet.  Now after epidural BPs are wnl. Continue BP management per protocol. 3. Anticipate vaginal delivery soon.     Hildred LaserAnika Sofia Vanmeter, MD [facility] 11/08/2015 4:43 AM

## 2015-11-08 NOTE — Anesthesia Procedure Notes (Signed)
Epidural Patient location during procedure: OB Start time: 11/08/2015 1:36 AM End time: 11/08/2015 1:52 AM  Staffing Anesthesiologist: Lenard SimmerKARENZ, Daysy Santini Performed by: anesthesiologist   Preanesthetic Checklist Completed: patient identified, site marked, surgical consent, pre-op evaluation, timeout performed, IV checked, risks and benefits discussed and monitors and equipment checked  Epidural Patient position: sitting Prep: ChloraPrep Patient monitoring: heart rate, continuous pulse ox and blood pressure Approach: midline Location: L3-L4 Injection technique: LOR saline  Needle:  Needle type: Tuohy  Needle gauge: 17 G Needle length: 9 cm and 9 Needle insertion depth: 8 cm Catheter type: closed end flexible Catheter size: 19 Gauge Catheter at skin depth: 12 cm Test dose: negative and 1.5% lidocaine with Epi 1:200 K  Assessment Sensory level: T10 Events: blood not aspirated, injection not painful, no injection resistance, negative IV test and no paresthesia  Additional Notes Pt. Evaluated and documentation done after procedure finished. Patient identified. Risks/Benefits/Options discussed with patient including but not limited to bleeding, infection, nerve damage, paralysis, failed block, incomplete pain control, headache, blood pressure changes, nausea, vomiting, reactions to medication both or allergic, itching and postpartum back pain. Confirmed with bedside nurse the patient's most recent platelet count. Confirmed with patient that they are not currently taking any anticoagulation, have any bleeding history or any family history of bleeding disorders. Patient expressed understanding and wished to proceed. All questions were answered. Sterile technique was used throughout the entire procedure. Please see nursing notes for vital signs. Test dose was given through epidural catheter and negative prior to continuing to dose epidural or start infusion. Warning signs of high block given to  the patient including shortness of breath, tingling/numbness in hands, complete motor block, or any concerning symptoms with instructions to call for help. Patient was given instructions on fall risk and not to get out of bed. All questions and concerns addressed with instructions to call with any issues or inadequate analgesia.   Patient tolerated the insertion well without immediate complications.Reason for block:procedure for pain

## 2015-11-08 NOTE — Progress Notes (Signed)
RN and MD at the bedside pushing with patient.  Toco removed (not picking up contractions), due to patient position (lateral right side), toco not picking up contractions, RN directing patient on pushing when contraction palpated. At 0725 fetal HR 120s, RN continues to adjust U/S for fetal heart rate. At 0735 FSE placed due to difficulty monitoring fetus with U/S, fetal HR 115, maternal pulse in the 90s-100s.. At [phone] [id number], Kiwi attempt but would not keep suction (slippery head from  shampoo used to massage perineum - no suction, no pull).

## 2015-11-09 LAB — CBC
HEMATOCRIT: 29.2 % — AB (ref 35.0–47.0)
Hemoglobin: 10.1 g/dL — ABNORMAL LOW (ref 12.0–16.0)
MCH: 36.9 pg — AB (ref 26.0–34.0)
MCHC: 34.7 g/dL (ref 32.0–36.0)
MCV: 106.6 fL — AB (ref 80.0–100.0)
Platelets: 156 10*3/uL (ref 150–440)
RBC: 2.74 MIL/uL — ABNORMAL LOW (ref 3.80–5.20)
RDW: 14.6 % — AB (ref 11.5–14.5)
WBC: 10.7 10*3/uL (ref 3.6–11.0)

## 2015-11-09 MED ORDER — NADOLOL 40 MG PO TABS
40.0000 mg | ORAL_TABLET | Freq: Every day | ORAL | Status: DC
  Administered 2015-11-09 – 2015-11-10 (×2): 40 mg via ORAL
  Filled 2015-11-09 (×2): qty 1

## 2015-11-09 NOTE — Anesthesia Postprocedure Evaluation (Signed)
Anesthesia Post Note  Patient: Lori Gross  Procedure(s) Performed: * No procedures listed *  Patient location during evaluation: Other Anesthesia Type: General Level of consciousness: awake and alert Pain management: pain level controlled Vital Signs Assessment: post-procedure vital signs reviewed and stable Respiratory status: spontaneous breathing, nonlabored ventilation, respiratory function stable and patient connected to nasal cannula oxygen Cardiovascular status: blood pressure returned to baseline and stable Postop Assessment: no signs of nausea or vomiting Anesthetic complications: no    Last Vitals:  Filed Vitals:   11/09/15 1500 11/09/15 1806  BP: 153/100 145/83  Pulse: 70 72  Temp:  36.5 C  Resp:      Last Pain:  Filed Vitals:   11/09/15 1807  PainSc: 0-No pain                 [name] S

## 2015-11-09 NOTE — Progress Notes (Addendum)
Post Partum Day # 1, s/p SVD  Subjective: no complaints, up ad lib, voiding and tolerating PO  Objective: Temp:  [98 F (36.7 C)-98.2 F (36.8 C)] 98.2 F (36.8 C) (07/22 0829) Pulse Rate:  [80-120] 92 (07/22 0829) Resp:  [18-20] 18 (07/22 0829) BP: (142-157)/(83-96) 157/94 mmHg (07/22 0829) SpO2:  [98 %-100 %] 100 % (07/22 0829)  Physical Exam:  General: alert and no distress  Lungs: clear to auscultation bilaterally Breasts: normal appearance, no masses or tenderness Heart: regular rate and rhythm, S1, S2 normal, no murmur, click, rub or gallop Pelvis: Lochia: appropriate, Uterine Fundus: firm Extremities: DVT Evaluation: No cords or calf tenderness. No significant calf/ankle edema. Positive Homan's sign.   Recent Labs  11/07/15 1743 11/09/15 0653  HGB 12.2 10.1*  HCT 34.7* 29.2*    Assessment/Plan: Plan for discharge tomorrow and Contraception OCPs  Bottle feeding.  Discussed benefits of breastfeeding Chronic HTN, BPs initally wnl, now persistently elevated (although less than 160s/100s).  Was on Nadolol 40 mg prior to pregnancy. Will restart today.  Mild postpartum anemia, asymptomatic.  Will treat with PO daily iron (in PNV).     LOS: 2 days     Lori Laser, MD [facility]

## 2015-11-10 MED ORDER — IBUPROFEN 600 MG PO TABS: 600.0000 mg | ORAL_TABLET | Freq: Three times a day (TID) | ORAL | 1 refills | Status: DC | PRN

## 2015-11-10 MED ORDER — DOCUSATE SODIUM 100 MG PO CAPS: 100.0000 mg | ORAL_CAPSULE | Freq: Two times a day (BID) | ORAL | 2 refills | Status: DC | PRN

## 2015-11-10 MED ORDER — NADOLOL 40 MG PO TABS: 40.0000 mg | ORAL_TABLET | Freq: Every day | ORAL | 6 refills | Status: DC

## 2015-11-10 NOTE — Discharge Summary (Signed)
Obstetric Discharge Summary Reason for Admission: induction of labor and cHTN in pregnancy Prenatal Procedures: NST and ultrasound Intrapartum Procedures: spontaneous vaginal delivery Postpartum Procedures: Rubella Ig and Varicella Ig Complications-Operative and Postpartum: 1st degree vaginal laceration Hemoglobin  Date Value Ref Range Status  11/09/2015 10.1 (L) 12.0 - 16.0 g/dL Final   HCT  Date Value Ref Range Status  11/09/2015 29.2 (L) 35.0 - 47.0 % Final   Hematocrit  Date Value Ref Range Status  10/17/2015 34.3 34.0 - 46.6 % Final    Physical Exam:  General: alert and no distress Lochia: appropriate Uterine Fundus: firm Incision: None DVT Evaluation: Negative Homan's sign.  No cords or calf tenderness. No significant calf/ankle edema.  Discharge Diagnoses: Term Pregnancy-delivered and Chronic HTN in pregnancy  Discharge Information: Date: 11/10/2015 Activity: pelvic rest Diet: routine Medications: PNV, Ibuprofen, Colace and Nadolol Condition: stable Instructions: refer to practice specific booklet Discharge to: home Follow-up Information    [name], MD Follow up in 1 week(s).   Specialties:  Obstetrics and Gynecology, Radiology Why:  1 week for BP check.  Follow up in 6 weeks for postpartum visit Contact information: [address] [place] [address] [phone]           Newborn Data: Live born female  Birth Weight: 6 lb 13 oz (3090 g) APGAR: 7, 9  Home with mother.  [name] 11/10/2015, 12:35 PM

## 2015-11-10 NOTE — Progress Notes (Signed)
Pt discharged home with infant.  Discharge instructions and follow up appointment given to and reviewed with pt.  Pt verbalized understanding.  Escorted by auxillary. 

## 2015-11-10 NOTE — Progress Notes (Signed)
Post Partum Day # 2, s/p SVD  Subjective: no complaints, up ad lib, voiding and tolerating PO  Objective: Vitals:   11/09/15 2052 11/09/15 2329 11/10/15 0429 11/10/15 0813  BP: (!) 153/90 126/75 129/80 132/77  Pulse: 95 90 71 64  Resp: 20 20 20 16   Temp: 99 F (37.2 C) 98.3 F (36.8 C) 98.1 F (36.7 C) 98.1 F (36.7 C)  TempSrc: Oral Oral Oral Oral  SpO2:  98%    Weight:      Height:        Physical Exam:  General: alert and no distress  Lungs: clear to auscultation bilaterally Breasts: normal appearance, no masses or tenderness Heart: regular rate and rhythm, S1, S2 normal, no murmur, click, rub or gallop Pelvis: Lochia: appropriate, Uterine Fundus: firm Extremities: DVT Evaluation: No cords or calf tenderness. No significant calf/ankle edema. Positive Homan's sign.   Recent Labs  11/07/15 1743 11/09/15 0653  HGB 12.2 10.1*  HCT 34.7* 29.2*    Assessment/Plan: Discharge home and Contraception OCPs  Bottle feeding.  Discussed benefits of breastfeeding Chronic HTN, better controlled after resumption of home med. Continue on Nadolol 40 mg daily  To f/u in 1 week for BP check to ensure no other medications need to be added.  Mild postpartum anemia, asymptomatic.  Will treat with PO daily iron (in PNV).     LOS: 3 days     [name], MD [facility]

## 2015-11-10 NOTE — Discharge Instructions (Signed)
General Postpartum Discharge Instructions  Do not drink alcohol or take tranquilizers.  Do not take medicine that has not been prescribed by your doctor.  Take showers instead of baths until your doctor gives you permission to take baths.  No sexual intercourse or placement of anything in the vagina for 6 weeks or as instructed by your doctor. Only take prescription or over-the-counter medicines  for pain, discomfort, or fever as directed by your doctor. Take medicines (antibiotics) that kill germs if they are prescribed for you.   Call the office or go to the Emergency Room if:  You feel sick to your stomach (nauseous).  You start to throw up (vomit).  You have trouble eating or drinking.  You have an oral temperature above 101.  You have constipation that is not helped by adjusting diet or increasing fluid intake. Pain medicines are a common cause of constipation.  You have foul smelling vaginal discharge or odor.  You have bleeding requiring changing more than 1 pad per hour. You have any other concerns.  SEEK IMMEDIATE MEDICAL CARE IF:  You have persistent dizziness.  You have difficulty breathing or shortness of breath.  You have an oral temperature above 102.5, not controlled by medicine.       Postpartum Depression and Baby Blues The postpartum period begins right after the birth of a baby. During this time, there is often a great amount of joy and excitement. It is also a time of many changes in the life of the parents. Regardless of how many times a mother gives birth, each child brings new challenges and dynamics to the family. It is not unusual to have feelings of excitement along with confusing shifts in moods, emotions, and thoughts. All mothers are at risk of developing postpartum depression or the "baby blues." These mood changes can occur right after giving birth, or they may occur many months after giving birth. The baby blues or postpartum depression can be mild or  severe. Additionally, postpartum depression can go away rather quickly, or it can be a long-term condition.  CAUSES Raised hormone levels and the rapid drop in those levels are thought to be a main cause of postpartum depression and the baby blues. A number of hormones change during and after pregnancy. Estrogen and progesterone usually decrease right after the delivery of your baby. The levels of thyroid hormone and various cortisol steroids also rapidly drop. Other factors that play a role in these mood changes include major life events and genetics.  RISK FACTORS If you have any of the following risks for the baby blues or postpartum depression, know what symptoms to watch out for during the postpartum period. Risk factors that may increase the likelihood of getting the baby blues or postpartum depression include:  Having a personal or family history of depression.   Having depression while being pregnant.   Having premenstrual mood issues or mood issues related to oral contraceptives.  Having a lot of life stress.   Having marital conflict.   Lacking a social support network.   Having a baby with special needs.   Having health problems, such as diabetes.  SIGNS AND SYMPTOMS Symptoms of baby blues include:  Brief changes in mood, such as going from extreme happiness to sadness.  Decreased concentration.   Difficulty sleeping.   Crying spells, tearfulness.   Irritability.   Anxiety.  Symptoms of postpartum depression typically begin within the first month after giving birth. These symptoms include:  Difficulty sleeping  or excessive sleepiness.   Marked weight loss.   Agitation.   Feelings of worthlessness.   Lack of interest in activity or food.  Postpartum psychosis is a very serious condition and can be dangerous. Fortunately, it is rare. Displaying any of the following symptoms is cause for immediate medical attention. Symptoms of postpartum  psychosis include:   Hallucinations and delusions.   Bizarre or disorganized behavior.   Confusion or disorientation.  DIAGNOSIS  A diagnosis is made by an evaluation of your symptoms. There are no medical or lab tests that lead to a diagnosis, but there are various questionnaires that a health care provider may use to identify those with the baby blues, postpartum depression, or psychosis. Often, a screening tool called the [place] Postnatal Depression Scale is used to diagnose depression in the postpartum period.  TREATMENT The baby blues usually goes away on its own in 1-2 weeks. Social support is often all that is needed. You will be encouraged to get adequate sleep and rest. Occasionally, you may be given medicines to help you sleep.  Postpartum depression requires treatment because it can last several months or longer if it is not treated. Treatment may include individual or group therapy, medicine, or both to address any social, physiological, and psychological factors that may play a role in the depression. Regular exercise, a healthy diet, rest, and social support may also be strongly recommended.  Postpartum psychosis is more serious and needs treatment right away. Hospitalization is often needed. HOME CARE INSTRUCTIONS  Get as much rest as you can. Nap when the baby sleeps.   Exercise regularly. Some women find yoga and walking to be beneficial.   Eat a balanced and nourishing diet.   Do little things that you enjoy. Have a cup of tea, take a bubble bath, read your favorite magazine, or listen to your favorite music.  Avoid alcohol.   Ask for help with household chores, cooking, grocery shopping, or running errands as needed. Do not try to do everything.   Talk to people close to you about how you are feeling. Get support from your partner, family members, friends, or other new moms.  Try to stay positive in how you think. Think about the things you are grateful for.    Do not spend a lot of time alone.   Only take over-the-counter or prescription medicine as directed by your health care provider.  Keep all your postpartum appointments.   Let your health care provider know if you have any concerns.  SEEK MEDICAL CARE IF: You are having a reaction to or problems with your medicine. SEEK IMMEDIATE MEDICAL CARE IF:  You have suicidal feelings.   You think you may harm the baby or someone else. MAKE SURE YOU:  Understand these instructions.  Will watch your condition.  Will get help right away if you are not doing well or get worse.   This information is not intended to replace advice given to you by your health care provider. Make sure you discuss any questions you have with your health care provider.   Document Released: 01/09/2004 Document Revised: 04/11/2013 Document Reviewed: 01/16/2013 [employer] Interactive Patient Education [employer]

## 2015-11-15 ENCOUNTER — Ambulatory Visit (INDEPENDENT_AMBULATORY_CARE_PROVIDER_SITE_OTHER): Payer: BLUE CROSS/BLUE SHIELD | Admitting: Obstetrics and Gynecology

## 2015-11-15 VITALS — BP 135/93 | HR 68 | Wt 222.0 lb

## 2015-11-15 DIAGNOSIS — O10919 Unspecified pre-existing hypertension complicating pregnancy, unspecified trimester: Secondary | ICD-10-CM

## 2015-11-15 NOTE — Progress Notes (Signed)
Patient ID: Lori Gross, female   DOB: June 18, 1995, 20 y.o.   MRN: [id number] Pt presents for B/P check for chronic hypertension, post partum 6 days. Pt. Doing well. C/O light headache at times otherwise no dizziness, visual changes, n/v. B/P 135/93  P 68 today.

## 2015-11-20 ENCOUNTER — Ambulatory Visit (INDEPENDENT_AMBULATORY_CARE_PROVIDER_SITE_OTHER): Payer: BLUE CROSS/BLUE SHIELD | Admitting: Family Medicine

## 2015-11-20 ENCOUNTER — Encounter: Payer: Self-pay | Admitting: Family Medicine

## 2015-11-20 VITALS — BP 146/94 | HR 84 | Temp 98.3°F | Resp 18 | Ht 64.0 in | Wt 219.3 lb

## 2015-11-20 DIAGNOSIS — I10 Essential (primary) hypertension: Secondary | ICD-10-CM | POA: Insufficient documentation

## 2015-11-20 DIAGNOSIS — R809 Proteinuria, unspecified: Secondary | ICD-10-CM | POA: Diagnosis not present

## 2015-11-20 MED ORDER — HYDROCHLOROTHIAZIDE 12.5 MG PO TABS
12.5000 mg | ORAL_TABLET | Freq: Every day | ORAL | 0 refills | Status: DC
Start: 2015-11-20 — End: 2015-11-20

## 2015-11-20 MED ORDER — HYDROCHLOROTHIAZIDE 12.5 MG PO TABS: 12.5000 mg | ORAL_TABLET | Freq: Every day | ORAL | 0 refills | Status: DC

## 2015-11-20 NOTE — Progress Notes (Signed)
Name: Lori Gross   MRN: [id number]    DOB: 1995/11/20   Date:11/20/2015       Progress Note  Subjective  Chief Complaint  Chief Complaint  Patient presents with  . Follow-up  . Hypertension    Patient has had headaches and stayed on BP medications throughout her pregnancy.  . Contraception    Patient just had her daughter on November 08, 2015 and would like to discuss birth control options    HPI  HTN: she is on Corgard 40 mg daily , bp remained elevated during pregnancy. She also had proteinuria and was induced at [redacted] weeks GA . Daughter was 6 lbs 13 ounces. Bottle fed. She is taking bp medication but bp is still elevated. She plans on getting pregnant again in about one year. She denies headaches, edema, chest pain or SOB. We will continue Corgard and add HCTZ. It would be nice to give her ACE/ARB but we will wait until she finishes having children.   Contraception: she has migraines but no aura. Discussed options including Depo, or oral birth control. Importance of waiting until 6 weeks post-partum to have intercourse and to discuss it with GYN  Patient Active Problem List   Diagnosis Date Noted  . Hypertension, benign 11/20/2015  . Chronic hypertension complicating or reason for care during pregnancy 11/07/2015  . Abdominal pain during pregnancy 11/07/2015  . Rh negative state in antepartum period 08/27/2015  . Supervision of high risk pregnancy in third trimester 06/06/2015  . Rubella non-immune status, antepartum 04/23/2015  . Anemia of pregnancy in first trimester 04/23/2015  . Maternal varicella, non-immune 03/26/2015  . Positive urine drug screen 03/25/2015  . Dysmenorrhea 10/08/2014  . 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 Microalbuminuria 10/08/2014  . Obesity (BMI 30.0-34.9) 10/08/2014  . Migraine without aura and responsive to treatment 10/08/2014    History reviewed. No pertinent surgical history.  Family History  Problem Relation Age of Onset  . Hypertension  Mother   . Cancer Mother 11    Common Bile Duct  . Hypertension Sister   . Hypertension Brother   . Diabetes Paternal Grandmother   . Cancer Paternal Grandmother 4    brain cancer  . Diabetes Paternal Grandfather   . Hypertension Father   . Cancer Father 28    kidney cancer    Social History   Social History  . Marital status: Single    Spouse name: N/A  . Number of children: N/A  . Years of education: N/A   Occupational History  . Not on file.   Social History Main Topics  . Smoking status: Never Smoker  . Smokeless tobacco: Never Used  . Alcohol use No  . Drug use: No  . Sexual activity: Yes    Partners: Male    Birth control/ protection: None   Other Topics Concern  . Not on file   Social History Narrative   Dating for 4 years Rolan Bucco ) had a baby girl - Primitivo Gauze on July 21st, 2017 and the are now living together     Current Outpatient Prescriptions:  .  nadolol (CORGARD) 40 MG tablet, Take 1 tablet (40 mg total) by mouth daily., Disp: 30 tablet, Rfl: 6 .  hydrochlorothiazide (HYDRODIURIL) 12.5 MG tablet, Take 1 tablet (12.5 mg total) by mouth daily., Disp: 30 tablet, Rfl: 0  No Known Allergies   ROS  Ten systems reviewed and is negative except as mentioned in HPI   Objective  Vitals:  11/20/15 1000  BP: (!) 146/94  Pulse: 84  Resp: 18  Temp: 98.3 F (36.8 C)  TempSrc: Oral  SpO2: 98%  Weight: 219 lb 4.8 oz (99.5 kg)  Height: '5\' 4"'  (1.626 m)    Body mass index is 37.64 kg/m.  Physical Exam  Constitutional: Patient appears well-developed and well-nourished. Obese No distress.  HEENT: head atraumatic, normocephalic, pupils equal and reactive to light, neck supple, throat within normal limits Cardiovascular: Normal rate, regular rhythm and normal heart sounds.  No murmur heard. No BLE edema. Pulmonary/Chest: Effort normal and breath sounds normal. No respiratory distress. Abdominal: Soft.  There is no tenderness. Psychiatric: Patient has  a normal mood and affect. behavior is normal. Judgment and thought content normal.  Recent Results (from the past 2160 hour(s))  Hemoglobin and hematocrit, blood     Status: Abnormal   Collection Time: 08/27/15  8:30 AM  Result Value Ref Range   Hemoglobin 10.6 (L) 11.1 - 15.9 g/dL   Hematocrit 30.5 (L) 34.0 - 46.6 %  Glucose, 1 hour gestational     Status: None   Collection Time: 08/27/15  8:30 AM  Result Value Ref Range   Gestational Diabetes Screen 113 65 - 139 mg/dL    Comment: According to ADA, a glucose threshold of >139 mg/dL after 50-gram load identifies approximately 80% of women with gestational diabetes mellitus, while the sensitivity is further increased to approximately 90% by a threshold of >129 mg/dL.   POCT urinalysis dipstick     Status: Normal   Collection Time: 08/27/15  8:51 AM  Result Value Ref Range   Color, UA yellow    Clarity, UA clear    Glucose, UA neg    Bilirubin, UA neg    Ketones, UA neg    Spec Grav, UA 1.020    Blood, UA neg    pH, UA 6.5    Protein, UA neg    Urobilinogen, UA negative    Nitrite, UA neg    Leukocytes, UA Negative Negative  POCT urinalysis dipstick     Status: Normal   Collection Time: 09/18/15 12:17 PM  Result Value Ref Range   Color, UA pale yellow    Clarity, UA clear    Glucose, UA neg    Bilirubin, UA neg    Ketones, UA neg    Spec Grav, UA <=1.005    Blood, UA neg    pH, UA 8.0    Protein, UA neg    Urobilinogen, UA negative    Nitrite, UA neg    Leukocytes, UA Negative Negative  POCT urinalysis dipstick     Status: Abnormal   Collection Time: 09/20/15  9:12 AM  Result Value Ref Range   Color, UA yellow    Clarity, UA clear    Glucose, UA neg    Bilirubin, UA neg    Ketones, UA neg    Spec Grav, UA >=1.030    Blood, UA neg    pH, UA 6.0    Protein, UA neg    Urobilinogen, UA 0.2    Nitrite, UA neg    Leukocytes, UA Trace (A) Negative  POCT urinalysis dipstick     Status: None   Collection Time:  09/23/15  9:02 AM  Result Value Ref Range   Color, UA clear    Clarity, UA yellow    Glucose, UA negative    Bilirubin, UA negative    Ketones, UA negative    Spec Grav, UA  1.020    Blood, UA negative    pH, UA 6.0    Protein, UA negative    Urobilinogen, UA negative    Nitrite, UA negative    Leukocytes, UA Negative Negative  POCT urinalysis dipstick     Status: Abnormal   Collection Time: 10/03/15  8:45 AM  Result Value Ref Range   Color, UA yellow    Clarity, UA clear    Glucose, UA neg    Bilirubin, UA neg    Ketones, UA neg    Spec Grav, UA >=1.030    Blood, UA neg    pH, UA 6.0    Protein, UA neg    Urobilinogen, UA negative    Nitrite, UA neg    Leukocytes, UA Trace (A) Negative  POCT urinalysis dipstick     Status: None   Collection Time: 10/07/15 10:32 AM  Result Value Ref Range   Color, UA YELLOW    Clarity, UA CLOUDY    Glucose, UA NEGATIVE    Bilirubin, UA NEGATIVE    Ketones, UA NEGATIVE    Spec Grav, UA 1.025    Blood, UA NEGATIVE    pH, UA 6.0    Protein, UA NEGATIVE    Urobilinogen, UA negative    Nitrite, UA NEGATIVE    Leukocytes, UA Negative Negative  POCT urinalysis dipstick     Status: None   Collection Time: 10/14/15  8:30 AM  Result Value Ref Range   Color, UA yellow    Clarity, UA cloudy    Glucose, UA negative    Bilirubin, UA negative    Ketones, UA negative    Spec Grav, UA 1.025    Blood, UA negative    pH, UA 6.0    Protein, UA negative    Urobilinogen, UA negative    Nitrite, UA negative    Leukocytes, UA Negative Negative  POCT urinalysis dipstick     Status: Abnormal   Collection Time: 10/17/15  9:03 AM  Result Value Ref Range   Color, UA YELLOW    Clarity, UA CLEAR    Glucose, UA NEGATIVE    Bilirubin, UA NEGATIVE    Ketones, UA NEGATIVE    Spec Grav, UA 1.025    Blood, UA NEGATIVE    pH, UA 6.5    Protein, UA NEGATIVE    Urobilinogen, UA negative    Nitrite, UA NEGATIVE    Leukocytes, UA small (1+) (A) Negative   Culture, beta strep (group b only)     Status: None   Collection Time: 10/17/15 11:00 AM  Result Value Ref Range   Strep Gp B Culture Negative Negative    Comment: Centers for Disease Control and Prevention (CDC) and American Congress of Obstetricians and Gynecologists (ACOG) guidelines for prevention of perinatal group B streptococcal (GBS) disease specify co-collection of a vaginal and rectal swab specimen to maximize sensitivity of GBS detection. Per the CDC and ACOG, swabbing both the lower vagina and rectum substantially increases the yield of detection compared with sampling the vagina alone. Penicillin G, ampicillin, or cefazolin are indicated for intrapartum prophylaxis of perinatal GBS colonization. Reflex susceptibility testing should be performed prior to use of clindamycin only on GBS isolates from penicillin-allergic women who are considered a high risk for anaphylaxis. Treatment with vancomycin without additional testing is warranted if resistance to clindamycin is noted.   GC/Chlamydia Probe Amp     Status: None   Collection Time: 10/17/15 11:00 AM  Result  Value Ref Range   Chlamydia trachomatis, NAA Negative Negative   Neisseria gonorrhoeae by PCR Negative Negative  Hemoglobin and hematocrit, blood     Status: None   Collection Time: 10/17/15 11:00 AM  Result Value Ref Range   Hemoglobin 11.9 11.1 - 15.9 g/dL   Hematocrit 34.3 34.0 - 46.6 %  POCT urinalysis dipstick     Status: None   Collection Time: 10/24/15  3:19 PM  Result Value Ref Range   Color, UA yellow    Clarity, UA clear    Glucose, UA negative    Bilirubin, UA negative    Ketones, UA negative    Spec Grav, UA 1.015    Blood, UA non-hemolyzed trace    pH, UA 6.0    Protein, UA trace    Urobilinogen, UA negative    Nitrite, UA negative    Leukocytes, UA Negative Negative  POCT urinalysis dipstick     Status: None   Collection Time: 10/28/15  5:14 PM  Result Value Ref Range   Color, UA YELLOW     Clarity, UA CLOUDY    Glucose, UA NEGATIVE    Bilirubin, UA NEGATIVE    Ketones, UA NEGATIVE    Spec Grav, UA 1.020    Blood, UA NEGATIVE    pH, UA 6.0    Protein, UA TRACE    Urobilinogen, UA 0.2    Nitrite, UA NEGATIVE    Leukocytes, UA Negative Negative  POCT urinalysis dipstick     Status: Abnormal   Collection Time: 10/31/15 10:04 AM  Result Value Ref Range   Color, UA amber    Clarity, UA clear    Glucose, UA neg    Bilirubin, UA neg    Ketones, UA neg    Spec Grav, UA >=1.030    Blood, UA neg    pH, UA 6.0    Protein, UA 1+    Urobilinogen, UA >=8.0    Nitrite, UA neg    Leukocytes, UA Negative Negative  Protein / creatinine ratio, urine     Status: None   Collection Time: 10/31/15 12:20 PM  Result Value Ref Range   Creatinine, Urine 224 mg/dL   Total Protein, Urine 15 mg/dL    Comment: NO NORMAL RANGE ESTABLISHED FOR THIS TEST   Protein Creatinine Ratio 0.07 0.00 - 0.15 mg/mg[Cre]  CBC with Differential/Platelet     Status: Abnormal   Collection Time: 10/31/15 12:35 PM  Result Value Ref Range   WBC 10.3 3.6 - 11.0 K/uL   RBC 3.30 (L) 3.80 - 5.20 MIL/uL   Hemoglobin 12.0 12.0 - 16.0 g/dL   HCT 34.7 (L) 35.0 - 47.0 %   MCV 105.1 (H) 80.0 - 100.0 fL   MCH 36.3 (H) 26.0 - 34.0 pg   MCHC 34.5 32.0 - 36.0 g/dL   RDW 14.5 11.5 - 14.5 %   Platelets 200 150 - 440 K/uL   Neutrophils Relative % 83 %   Neutro Abs 8.5 (H) 1.4 - 6.5 K/uL   Lymphocytes Relative 12 %   Lymphs Abs 1.2 1.0 - 3.6 K/uL   Monocytes Relative 5 %   Monocytes Absolute 0.5 0.2 - 0.9 K/uL   Eosinophils Relative 0 %   Eosinophils Absolute 0.0 0 - 0.7 K/uL   Basophils Relative 0 %   Basophils Absolute 0.0 0 - 0.1 K/uL  Comprehensive metabolic panel     Status: Abnormal   Collection Time: 10/31/15 12:35 PM  Result Value Ref  Range   Sodium 136 135 - 145 mmol/L   Potassium 3.9 3.5 - 5.1 mmol/L   Chloride 108 101 - 111 mmol/L   CO2 19 (L) 22 - 32 mmol/L   Glucose, Bld 103 (H) 65 - 99 mg/dL    BUN 8 6 - 20 mg/dL   Creatinine, Ser 0.54 0.44 - 1.00 mg/dL   Calcium 9.2 8.9 - 10.3 mg/dL   Total Protein 6.4 (L) 6.5 - 8.1 g/dL   Albumin 3.1 (L) 3.5 - 5.0 g/dL   AST 20 15 - 41 U/L   ALT 13 (L) 14 - 54 U/L   Alkaline Phosphatase 119 38 - 126 U/L   Total Bilirubin 0.4 0.3 - 1.2 mg/dL   GFR calc non Af Amer >60 >60 mL/min   GFR calc Af Amer >60 >60 mL/min    Comment: (NOTE) The eGFR has been calculated using the CKD EPI equation. This calculation has not been validated in all clinical situations. eGFR's persistently <60 mL/min signify possible Chronic Kidney Disease.    Anion gap 9 5 - 15  POCT urinalysis dipstick     Status: None   Collection Time: 11/04/15  9:26 AM  Result Value Ref Range   Color, UA yellow    Clarity, UA cleaqr    Glucose, UA negative    Bilirubin, UA negative    Ketones, UA negative    Spec Grav, UA 1.025    Blood, UA negative    pH, UA 6.5    Protein, UA negative    Urobilinogen, UA 0.2    Nitrite, UA negative    Leukocytes, UA Negative Negative  POCT urinalysis dipstick     Status: Abnormal   Collection Time: 11/07/15  2:31 PM  Result Value Ref Range   Color, UA yellow    Clarity, UA cloudy    Glucose, UA negative    Bilirubin, UA negative    Ketones, UA negative    Spec Grav, UA 1.025    Blood, UA large    pH, UA 6.0    Protein, UA trace    Urobilinogen, UA negative    Nitrite, UA negative    Leukocytes, UA Trace (A) Negative  CBC     Status: Abnormal   Collection Time: 11/07/15  5:43 PM  Result Value Ref Range   WBC 10.9 3.6 - 11.0 K/uL   RBC 3.29 (L) 3.80 - 5.20 MIL/uL   Hemoglobin 12.2 12.0 - 16.0 g/dL   HCT 34.7 (L) 35.0 - 47.0 %   MCV 105.3 (H) 80.0 - 100.0 fL   MCH 37.0 (H) 26.0 - 34.0 pg   MCHC 35.2 32.0 - 36.0 g/dL   RDW 14.6 (H) 11.5 - 14.5 %   Platelets 214 150 - 440 K/uL  RPR     Status: None   Collection Time: 11/07/15  5:43 PM  Result Value Ref Range   RPR Ser Ql Non Reactive Non Reactive    Comment:  (NOTE) Performed At: [facility] [place], [place] [id number] Lindon Romp MD [id number]   Comprehensive metabolic panel     Status: Abnormal   Collection Time: 11/07/15  5:43 PM  Result Value Ref Range   Sodium 135 135 - 145 mmol/L   Potassium 4.1 3.5 - 5.1 mmol/L   Chloride 106 101 - 111 mmol/L   CO2 21 (L) 22 - 32 mmol/L   Glucose, Bld 71 65 - 99 mg/dL   BUN 8 6 -  20 mg/dL   Creatinine, Ser 0.52 0.44 - 1.00 mg/dL   Calcium 9.0 8.9 - 10.3 mg/dL   Total Protein 6.8 6.5 - 8.1 g/dL   Albumin 3.3 (L) 3.5 - 5.0 g/dL   AST 23 15 - 41 U/L   ALT 13 (L) 14 - 54 U/L   Alkaline Phosphatase 131 (H) 38 - 126 U/L   Total Bilirubin 0.6 0.3 - 1.2 mg/dL   GFR calc non Af Amer >60 >60 mL/min   GFR calc Af Amer >60 >60 mL/min    Comment: (NOTE) The eGFR has been calculated using the CKD EPI equation. This calculation has not been validated in all clinical situations. eGFR's persistently <60 mL/min signify possible Chronic Kidney Disease.    Anion gap 8 5 - 15  Type and screen [facility]     Status: None   Collection Time: 11/07/15  5:43 PM  Result Value Ref Range   ABO/RH(D) A NEG    Antibody Screen NEG    Sample Expiration 11/10/2015   Protein / creatinine ratio, urine     Status: None   Collection Time: 11/07/15  5:44 PM  Result Value Ref Range   Creatinine, Urine 73 mg/dL   Total Protein, Urine 10 mg/dL    Comment: NO NORMAL RANGE ESTABLISHED FOR THIS TEST   Protein Creatinine Ratio 0.14 0.00 - 0.15 mg/mg[Cre]  Urine Drug Screen, Qualitative ([facility] only)     Status: None   Collection Time: 11/07/15  5:44 PM  Result Value Ref Range   Tricyclic, Ur Screen NONE DETECTED NONE DETECTED   Amphetamines, Ur Screen NONE DETECTED NONE DETECTED   MDMA (Ecstasy)Ur Screen NONE DETECTED NONE DETECTED   Cocaine Metabolite,Ur [place] NONE DETECTED NONE DETECTED   Opiate, Ur Screen NONE DETECTED NONE DETECTED   Phencyclidine (PCP) Ur S NONE DETECTED  NONE DETECTED   Cannabinoid 50 Ng, Ur [place] NONE DETECTED NONE DETECTED   Barbiturates, Ur Screen NONE DETECTED NONE DETECTED   Benzodiazepine, Ur Scrn NONE DETECTED NONE DETECTED   Methadone Scn, Ur NONE DETECTED NONE DETECTED    Comment: (NOTE) 417  Tricyclics, urine               Cutoff 1000 ng/mL 200  Amphetamines, urine             Cutoff 1000 ng/mL 300  MDMA (Ecstasy), urine           Cutoff 500 ng/mL 400  Cocaine Metabolite, urine       Cutoff 300 ng/mL 500  Opiate, urine                   Cutoff 300 ng/mL 600  Phencyclidine (PCP), urine      Cutoff 25 ng/mL 700  Cannabinoid, urine              Cutoff 50 ng/mL 800  Barbiturates, urine             Cutoff 200 ng/mL 900  Benzodiazepine, urine           Cutoff 200 ng/mL 1000 Methadone, urine                Cutoff 300 ng/mL 1100 1200 The urine drug screen provides only a preliminary, unconfirmed 1300 analytical test result and should not be used for non-medical 1400 purposes. Clinical consideration and professional judgment should 1500 be applied to any positive drug screen result due to possible 1600 interfering substances. A more specific alternate  chemical method 1700 must be used in order to obtain a confirmed analytical result.  1800 Gas chromato graphy / mass spectrometry (GC/MS) is the preferred 1900 confirmatory method.   Chlamydia/NGC rt PCR ([facility] only)     Status: None   Collection Time: 11/07/15  5:44 PM  Result Value Ref Range   Specimen source GC/Chlam URINE, RANDOM    Chlamydia Tr NOT DETECTED NOT DETECTED   N gonorrhoeae NOT DETECTED NOT DETECTED    Comment: (NOTE) 100  This methodology has not been evaluated in pregnant women or in 200  patients with a history of hysterectomy. 300 400  This methodology will not be performed on patients less than 84  years of age.   CBC     Status: Abnormal   Collection Time: 11/09/15  6:53 AM  Result Value Ref Range   WBC 10.7 3.6 - 11.0 K/uL   RBC 2.74 (L) 3.80 - 5.20  MIL/uL   Hemoglobin 10.1 (L) 12.0 - 16.0 g/dL   HCT 29.2 (L) 35.0 - 47.0 %   MCV 106.6 (H) 80.0 - 100.0 fL   MCH 36.9 (H) 26.0 - 34.0 pg   MCHC 34.7 32.0 - 36.0 g/dL   RDW 14.6 (H) 11.5 - 14.5 %   Platelets 156 150 - 440 K/uL      PHQ2/9: Depression screen [facility] 2/9 11/20/2015 06/14/2015 02/21/2015 12/11/2014 10/11/2014  Decreased Interest 0 0 0 0 0  Down, Depressed, Hopeless 0 0 0 0 0  PHQ - 2 Score 0 0 0 0 0     Fall Risk: Fall Risk  11/20/2015 06/14/2015 02/21/2015 12/11/2014 10/11/2014  Falls in the past year? No No No No No    Assessment & Plan  1. Essential (primary) hypertension  - hydrochlorothiazide (HYDRODIURIL) 12.5 MG tablet; Take 1 tablet (12.5 mg total) by mouth daily.  Dispense: 30 tablet; Refill: 0  2. Microalbuminuria  Not on ARB/ACE because she is a female on child bearing age

## 2015-12-24 ENCOUNTER — Encounter: Payer: Self-pay | Admitting: Obstetrics and Gynecology

## 2015-12-24 ENCOUNTER — Ambulatory Visit (INDEPENDENT_AMBULATORY_CARE_PROVIDER_SITE_OTHER): Payer: BLUE CROSS/BLUE SHIELD | Admitting: Obstetrics and Gynecology

## 2015-12-24 DIAGNOSIS — I10 Essential (primary) hypertension: Secondary | ICD-10-CM

## 2015-12-24 DIAGNOSIS — Z3009 Encounter for other general counseling and advice on contraception: Secondary | ICD-10-CM

## 2015-12-24 MED ORDER — NORETHIN ACE-ETH ESTRAD-FE 1-20 MG-MCG(24) PO CAPS: 1.0000 | ORAL_CAPSULE | Freq: Every day | ORAL | 3 refills | Status: DC

## 2015-12-24 NOTE — Progress Notes (Signed)
   OBSTETRICS POSTPARTUM CLINIC PROGRESS NOTE  Subjective:     Lori Gross is a 20 y.o. [id number] female who presents for a postpartum visit. She is 6 weeks postpartum following a spontaneous vaginal delivery. I have fully reviewed the prenatal and intrapartum course. The delivery was at 39 gestational weeks (IOL for cHTN in pregnancy).  Anesthesia: epidural. Postpartum course has been well. Baby's course has been well. Baby is feeding by bottle - Similac Advance. Bleeding: patient has resumed menses, with last menstrual period 12/13/2015. Bowel function is normal. Bladder function is normal. Patient is sexually active. Contraception method desired is uncertain, but currently using condoms. Postpartum depression screening: negative.  The following portions of the patient's history were reviewed and updated as appropriate: allergies, current medications, past family history, past medical history, past social history, past surgical history and problem list.  Review of Systems Pertinent items noted in HPI and remainder of comprehensive ROS otherwise negative.   Objective:    BP 133/81 (BP Location: Left Arm, Patient Position: Sitting, Cuff Size: Large)   Pulse 79   Ht 5\' 4"  (1.626 m)   Wt 219 lb 11.2 oz (99.7 kg)   LMP 12/13/2015   Breastfeeding? No   BMI 37.71 kg/m   General:  alert and no distress   Breasts:  inspection negative, no nipple discharge or bleeding, no masses or nodularity palpable  Lungs: clear to auscultation bilaterally  Heart:  regular rate and rhythm, S1, S2 normal, no murmur, click, rub or gallop  Abdomen: soft, non-tender; bowel sounds normal; no masses,  no organomegaly.  Well healed Pfannenstiel incision   Vulva:  normal  Vagina: normal vagina, no discharge, exudate, lesion, or erythema  Cervix:  no cervical motion tenderness and no lesions  Corpus: normal size, contour, position, consistency, mobility, non-tender  Adnexa:  normal adnexa and no mass, fullness,  tenderness  Rectal Exam: Not performed.         Labs:  Lab Results  Component Value Date   HGB 10.1 (L) 11/09/2015     Assessment:   Routine postpartum exam.   cHTN Contraception education  Plan:   1. Contraception: currently using condoms, was unsure of contraceptive desires.  Education given regarding options for contraception, including barrier methods, injectable contraception, IUD placement, oral contraceptives, NuvaRing, contraceptive patches, contraceptive implant.  Patient would like to take OCPs for now.  Will prescribe Taytulla (combined low-dose OCP).  2. Mild asymptomatic anemia, patient has been taking PNV with iron.  3. CHTN.  Has seen PCP recently, changed BP meds (discontinued Coragard, now only on HCTZ).  Well controlled.  4. Follow up in: 4-6 month for annual exam as needed.    Hildred LaserAnika Mister Krahenbuhl, MD [facility]

## 2015-12-25 ENCOUNTER — Ambulatory Visit: Payer: BLUE CROSS/BLUE SHIELD | Admitting: Family Medicine

## 2016-01-07 ENCOUNTER — Other Ambulatory Visit: Payer: Self-pay | Admitting: Family Medicine

## 2016-01-07 DIAGNOSIS — I10 Essential (primary) hypertension: Secondary | ICD-10-CM

## 2016-04-20 NOTE — L&D Delivery Note (Signed)
       Delivery Note   Lori Gross is a 21 y.o. [id number] at 2850w0d Estimated Date of Delivery: 03/12/17  PRE-OPERATIVE DIAGNOSIS:  1) 3150w0d pregnancy.   POST-OPERATIVE DIAGNOSIS:  1) 4150w0d pregnancy s/p Vaginal, Spontaneous   Delivery Type: Vaginal, Spontaneous  s/p induction for chronic hypertension  Delivery Anesthesia: Epidural   Labor Complications:   None  ESTIMATED BLOOD LOSS: 120  ml    FINDINGS:   1) female infant, Apgar scores of 9    at 1 minute and 9    at 5 minutes and a birthweight of 131.22  ounces.    2) Nuchal cord: NO  SPECIMENS:   PLACENTA:   Appearance: Intact    Removal: Spontaneous      Disposition:     DISPOSITION:  Infant to left in stable condition in the delivery room, with L&D personnel and mother,  NARRATIVE SUMMARY: Labor course:  [name] is a [id number] at 1750w0d who presented for induction of labor.  She progressed well in labor with misoprostol.  After her second dose of misoprostol, she had undergone cervical change to 3 cm.  Artificial rupture membranes was performed and the third dose of misoprostol placed.  She received an epidural and rapidly proceeded to complete dilation. She evidenced good maternal expulsive effort during the second stage. She went on to deliver a viable infant. The placenta delivered without problems and was noted to be complete. A perineal and vaginal examination was performed. Episiotomy/Lacerations: None   Elonda Huskyavid J. Paulene Tayag, M.D. 02/26/2017 9:08 PM

## 2016-06-24 ENCOUNTER — Encounter: Payer: BLUE CROSS/BLUE SHIELD | Admitting: Obstetrics and Gynecology

## 2016-07-08 ENCOUNTER — Other Ambulatory Visit: Payer: Self-pay | Admitting: Family Medicine

## 2016-07-08 ENCOUNTER — Telehealth: Payer: Self-pay | Admitting: Family Medicine

## 2016-07-08 DIAGNOSIS — I10 Essential (primary) hypertension: Secondary | ICD-10-CM

## 2016-07-08 MED ORDER — METHYLDOPA 250 MG PO TABS: 250.0000 mg | ORAL_TABLET | Freq: Three times a day (TID) | ORAL | 0 refills | Status: DC

## 2016-07-08 NOTE — Telephone Encounter (Signed)
Have appt for this Friday, however just taken 2 pregnancy test that came back positive. Would like to know if she should continue to take her bp medication until seen. [facility] [address]

## 2016-07-10 ENCOUNTER — Encounter: Payer: Self-pay | Admitting: Family Medicine

## 2016-07-10 ENCOUNTER — Ambulatory Visit (INDEPENDENT_AMBULATORY_CARE_PROVIDER_SITE_OTHER): Payer: Medicaid Other | Admitting: Family Medicine

## 2016-07-10 VITALS — BP 138/84 | HR 126 | Temp 98.2°F | Resp 18 | Ht 64.0 in | Wt 239.4 lb

## 2016-07-10 DIAGNOSIS — I10 Essential (primary) hypertension: Secondary | ICD-10-CM

## 2016-07-10 DIAGNOSIS — Z3201 Encounter for pregnancy test, result positive: Secondary | ICD-10-CM

## 2016-07-10 DIAGNOSIS — N926 Irregular menstruation, unspecified: Secondary | ICD-10-CM | POA: Diagnosis not present

## 2016-07-10 DIAGNOSIS — Z23 Encounter for immunization: Secondary | ICD-10-CM | POA: Diagnosis not present

## 2016-07-10 LAB — POCT URINE PREGNANCY: Preg Test, Ur: POSITIVE — AB

## 2016-07-10 MED ORDER — PRENATAL VITAMINS 28-0.8 MG PO TABS: 1.0000 | ORAL_TABLET | Freq: Every day | ORAL | 0 refills | Status: DC

## 2016-07-10 NOTE — Progress Notes (Signed)
Name: Lori Gross   MRN: [id number]    DOB: October 31, 1995   Date:07/10/2016       Progress Note  Subjective  Chief Complaint  Chief Complaint  Patient presents with  . Amenorrhea    last cycle was 05/29/16  . Flu Vaccine    HPI  Positive Pregnancy test: she was given ocp after birth of her daughter last July but was worried about weight gain and stopped taking it. She noticed that she skipped her last cycle, had breast tenderness and nausea, positive pregnancy test earlier this week. She came in to confirm her pregnancy  HTN: since her teenager years, her bp went up during the end of her last pregnancy and had to be induced. She called earlier this week to let me know she was pregnant and we switched her from HCTZ to Methyldopa. She is tolerating medication well and bp is okay today. Advised to keep it closer to 120/80 and monitor at home. She did not take her medication this am.   Patient Active Problem List   Diagnosis Date Noted  . Hypertension, benign 11/20/2015  . Dysmenorrhea 10/08/2014  . 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 Microalbuminuria 10/08/2014  . Obesity (BMI 30.0-34.9) 10/08/2014  . Migraine without aura and responsive to treatment 10/08/2014    No past surgical history on file.  Family History  Problem Relation Age of Onset  . Hypertension Mother   . Cancer Mother 32    Common Bile Duct  . Hypertension Sister   . Hypertension Brother   . Diabetes Paternal Grandmother   . Cancer Paternal Grandmother 76    brain cancer  . Diabetes Paternal Grandfather   . Hypertension Father   . Cancer Father 23    kidney cancer    Social History   Social History  . Marital status: Single    Spouse name: N/A  . Number of children: N/A  . Years of education: N/A   Occupational History  . Not on file.   Social History Main Topics  . Smoking status: Never Smoker  . Smokeless tobacco: Never Used  . Alcohol use No  . Drug use: No  . Sexual activity: Yes   Partners: Male    Birth control/ protection: None   Other Topics Concern  . Not on file   Social History Narrative   Dating for 5 years Lisbeth Ply ) they have  a baby girl - Rodman Pickle on July 21st, 2017 and the are  living together   Working full time at [employer] full time.      Current Outpatient Prescriptions:  .  methyldopa (ALDOMET) 250 MG tablet, Take 1 tablet (250 mg total) by mouth 3 (three) times daily., Disp: 60 tablet, Rfl: 0  No Known Allergies   ROS  Constitutional: Negative for fever, positive for  weight change.  Respiratory: Negative for cough and shortness of breath.   Cardiovascular: Negative for chest pain or palpitations.  Gastrointestinal: Negative for abdominal pain, no bowel changes. Positive for nausea Musculoskeletal: Negative for gait problem or joint swelling.  Skin: Negative for rash.  Neurological: Negative for dizziness or headache.  No other specific complaints in a complete review of systems (except as listed in HPI above).  Objective  Vitals:   07/10/16 0850  BP: 138/84  Pulse: (!) 126  Resp: 18  Temp: 98.2 F (36.8 C)  SpO2: 98%  Weight: 239 lb 6 oz (108.6 kg)  Height: 5\' 4"  (1.626 m)  Body mass index is 41.09 kg/m.  Physical Exam  Constitutional: Patient appears well-developed and well-nourished. Obese  No distress.  HEENT: head atraumatic, normocephalic, pupils equal and reactive to light, neck supple, throat within normal limits Cardiovascular: Normal rate, regular rhythm and normal heart sounds.  No murmur heard. No BLE edema. Pulmonary/Chest: Effort normal and breath sounds normal. No respiratory distress. Abdominal: Soft.  There is no tenderness. Psychiatric: Patient has a normal mood and affect. behavior is normal. Judgment and thought content normal.  Recent Results (from the past 2160 hour(s))  POCT urine pregnancy     Status: Abnormal   Collection Time: 07/10/16  8:57 AM  Result Value Ref Range   Preg Test,  Ur Positive (A) Negative      PHQ2/9: Depression screen [facility] 11/20/2015 06/14/2015 02/21/2015 12/11/2014 10/11/2014  Decreased Interest 0 0 0 0 0  Down, Depressed, Hopeless 0 0 0 0 0  PHQ - 2 Score 0 0 0 0 0     Fall Risk: Fall Risk  11/20/2015 06/14/2015 02/21/2015 12/11/2014 10/11/2014  Falls in the past year? No No No No No     Assessment & Plan  1. Missed menses  - POCT urine pregnancy  2. Positive pregnancy test  - Ambulatory referral to Obstetrics / Gynecology LMP 05/29/2016 EDD 03/05/2017 Start pre-natal vitamins, avoid alcohol, drugs or otc medication until seen by OB  3. Needs flu shot  - Flu Vaccine QUAD 36+ mos IM  4. Essential (primary) hypertension  Continue Methyldopa, and follow up with OB

## 2016-07-20 ENCOUNTER — Other Ambulatory Visit (INDEPENDENT_AMBULATORY_CARE_PROVIDER_SITE_OTHER): Payer: Self-pay

## 2016-07-20 ENCOUNTER — Ambulatory Visit (INDEPENDENT_AMBULATORY_CARE_PROVIDER_SITE_OTHER): Payer: Self-pay | Admitting: Obstetrics and Gynecology

## 2016-07-20 VITALS — BP 124/75 | HR 121 | Wt 236.1 lb

## 2016-07-20 DIAGNOSIS — O10911 Unspecified pre-existing hypertension complicating pregnancy, first trimester: Secondary | ICD-10-CM

## 2016-07-20 DIAGNOSIS — Z3491 Encounter for supervision of normal pregnancy, unspecified, first trimester: Secondary | ICD-10-CM

## 2016-07-20 NOTE — Progress Notes (Signed)
Lori Gross presents for NOB nurse interview visit. Pregnancy confirmation done 07/15/16.  G- 2 P-1. Pregnancy education material explained and given. No cats in the home. NOB labs ordered. (TSH/HbgA1c due to Increased BMI), (sickle cell). HIV labs and Drug screen were explained optional and she did not decline. Drug screen ordered. PNV encouraged. Genetic screening options discussed. Genetic testing: Ordered/Declined/Unsure.  Pt may discuss with provider. Pt. To follow up with provider in 6 weeks for NOB physical.  All questions answered.

## 2016-07-21 LAB — RUBELLA SCREEN: RUBELLA: 2.56 {index} (ref >0.99)

## 2016-07-21 LAB — ABO AND RH: RH TYPE: NEGATIVE

## 2016-07-21 LAB — HEMOGLOBIN A1C
ESTIMATED AVERAGE GLUCOSE: 97 mg/dL
HEMOGLOBIN A1C: 5 % (ref 4.8–5.6)

## 2016-07-21 LAB — ANTIBODY SCREEN: ANTIBODY SCREEN: NEGATIVE

## 2016-07-21 LAB — CBC WITH DIFFERENTIAL
Basophils Absolute: 0 10*3/uL (ref 0.0–0.2)
Basos: 0 %
EOS (ABSOLUTE): 0 10*3/uL (ref 0.0–0.4)
EOS: 1 %
HEMATOCRIT: 34.7 % (ref 34.0–46.6)
Hemoglobin: 12.1 g/dL (ref 11.1–15.9)
IMMATURE GRANULOCYTES: 0 %
Immature Grans (Abs): 0 10*3/uL (ref 0.0–0.1)
LYMPHS ABS: 1.8 10*3/uL (ref 0.7–3.1)
Lymphs: 22 %
MCH: 33.3 pg — ABNORMAL HIGH (ref 26.6–33.0)
MCHC: 34.9 g/dL (ref 31.5–35.7)
MCV: 96 fL (ref 79–97)
MONOS ABS: 0.3 10*3/uL (ref 0.1–0.9)
Monocytes: 4 %
Neutrophils Absolute: 6 10*3/uL (ref 1.4–7.0)
Neutrophils: 73 %
RBC: 3.63 x10E6/uL — ABNORMAL LOW (ref 3.77–5.28)
RDW: 15.3 % (ref 12.3–15.4)
WBC: 8.3 10*3/uL (ref 3.4–10.8)

## 2016-07-21 LAB — SICKLE CELL SCREEN: Sickle Cell Screen: NEGATIVE

## 2016-07-21 LAB — TSH: TSH: 0.689 u[IU]/mL (ref 0.450–4.500)

## 2016-07-21 LAB — RPR: RPR Ser Ql: NONREACTIVE

## 2016-07-21 LAB — HEPATITIS B SURFACE ANTIGEN: HEP B S AG: NEGATIVE

## 2016-07-21 LAB — BETA HCG QUANT (REF LAB): hCG Quant: 23759 m[IU]/mL

## 2016-07-21 LAB — VARICELLA ZOSTER ANTIBODY, IGG: Varicella zoster IgG: 2099 index (ref >165)

## 2016-07-21 LAB — HIV ANTIBODY (ROUTINE TESTING W REFLEX): HIV Screen 4th Generation wRfx: NONREACTIVE

## 2016-07-22 LAB — MONITOR DRUG PROFILE 14(MW)

## 2016-07-22 LAB — URINALYSIS, ROUTINE W REFLEX MICROSCOPIC
BILIRUBIN UA: NEGATIVE
Glucose, UA: NEGATIVE
KETONES UA: NEGATIVE
Leukocytes, UA: NEGATIVE
NITRITE UA: NEGATIVE
PH UA: 5 (ref 5.0–7.5)
Protein, UA: NEGATIVE
RBC UA: NEGATIVE
SPEC GRAV UA: 1.029 (ref 1.005–1.030)
UUROB: 0.2 mg/dL (ref 0.2–1.0)

## 2016-07-22 LAB — NICOTINE SCREEN, URINE

## 2016-07-22 LAB — URINE CULTURE

## 2016-08-10 ENCOUNTER — Other Ambulatory Visit: Payer: Self-pay | Admitting: Family Medicine

## 2016-08-10 DIAGNOSIS — Z3201 Encounter for pregnancy test, result positive: Secondary | ICD-10-CM

## 2016-08-19 ENCOUNTER — Other Ambulatory Visit: Payer: Self-pay | Admitting: Family Medicine

## 2016-08-20 NOTE — Telephone Encounter (Signed)
Patient requesting refill of Methyldopa to [facility].  

## 2016-09-01 ENCOUNTER — Encounter: Payer: Self-pay | Admitting: Obstetrics and Gynecology

## 2016-09-01 ENCOUNTER — Ambulatory Visit (INDEPENDENT_AMBULATORY_CARE_PROVIDER_SITE_OTHER): Payer: Medicaid Other | Admitting: Obstetrics and Gynecology

## 2016-09-01 VITALS — BP 116/78 | HR 98 | Wt 233.8 lb

## 2016-09-01 DIAGNOSIS — O10012 Pre-existing essential hypertension complicating pregnancy, second trimester: Secondary | ICD-10-CM

## 2016-09-01 DIAGNOSIS — O0991 Supervision of high risk pregnancy, unspecified, first trimester: Secondary | ICD-10-CM | POA: Diagnosis not present

## 2016-09-01 DIAGNOSIS — Z1379 Encounter for other screening for genetic and chromosomal anomalies: Secondary | ICD-10-CM

## 2016-09-01 DIAGNOSIS — O99210 Obesity complicating pregnancy, unspecified trimester: Secondary | ICD-10-CM

## 2016-09-01 DIAGNOSIS — R519 Headache, unspecified: Secondary | ICD-10-CM

## 2016-09-01 DIAGNOSIS — R51 Headache: Secondary | ICD-10-CM

## 2016-09-01 LAB — POCT URINALYSIS DIPSTICK
BILIRUBIN UA: NEGATIVE
GLUCOSE UA: NEGATIVE
KETONES UA: NEGATIVE
Leukocytes, UA: NEGATIVE
Nitrite, UA: NEGATIVE
PH UA: 6 (ref 5.0–8.0)
RBC UA: NEGATIVE
Urobilinogen, UA: 0.2 E.U./dL

## 2016-09-01 NOTE — Progress Notes (Signed)
OBSTETRIC INITIAL PRENATAL VISIT  Subjective:    Lori Gross is being seen today for her first obstetrical visit.  This is not a planned pregnancy. She is a G2P1001 female at [redacted]w[redacted]d gestation, Estimated Date of Delivery: 03/12/17 with last menstrual period 05/29/2016 (approximate). inconsistent with 8 week sono. Her obstetrical history is significant for Rh negative status, obesity and chronic HTN  Relationship with FOB: spouse, living together. Patient is unsure about intent intend to breast feed. Pregnancy history fully reviewed.    Obstetric History   G2   P1   T1   P0   A0   L1    SAB0   TAB0   Ectopic0   Multiple0   Live Births1     # Outcome Date GA Lbr Len/2nd Weight Sex Delivery Anes PTL Lv  2 Current           1 Term 11/08/15 [redacted]w[redacted]d / 01:44 6 lb 13 oz (3.09 kg) F Vag-Spont EPI  LIV     Name: Worthington,PENDINGBABY     Complications: Chronic hypertension affecting pregnancy     Apgar1:  7                Apgar5: 9    Obstetric Comments  Menstrual age: 79    Age 1st Pregnancy: 19    Gynecologic History:  Last pap smear was 02/2015.  Results were normal.  Denies h/o abnormal pap smears in the past.  Denies history of STIs.    Past Medical History:  Diagnosis Date  . Benign headache   . Dysmenorrhea   . Grieving   . Hypertension   . Marijuana use 2016   positive UDS  . Microalbuminuria   . Migraine without aura and without status migrainosus, not intractable   . Obesity   . Urticaria      Family History  Problem Relation Age of Onset  . Hypertension Mother   . Cancer Mother 71       Common Bile Duct  . Hypertension Sister   . Hypertension Brother   . Diabetes Paternal Grandmother   . Cancer Paternal Grandmother 33       brain cancer  . Diabetes Paternal Grandfather   . Hypertension Father   . Cancer Father 56       kidney cancer     Past Surgical History:  Procedure Laterality Date  . NO PAST SURGERIES       Social History   Social  History  . Marital status: Single    Spouse name: N/A  . Number of children: N/A  . Years of education: N/A   Occupational History  . Not on file.   Social History Main Topics  . Smoking status: Never Smoker  . Smokeless tobacco: Never Used  . Alcohol use No  . Drug use: No  . Sexual activity: Yes    Partners: Male    Birth control/ protection: None   Other Topics Concern  . Not on file   Social History Narrative   Dating for 5 years Lisbeth Ply ) they have  a baby girl - Rodman Pickle on July 21st, 2017 and the are  living together   Working full time at [employer] full time.      Current Outpatient Prescriptions on File Prior to Visit  Medication Sig Dispense Refill  . methyldopa (ALDOMET) 250 MG tablet TAKE 1 TABLET (250 MG TOTAL) BY MOUTH 3 (THREE) TIMES DAILY. 60 tablet 0  .  Prenatal Vit-Fe Fumarate-FA (PRENATAL VITAMINS) 28-0.8 MG TABS TAKE 1 TABLET BY MOUTH DAILY. 30 tablet 0   No current facility-administered medications on file prior to visit.      No Known Allergies   Review of Systems General:Not Present- Fever, Weight Loss and Weight Gain. Skin:Not Present- Rash. HEENT:Not Present- Blurred Vision, Headache and Bleeding Gums. Respiratory:Not Present- Difficulty Breathing. Breast:Not Present- Breast Mass. Cardiovascular:Not Present- Chest Pain, Elevated Blood Pressure, Fainting / Blacking Out and Shortness of Breath. Gastrointestinal:Not Present- Abdominal Pain, Constipation, Nausea and Vomiting. Female Genitourinary:Not Present- Frequency, Painful Urination, Pelvic Pain, Vaginal Bleeding, Vaginal Discharge, Contractions, regular, Fetal Movements Decreased, Urinary Complaints and Vaginal Fluid. Musculoskeletal:Not Present- Back Pain and Leg Cramps. Neurological:Present - Daily dull headache (has not tried any interventions). Not Present- Dizziness. Psychiatric:Not Present- Depression.     Objective:   Blood pressure 116/78, pulse 98, weight  233 lb 12.8 oz (106.1 kg), last menstrual period 05/29/2016, not currently breastfeeding.  Body mass index is 40.13 kg/m.   General Appearance:    Alert, cooperative, no distress, appears stated age, morbid obesity  Head:    Normocephalic, without obvious abnormality, atraumatic  Eyes:    PERRL, conjunctiva/corneas clear, EOM's intact, both eyes  Ears:    Normal external ear canals, both ears  Nose:   Nares normal, septum midline, mucosa normal, no drainage or sinus tenderness  Throat:   Lips, mucosa, and tongue normal; teeth and gums normal  Neck:   Supple, symmetrical, trachea midline, no adenopathy; thyroid: no enlargement/tenderness/nodules; no carotid bruit or JVD  Back:     Symmetric, no curvature, ROM normal, no CVA tenderness  Lungs:     Clear to auscultation bilaterally, respirations unlabored  Chest Wall:    No tenderness or deformity   Heart:    Regular rate and rhythm, S1 and S2 normal, no murmur, rub or gallop  Breast Exam:    No tenderness, masses, or nipple abnormality  Abdomen:     Soft, non-tender, bowel sounds active all four quadrants, no masses, no organomegaly.  FH 12.  FHT 156  bpm.  Genitalia:    Pelvic:external genitalia normal, vagina without lesions, discharge, or tenderness, rectovaginal septum  normal. Cervix normal in appearance, no cervical motion tenderness, no adnexal masses or tenderness.  Pregnancy positive findings: uterine enlargement: 12 wk size, nontender.   Rectal:    Normal external sphincter.  No hemorrhoids appreciated. Internal exam not done.   Extremities:   Extremities normal, atraumatic, no cyanosis or edema  Pulses:   2+ and symmetric all extremities  Skin:   Skin color, texture, turgor normal, no rashes or lesions  Lymph nodes:   Cervical, supraclavicular, and axillary nodes normal  Neurologic:   CNII-XII intact, normal strength, sensation and reflexes throughout     Assessment:    Pregnancy at 12 and 4/7 weeks  Chronic HTN in  pregnancy Morbid obesity Rh negative status Headache  Plan:   Initial labs reviewed.    Prenatal vitamins encouraged. Problem list reviewed and updated. New OB counseling:  The patient has been given an overview regarding routine prenatal care.  Recommendations regarding diet, weight gain, and exercise in pregnancy were given.  Patient should not gain more than 15 lb by the end of her pregnancy.  Prenatal testing, optional genetic testing, and ultrasound use in pregnancy were reviewed.  Discussed various methods of screening. Patient desires NIPT techonology: MaterniT21 ordered. Benefits of Breast Feeding were discussed. The patient is encouraged to consider nursing her baby post  partum. Continue Aldomet for cHTN. Also will get baseline EKG (patient notes last EKG was in 2005) For early glucola next visit as patient with risk factors for development of diabetes.  Headache with a h/o migraines.  Continue to manage with Tylenol as needed.  Will need rhogam at 28 weeks and at delivery.    Follow up in 4 weeks.   50% of 30 min visit spent on counseling and coordination of care.   Medicaid pregnancy form completed today.    [name], MD [facility]

## 2016-09-02 ENCOUNTER — Encounter: Payer: Self-pay | Admitting: Obstetrics and Gynecology

## 2016-09-02 LAB — PAIN MGT SCRN (14 DRUGS), UR
Amphetamine Scrn, Ur: POSITIVE ng/mL
BARBITURATE SCREEN URINE: NEGATIVE ng/mL
BENZODIAZEPINE SCREEN, URINE: NEGATIVE ng/mL
Buprenorphine, Urine: NEGATIVE ng/mL
CANNABINOIDS UR QL SCN: NEGATIVE ng/mL
CREATININE(CRT), U: 261.3 mg/dL (ref 20.0–300.0)
Cocaine (Metab) Scrn, Ur: NEGATIVE ng/mL
FENTANYL, URINE: NEGATIVE pg/mL
MEPERIDINE SCREEN, URINE: NEGATIVE ng/mL
Methadone Screen, Urine: NEGATIVE ng/mL
OPIATE SCREEN URINE: NEGATIVE ng/mL
OXYCODONE+OXYMORPHONE UR QL SCN: NEGATIVE ng/mL
Ph of Urine: 5.4 (ref 4.5–8.9)
Phencyclidine Qn, Ur: NEGATIVE ng/mL
Propoxyphene Scrn, Ur: NEGATIVE ng/mL
Tramadol Screen, Urine: NEGATIVE ng/mL

## 2016-09-02 LAB — RENAL FUNCTION PANEL
ALBUMIN: 4.1 g/dL (ref 3.5–5.5)
BUN/Creatinine Ratio: 11 (ref 9–23)
BUN: 6 mg/dL (ref 6–20)
CHLORIDE: 103 mmol/L (ref 96–106)
CO2: 21 mmol/L (ref 18–29)
Calcium: 9.5 mg/dL (ref 8.7–10.2)
Creatinine, Ser: 0.56 mg/dL — ABNORMAL LOW (ref 0.57–1.00)
GFR calc non Af Amer: 133 mL/min/{1.73_m2} (ref >59)
GFR, EST AFRICAN AMERICAN: 153 mL/min/{1.73_m2} (ref >59)
Glucose: 72 mg/dL (ref 65–99)
Phosphorus: 4.7 mg/dL — ABNORMAL HIGH (ref 2.5–4.5)
Potassium: 3.9 mmol/L (ref 3.5–5.2)
Sodium: 139 mmol/L (ref 134–144)

## 2016-09-06 LAB — MATERNIT21  PLUS CORE+ESS+SCA, BLOOD
CHROMOSOME 13: NEGATIVE
CHROMOSOME 18: NEGATIVE
CHROMOSOME 21: NEGATIVE
Y CHROMOSOME: DETECTED

## 2016-09-08 ENCOUNTER — Telehealth: Payer: Self-pay

## 2016-09-08 NOTE — Telephone Encounter (Signed)
-----   Message from Hildred LaserAnika Cherry, MD sent at 09/07/2016  4:08 PM EDT ----- Please inform patient of normal MaterniT21 testing.  I believe patient does not desire to know the sex, but does desire for a family member to be informed.

## 2016-09-08 NOTE — Telephone Encounter (Signed)
Called pt informed her of negative genetic results, desires sex of baby in envelope for family member to pick up. Placed up front.

## 2016-09-10 ENCOUNTER — Other Ambulatory Visit: Payer: Self-pay | Admitting: Family Medicine

## 2016-09-10 DIAGNOSIS — Z3201 Encounter for pregnancy test, result positive: Secondary | ICD-10-CM

## 2016-09-23 ENCOUNTER — Encounter: Payer: Self-pay | Admitting: Family Medicine

## 2016-09-23 ENCOUNTER — Other Ambulatory Visit: Payer: Self-pay | Admitting: Family Medicine

## 2016-09-25 ENCOUNTER — Encounter: Payer: Self-pay | Admitting: Family Medicine

## 2016-09-25 ENCOUNTER — Other Ambulatory Visit: Payer: Self-pay | Admitting: Family Medicine

## 2016-09-25 DIAGNOSIS — Z3201 Encounter for pregnancy test, result positive: Secondary | ICD-10-CM

## 2016-09-25 NOTE — Telephone Encounter (Signed)
Please advise patient that 30-day supply is provided, but her OB/GYN should take over prescribing prenatal vitamins going forward. Thank you!

## 2016-09-29 ENCOUNTER — Ambulatory Visit (INDEPENDENT_AMBULATORY_CARE_PROVIDER_SITE_OTHER): Payer: Medicaid Other | Admitting: Obstetrics and Gynecology

## 2016-09-29 ENCOUNTER — Encounter: Payer: Self-pay | Admitting: Obstetrics and Gynecology

## 2016-09-29 ENCOUNTER — Other Ambulatory Visit: Payer: Self-pay | Admitting: Obstetrics and Gynecology

## 2016-09-29 ENCOUNTER — Other Ambulatory Visit: Payer: Medicaid Other

## 2016-09-29 VITALS — BP 114/76 | HR 98 | Wt 230.2 lb

## 2016-09-29 DIAGNOSIS — Z3492 Encounter for supervision of normal pregnancy, unspecified, second trimester: Secondary | ICD-10-CM

## 2016-09-29 DIAGNOSIS — O10012 Pre-existing essential hypertension complicating pregnancy, second trimester: Secondary | ICD-10-CM

## 2016-09-29 DIAGNOSIS — O99210 Obesity complicating pregnancy, unspecified trimester: Secondary | ICD-10-CM

## 2016-09-29 LAB — POCT URINALYSIS DIPSTICK
Bilirubin, UA: NEGATIVE
Blood, UA: NEGATIVE
Glucose, UA: NEGATIVE
KETONES UA: NEGATIVE
Leukocytes, UA: NEGATIVE
Nitrite, UA: NEGATIVE
PH UA: 5 (ref 5.0–8.0)
Spec Grav, UA: 1.025 (ref 1.010–1.025)
Urobilinogen, UA: 0.2 E.U./dL

## 2016-09-29 NOTE — Progress Notes (Signed)
ROB:  No complaints.  Taking Aldomet.  R/B of ASA discussed.  She will begin 81 mg daily.  Scheduled FAS 19 wks.  AFP today, 1 hour GCT today (early).

## 2016-09-30 LAB — GLUCOSE, 1 HOUR GESTATIONAL: GESTATIONAL DIABETES SCREEN: 115 mg/dL (ref 65–139)

## 2016-10-01 ENCOUNTER — Telehealth: Payer: Self-pay

## 2016-10-01 NOTE — Telephone Encounter (Signed)
-----   Message from Hildred LaserAnika Cherry, MD sent at 09/30/2016 11:28 PM EDT ----- Please inform of normal early glucola. Will need to repeat at 28 weeks.

## 2016-10-01 NOTE — Telephone Encounter (Signed)
Called pt informed her of negative 1hr gtt, pt gave verbal understanding.

## 2016-10-02 LAB — AFP, SERUM, OPEN SPINA BIFIDA
AFP MoM: 1.04
AFP VALUE AFPOSL: 28.7 ng/mL
Gest. Age on Collection Date: 16.6 weeks
Maternal Age At EDD: 23.1 yr
OSBR RISK 1 IN: 10000
Test Results:: NEGATIVE
Weight: 230 [lb_av]

## 2016-10-09 ENCOUNTER — Other Ambulatory Visit: Payer: Self-pay | Admitting: Obstetrics and Gynecology

## 2016-10-09 DIAGNOSIS — Z369 Encounter for antenatal screening, unspecified: Secondary | ICD-10-CM

## 2016-10-19 ENCOUNTER — Other Ambulatory Visit: Payer: Self-pay | Admitting: Family Medicine

## 2016-10-20 ENCOUNTER — Ambulatory Visit (INDEPENDENT_AMBULATORY_CARE_PROVIDER_SITE_OTHER): Payer: Medicaid Other

## 2016-10-20 DIAGNOSIS — Z369 Encounter for antenatal screening, unspecified: Secondary | ICD-10-CM | POA: Diagnosis not present

## 2016-10-20 DIAGNOSIS — Z3482 Encounter for supervision of other normal pregnancy, second trimester: Secondary | ICD-10-CM

## 2016-10-27 ENCOUNTER — Ambulatory Visit (INDEPENDENT_AMBULATORY_CARE_PROVIDER_SITE_OTHER): Payer: Medicaid Other | Admitting: Obstetrics and Gynecology

## 2016-10-27 VITALS — BP 103/70 | HR 92 | Wt 229.3 lb

## 2016-10-27 DIAGNOSIS — O10019 Pre-existing essential hypertension complicating pregnancy, unspecified trimester: Secondary | ICD-10-CM

## 2016-10-27 DIAGNOSIS — O0992 Supervision of high risk pregnancy, unspecified, second trimester: Secondary | ICD-10-CM

## 2016-10-27 DIAGNOSIS — O2612 Low weight gain in pregnancy, second trimester: Secondary | ICD-10-CM

## 2016-10-27 DIAGNOSIS — O4440 Low lying placenta NOS or without hemorrhage, unspecified trimester: Secondary | ICD-10-CM

## 2016-10-27 LAB — POCT URINALYSIS DIPSTICK
BILIRUBIN UA: NEGATIVE
GLUCOSE UA: NEGATIVE
Ketones, UA: NEGATIVE
Nitrite, UA: NEGATIVE
RBC UA: NEGATIVE
Spec Grav, UA: >=1.03 — AB (ref 1.010–1.025)
Urobilinogen, UA: 0.2 E.U./dL
pH, UA: 6 (ref 5.0–8.0)

## 2016-10-27 NOTE — Progress Notes (Signed)
ROB: Doing well, no major complaints. Does note some occasional cramping. Continue Aldomet and aspirin. Normal AFP and early glucola, will repeat at 28 weeks. Anatomy scan with LLP, will need f/u sono at 28 weeks. Discussed poor weight gain of pregnancy, to increase whole milk consumption, protein intake.

## 2016-11-02 ENCOUNTER — Other Ambulatory Visit: Payer: Self-pay | Admitting: Obstetrics and Gynecology

## 2016-11-02 DIAGNOSIS — IMO0002 Reserved for concepts with insufficient information to code with codable children: Secondary | ICD-10-CM

## 2016-11-02 DIAGNOSIS — Z0489 Encounter for examination and observation for other specified reasons: Secondary | ICD-10-CM

## 2016-11-03 ENCOUNTER — Ambulatory Visit (INDEPENDENT_AMBULATORY_CARE_PROVIDER_SITE_OTHER): Payer: Medicaid Other

## 2016-11-03 DIAGNOSIS — Z048 Encounter for examination and observation for other specified reasons: Secondary | ICD-10-CM | POA: Diagnosis not present

## 2016-11-03 DIAGNOSIS — Z0489 Encounter for examination and observation for other specified reasons: Secondary | ICD-10-CM

## 2016-11-03 DIAGNOSIS — IMO0002 Reserved for concepts with insufficient information to code with codable children: Secondary | ICD-10-CM

## 2016-11-18 ENCOUNTER — Other Ambulatory Visit: Payer: Self-pay | Admitting: Family Medicine

## 2016-11-19 NOTE — Telephone Encounter (Signed)
Patient requesting refill of Methyldopa to [facility].  

## 2016-11-24 ENCOUNTER — Ambulatory Visit (INDEPENDENT_AMBULATORY_CARE_PROVIDER_SITE_OTHER): Payer: Medicaid Other | Admitting: Obstetrics and Gynecology

## 2016-11-24 ENCOUNTER — Encounter: Payer: Self-pay | Admitting: Obstetrics and Gynecology

## 2016-11-24 VITALS — BP 109/73 | HR 103 | Wt 234.4 lb

## 2016-11-24 DIAGNOSIS — O0992 Supervision of high risk pregnancy, unspecified, second trimester: Secondary | ICD-10-CM

## 2016-11-24 DIAGNOSIS — O10019 Pre-existing essential hypertension complicating pregnancy, unspecified trimester: Secondary | ICD-10-CM

## 2016-11-24 DIAGNOSIS — O4440 Low lying placenta NOS or without hemorrhage, unspecified trimester: Secondary | ICD-10-CM

## 2016-11-24 LAB — POCT URINALYSIS DIPSTICK
BILIRUBIN UA: NEGATIVE
GLUCOSE UA: NEGATIVE
Ketones, UA: NEGATIVE
Leukocytes, UA: NEGATIVE
NITRITE UA: NEGATIVE
PH UA: 6 (ref 5.0–8.0)
PROTEIN UA: NEGATIVE
RBC UA: NEGATIVE
Spec Grav, UA: 1.02 (ref 1.010–1.025)
Urobilinogen, UA: 0.2 E.U./dL

## 2016-11-24 NOTE — Progress Notes (Signed)
ROB:  Taking Aldomet - HTN controlled.  Taking ASA.  Has LLP - U/S f/u scheduled in 4 weeks.  1hr GCT and rhogam in 4 weeks.

## 2016-12-18 ENCOUNTER — Other Ambulatory Visit: Payer: Self-pay | Admitting: Family Medicine

## 2016-12-22 ENCOUNTER — Ambulatory Visit (INDEPENDENT_AMBULATORY_CARE_PROVIDER_SITE_OTHER): Payer: Medicaid Other

## 2016-12-22 ENCOUNTER — Other Ambulatory Visit: Payer: Medicaid Other

## 2016-12-22 ENCOUNTER — Ambulatory Visit (INDEPENDENT_AMBULATORY_CARE_PROVIDER_SITE_OTHER): Payer: Medicaid Other | Admitting: Obstetrics and Gynecology

## 2016-12-22 VITALS — BP 128/74 | HR 106 | Wt 237.8 lb

## 2016-12-22 DIAGNOSIS — O26893 Other specified pregnancy related conditions, third trimester: Secondary | ICD-10-CM

## 2016-12-22 DIAGNOSIS — O4440 Low lying placenta NOS or without hemorrhage, unspecified trimester: Secondary | ICD-10-CM

## 2016-12-22 DIAGNOSIS — Z131 Encounter for screening for diabetes mellitus: Secondary | ICD-10-CM

## 2016-12-22 DIAGNOSIS — Z3483 Encounter for supervision of other normal pregnancy, third trimester: Secondary | ICD-10-CM

## 2016-12-22 DIAGNOSIS — O99210 Obesity complicating pregnancy, unspecified trimester: Secondary | ICD-10-CM

## 2016-12-22 DIAGNOSIS — O09893 Supervision of other high risk pregnancies, third trimester: Secondary | ICD-10-CM | POA: Diagnosis not present

## 2016-12-22 DIAGNOSIS — Z6791 Unspecified blood type, Rh negative: Secondary | ICD-10-CM | POA: Diagnosis not present

## 2016-12-22 DIAGNOSIS — Z23 Encounter for immunization: Secondary | ICD-10-CM

## 2016-12-22 DIAGNOSIS — Z130 Encounter for screening for diseases of the blood and blood-forming organs and certain disorders involving the immune mechanism: Secondary | ICD-10-CM

## 2016-12-22 DIAGNOSIS — O10919 Unspecified pre-existing hypertension complicating pregnancy, unspecified trimester: Secondary | ICD-10-CM

## 2016-12-22 LAB — POCT URINALYSIS DIPSTICK
BILIRUBIN UA: NEGATIVE
Blood, UA: NEGATIVE
GLUCOSE UA: NEGATIVE
KETONES UA: NEGATIVE
LEUKOCYTES UA: NEGATIVE
NITRITE UA: NEGATIVE
Spec Grav, UA: 1.02 (ref 1.010–1.025)
Urobilinogen, UA: 0.2 E.U./dL
pH, UA: 6.5 (ref 5.0–8.0)

## 2016-12-22 MED ORDER — RHO D IMMUNE GLOBULIN 1500 UNITS IM SOSY
1500.0000 [IU] | PREFILLED_SYRINGE | Freq: Once | INTRAMUSCULAR | Status: AC
  Administered 2016-12-22: 1500 [IU] via INTRAMUSCULAR

## 2016-12-22 MED ORDER — TETANUS-DIPHTH-ACELL PERTUSSIS 5-2.5-18.5 LF-MCG/0.5 IM SUSP
0.5000 mL | Freq: Once | INTRAMUSCULAR | Status: AC
  Administered 2016-12-22: 0.5 mL via INTRAMUSCULAR

## 2016-12-22 NOTE — Progress Notes (Signed)
ROB

## 2016-12-22 NOTE — Progress Notes (Signed)
ROB: Denies complaints today. For 28 week labs today.  Desires to bottle feed,  Desires combined OCPs for contraception. For Tdap today and Rhogam, signed blood consent, discussed cord blood banking. Reiterated need for antenatal testing and growth scan starting at 32 weeks.

## 2016-12-23 ENCOUNTER — Other Ambulatory Visit: Payer: Self-pay | Admitting: Family Medicine

## 2016-12-23 ENCOUNTER — Telehealth: Payer: Self-pay

## 2016-12-23 DIAGNOSIS — R7309 Other abnormal glucose: Secondary | ICD-10-CM

## 2016-12-23 LAB — CBC WITH DIFFERENTIAL/PLATELET
BASOS ABS: 0 10*3/uL (ref 0.0–0.2)
Basos: 0 %
EOS (ABSOLUTE): 0.1 10*3/uL (ref 0.0–0.4)
Eos: 1 %
HEMOGLOBIN: 9.3 g/dL — AB (ref 11.1–15.9)
Hematocrit: 27.7 % — ABNORMAL LOW (ref 34.0–46.6)
IMMATURE GRANULOCYTES: 0 %
Immature Grans (Abs): 0 10*3/uL (ref 0.0–0.1)
LYMPHS: 16 %
Lymphocytes Absolute: 1.4 10*3/uL (ref 0.7–3.1)
MCH: 33.5 pg — ABNORMAL HIGH (ref 26.6–33.0)
MCHC: 33.6 g/dL (ref 31.5–35.7)
MCV: 100 fL — ABNORMAL HIGH (ref 79–97)
MONOCYTES: 4 %
Monocytes Absolute: 0.3 10*3/uL (ref 0.1–0.9)
NEUTROS ABS: 6.9 10*3/uL (ref 1.4–7.0)
NEUTROS PCT: 79 %
PLATELETS: 265 10*3/uL (ref 150–379)
RBC: 2.78 x10E6/uL — AB (ref 3.77–5.28)
RDW: 15.3 % (ref 12.3–15.4)
WBC: 8.6 10*3/uL (ref 3.4–10.8)

## 2016-12-23 LAB — GLUCOSE, 1 HOUR GESTATIONAL: GESTATIONAL DIABETES SCREEN: 163 mg/dL — AB (ref 65–139)

## 2016-12-23 NOTE — Telephone Encounter (Signed)
Patient requesting refill of Methyldopa to [facility].  

## 2016-12-23 NOTE — Telephone Encounter (Signed)
-----   Message from Hildred LaserAnika Cherry, MD sent at 12/23/2016  1:09 PM EDT ----- Please inform of anemia, needs to begin a daily iron supplement.  Also needs 3 hr glucola for abnormal 1 hr testing

## 2016-12-23 NOTE — Telephone Encounter (Signed)
Called pt no answer. LM for pt informing her of the need for iron supplement and 3 hr gtt. Orders placed. Advised pt to call back to be scheduled.

## 2016-12-24 ENCOUNTER — Other Ambulatory Visit: Payer: Self-pay

## 2016-12-24 DIAGNOSIS — O10919 Unspecified pre-existing hypertension complicating pregnancy, unspecified trimester: Secondary | ICD-10-CM

## 2016-12-24 DIAGNOSIS — O169 Unspecified maternal hypertension, unspecified trimester: Principal | ICD-10-CM

## 2016-12-24 MED ORDER — METHYLDOPA 250 MG PO TABS: 250.0000 mg | ORAL_TABLET | Freq: Three times a day (TID) | ORAL | 6 refills | Status: DC

## 2016-12-24 NOTE — Telephone Encounter (Signed)
Sent 12 on 09/01 to last until her follow up

## 2017-01-05 ENCOUNTER — Ambulatory Visit (INDEPENDENT_AMBULATORY_CARE_PROVIDER_SITE_OTHER): Payer: Medicaid Other | Admitting: Obstetrics and Gynecology

## 2017-01-05 VITALS — BP 108/75 | HR 102 | Wt 240.0 lb

## 2017-01-05 DIAGNOSIS — O10919 Unspecified pre-existing hypertension complicating pregnancy, unspecified trimester: Secondary | ICD-10-CM

## 2017-01-05 DIAGNOSIS — Z3483 Encounter for supervision of other normal pregnancy, third trimester: Secondary | ICD-10-CM

## 2017-01-05 DIAGNOSIS — O99810 Abnormal glucose complicating pregnancy: Secondary | ICD-10-CM

## 2017-01-05 DIAGNOSIS — O99210 Obesity complicating pregnancy, unspecified trimester: Secondary | ICD-10-CM

## 2017-01-05 LAB — POCT URINALYSIS DIPSTICK
BILIRUBIN UA: NEGATIVE
Blood, UA: NEGATIVE
Glucose, UA: NEGATIVE
KETONES UA: NEGATIVE
Leukocytes, UA: NEGATIVE
Nitrite, UA: NEGATIVE
PH UA: 5 (ref 5.0–8.0)
PROTEIN UA: NEGATIVE
SPEC GRAV UA: 1.02 (ref 1.010–1.025)
Urobilinogen, UA: 0.2 E.U./dL

## 2017-01-05 NOTE — Progress Notes (Signed)
ROB:  Scheduled 3 hr GTT.  HTN controlled with Aldomet.  Taking ASA.  Has ultrasound NST and next visit already scheduled. No complaints today.

## 2017-01-06 ENCOUNTER — Other Ambulatory Visit: Payer: Medicaid Other

## 2017-01-06 DIAGNOSIS — R7309 Other abnormal glucose: Secondary | ICD-10-CM

## 2017-01-07 LAB — GESTATIONAL GLUCOSE TOLERANCE
GLUCOSE 2 HOUR GTT: 141 mg/dL (ref 65–154)
GLUCOSE 3 HOUR GTT: 91 mg/dL (ref 65–139)
GLUCOSE FASTING: 89 mg/dL (ref 65–94)
Glucose, GTT - 1 Hour: 205 mg/dL — ABNORMAL HIGH (ref 65–179)

## 2017-01-08 ENCOUNTER — Telehealth: Payer: Self-pay

## 2017-01-08 NOTE — Telephone Encounter (Signed)
-----   Message from [name], MD sent at 01/07/2017  4:42 PM EDT ----- Please inform patient that she passed her glucose test, does not have diabetes.

## 2017-01-08 NOTE — Telephone Encounter (Signed)
Called pt informed her that she passed GTT. Pt gave verbal understanding.

## 2017-01-19 ENCOUNTER — Ambulatory Visit (INDEPENDENT_AMBULATORY_CARE_PROVIDER_SITE_OTHER): Payer: Medicaid Other | Admitting: Certified Nurse Midwife

## 2017-01-19 ENCOUNTER — Ambulatory Visit (INDEPENDENT_AMBULATORY_CARE_PROVIDER_SITE_OTHER): Payer: Medicaid Other

## 2017-01-19 ENCOUNTER — Ambulatory Visit (INDEPENDENT_AMBULATORY_CARE_PROVIDER_SITE_OTHER): Payer: Medicaid Other | Admitting: Obstetrics and Gynecology

## 2017-01-19 VITALS — BP 114/68 | HR 97 | Wt 240.0 lb

## 2017-01-19 DIAGNOSIS — O0993 Supervision of high risk pregnancy, unspecified, third trimester: Secondary | ICD-10-CM

## 2017-01-19 DIAGNOSIS — O99210 Obesity complicating pregnancy, unspecified trimester: Secondary | ICD-10-CM

## 2017-01-19 DIAGNOSIS — Z3493 Encounter for supervision of normal pregnancy, unspecified, third trimester: Secondary | ICD-10-CM

## 2017-01-19 DIAGNOSIS — O10919 Unspecified pre-existing hypertension complicating pregnancy, unspecified trimester: Secondary | ICD-10-CM | POA: Diagnosis not present

## 2017-01-19 DIAGNOSIS — Z3483 Encounter for supervision of other normal pregnancy, third trimester: Secondary | ICD-10-CM | POA: Insufficient documentation

## 2017-01-19 NOTE — Progress Notes (Addendum)
NST INTERPRETATION: G2P1 @ 32wks 4 days presents today for BPP, NST, and ROB Indications: chronic hypertension on Aldomet  Acoustic stimulator used  button    Baseline: 130's Variability: moderate Accelerations: present Decelerations: absent  Toco: occasional    Impression: reactive   Plan: Complete ROB visit with Dr. Valentino Saxon as scheduled    Doreene Burke, CNM

## 2017-01-19 NOTE — Progress Notes (Signed)
Pt is here for a routine OB visit. 

## 2017-01-19 NOTE — Progress Notes (Signed)
ROB: Doing well, no complaints.  Normal 3 hr GTT (only 1 value elevated). S/p normal growth scan, EFW 66%ile, normal fluid.  NST performed today was reviewed and was found to be reactive.  Continue recommended antenatal testing and prenatal care.  Revisited contraception discussion as patient not so sure of OCPs.  Given handout regarding options, to continue discussion during pregnancy.

## 2017-01-20 NOTE — Patient Instructions (Signed)

## 2017-01-22 ENCOUNTER — Other Ambulatory Visit: Payer: Medicaid Other

## 2017-01-22 ENCOUNTER — Encounter: Payer: Medicaid Other | Admitting: Obstetrics and Gynecology

## 2017-01-26 ENCOUNTER — Ambulatory Visit (INDEPENDENT_AMBULATORY_CARE_PROVIDER_SITE_OTHER): Payer: Medicaid Other | Admitting: Obstetrics and Gynecology

## 2017-01-26 VITALS — BP 133/81 | HR 88 | Wt 240.9 lb

## 2017-01-26 DIAGNOSIS — O10013 Pre-existing essential hypertension complicating pregnancy, third trimester: Secondary | ICD-10-CM

## 2017-01-26 DIAGNOSIS — O0993 Supervision of high risk pregnancy, unspecified, third trimester: Secondary | ICD-10-CM

## 2017-01-26 DIAGNOSIS — O288 Other abnormal findings on antenatal screening of mother: Secondary | ICD-10-CM | POA: Diagnosis not present

## 2017-01-26 LAB — POCT URINALYSIS DIPSTICK
Bilirubin, UA: NEGATIVE
Blood, UA: NEGATIVE
Glucose, UA: NEGATIVE
KETONES UA: NEGATIVE
Leukocytes, UA: NEGATIVE
Nitrite, UA: NEGATIVE
PH UA: 5 (ref 5.0–8.0)
PROTEIN UA: NEGATIVE
SPEC GRAV UA: 1.025 (ref 1.010–1.025)
Urobilinogen, UA: 0.2 E.U./dL

## 2017-01-26 NOTE — Progress Notes (Signed)
Lori Gross is a G2 P1001 @ 33 wk. 4 days that presents today for NST   Indications: chronic hypertension  NST INTERPRETATION: Baseline: 130 Accelerations: present Decelerations: absent Variability : moderate  Toco: no contractions present  Impression: reactive   Plan:Follow up as scheduled with MD.     Doreene Burke, CNM

## 2017-01-26 NOTE — Patient Instructions (Signed)
Nonstress Test The nonstress test is a procedure that monitors the fetus's heartbeat. The test will monitor the heartbeat when the fetus is at rest and while the fetus is moving. In a healthy fetus, there will be an increase in fetal heart rate when the fetus moves or kicks. The heart rate will decrease at rest. This test helps determine if the fetus is healthy. Your health care provider will look at a number of patterns in the heart rate tracing to make sure your baby is thriving. If there is concern, your health care provider may order additional tests or may suggest another course of action. This test is often done in the third trimester and can help determine if an early delivery is needed and safe. Common reasons to have this test are:  You are past your due date.  You have a high-risk pregnancy.  You are feeling less movement than normal.  You have lost a pregnancy in the past.  Your health care provider suspects fetal growth problems.  You have too much or too little amniotic fluid.  What happens before the procedure?  Eat a meal right before the test or as directed by your health care provider. Food may help stimulate fetal movements.  Use the restroom right before the test. What happens during the procedure?  Two belts will be placed around your abdomen. These belts have monitors attached to them. One records the fetal heart rate and the other records uterine contractions.  You may be asked to lie down on your side or to stay sitting upright.  You may be given a button to press when you feel movement.  The fetal heartbeat is listened to and watched on a screen. The heartbeat is recorded on a sheet of paper.  If the fetus seems to be sleeping, you may be asked to drink some juice or soda, gently press your abdomen, or make some noise to wake the fetus. What happens after the procedure? Your health care provider will discuss the test results with you and make recommendations  for the near future.  This information is not intended to replace advice given to you by your health care provider. Make sure you discuss any questions you have with your health care provider. This information is not intended to replace advice given to you by your health care provider. Make sure you discuss any questions you have with your health care provider. Document Released: 03/27/2002 Document Revised: 03/06/2016 Document Reviewed: 05/10/2012 Elsevier Interactive Patient Education  2018 Elsevier Inc[employer]  

## 2017-01-29 ENCOUNTER — Other Ambulatory Visit: Payer: Medicaid Other

## 2017-01-29 ENCOUNTER — Ambulatory Visit (INDEPENDENT_AMBULATORY_CARE_PROVIDER_SITE_OTHER): Payer: Medicaid Other | Admitting: Obstetrics and Gynecology

## 2017-01-29 VITALS — BP 137/86 | HR 109 | Wt 242.5 lb

## 2017-01-29 DIAGNOSIS — O0993 Supervision of high risk pregnancy, unspecified, third trimester: Secondary | ICD-10-CM | POA: Diagnosis not present

## 2017-01-29 DIAGNOSIS — O10919 Unspecified pre-existing hypertension complicating pregnancy, unspecified trimester: Secondary | ICD-10-CM

## 2017-01-29 LAB — POCT URINALYSIS DIPSTICK
BILIRUBIN UA: NEGATIVE
Blood, UA: NEGATIVE
GLUCOSE UA: NEGATIVE
LEUKOCYTES UA: NEGATIVE
Nitrite, UA: NEGATIVE
PROTEIN UA: NEGATIVE
Spec Grav, UA: 1.02 (ref 1.010–1.025)
Urobilinogen, UA: 0.2 E.U./dL
pH, UA: 5 (ref 5.0–8.0)

## 2017-01-29 NOTE — Progress Notes (Signed)
NONSTRESS TEST INTERPRETATION  INDICATIONS: hypertension FHR baseline: 145 RESULTS:  reactive COMMENTS: very active baby  PLAN: 1. Continue fetal kick counts as directed. 2. Continue antepartum testing as scheduled.  [name], M.D. 01/29/2017 9:23 AM

## 2017-01-29 NOTE — Addendum Note (Signed)
Addended by: Brooke Dare on: 01/29/2017 10:11 AM   Modules accepted: Orders

## 2017-02-02 ENCOUNTER — Ambulatory Visit (INDEPENDENT_AMBULATORY_CARE_PROVIDER_SITE_OTHER): Payer: Medicaid Other | Admitting: Obstetrics and Gynecology

## 2017-02-02 ENCOUNTER — Other Ambulatory Visit: Payer: Medicaid Other

## 2017-02-02 VITALS — BP 126/94 | HR 94 | Wt 242.2 lb

## 2017-02-02 DIAGNOSIS — O0993 Supervision of high risk pregnancy, unspecified, third trimester: Secondary | ICD-10-CM

## 2017-02-02 DIAGNOSIS — O99210 Obesity complicating pregnancy, unspecified trimester: Secondary | ICD-10-CM

## 2017-02-02 DIAGNOSIS — O10919 Unspecified pre-existing hypertension complicating pregnancy, unspecified trimester: Secondary | ICD-10-CM

## 2017-02-02 LAB — POCT URINALYSIS DIPSTICK
Bilirubin, UA: NEGATIVE
Blood, UA: NEGATIVE
Glucose, UA: NEGATIVE
KETONES UA: NEGATIVE
LEUKOCYTES UA: NEGATIVE
NITRITE UA: NEGATIVE
PH UA: 6 (ref 5.0–8.0)
PROTEIN UA: NEGATIVE
Spec Grav, UA: 1.02 (ref 1.010–1.025)
UROBILINOGEN UA: 0.2 U/dL

## 2017-02-02 NOTE — Progress Notes (Signed)
ROB:  Taking aldomet ASA and PNV.  HTN mostly controlled.  Growth = 61%.  Continue bi-wkly NST with BP checks.    NONSTRESS TEST INTERPRETATION  INDICATIONS: chronic hypertension FHR baseline: 145 RESULTS:  reactive   PLAN: 1. Continue fetal kick counts as directed. 2. Continue antepartum testing as scheduled.  [name], M.D. 02/02/2017 9:27 AM

## 2017-02-05 ENCOUNTER — Other Ambulatory Visit: Payer: Medicaid Other

## 2017-02-05 ENCOUNTER — Ambulatory Visit (INDEPENDENT_AMBULATORY_CARE_PROVIDER_SITE_OTHER): Payer: Medicaid Other | Admitting: Obstetrics and Gynecology

## 2017-02-05 VITALS — BP 144/84 | HR 94 | Wt 244.4 lb

## 2017-02-05 DIAGNOSIS — O0993 Supervision of high risk pregnancy, unspecified, third trimester: Secondary | ICD-10-CM

## 2017-02-05 NOTE — Progress Notes (Signed)
NONSTRESS TEST INTERPRETATION  INDICATIONS: chronic htn  FHR baseline: 130 RESULTS: reactive COMMENTS: B/P-145/72, P-93   PLAN: 1. Continue fetal kick counts twice a day. 2. Continue antepartum testing as scheduled-Biweekly 3. RTO as scheduled  Fenton Mallingebbie Hanif Radin, LPN

## 2017-02-09 ENCOUNTER — Other Ambulatory Visit: Payer: Medicaid Other

## 2017-02-09 ENCOUNTER — Encounter: Payer: Medicaid Other | Admitting: Obstetrics and Gynecology

## 2017-02-12 ENCOUNTER — Ambulatory Visit (INDEPENDENT_AMBULATORY_CARE_PROVIDER_SITE_OTHER): Payer: Medicaid Other | Admitting: Obstetrics and Gynecology

## 2017-02-12 ENCOUNTER — Other Ambulatory Visit: Payer: Self-pay | Admitting: Obstetrics and Gynecology

## 2017-02-12 ENCOUNTER — Other Ambulatory Visit: Payer: Medicaid Other

## 2017-02-12 VITALS — BP 126/84 | HR 96 | Wt 246.3 lb

## 2017-02-12 DIAGNOSIS — O0993 Supervision of high risk pregnancy, unspecified, third trimester: Secondary | ICD-10-CM | POA: Diagnosis not present

## 2017-02-12 DIAGNOSIS — O10919 Unspecified pre-existing hypertension complicating pregnancy, unspecified trimester: Secondary | ICD-10-CM

## 2017-02-12 LAB — POCT URINALYSIS DIPSTICK
Bilirubin, UA: NEGATIVE
Blood, UA: NEGATIVE
Glucose, UA: NEGATIVE
Ketones, UA: NEGATIVE
LEUKOCYTES UA: NEGATIVE
NITRITE UA: NEGATIVE
PH UA: 5 (ref 5.0–8.0)
PROTEIN UA: NEGATIVE
Spec Grav, UA: 1.02 (ref 1.010–1.025)
UROBILINOGEN UA: 0.2 U/dL

## 2017-02-12 NOTE — Progress Notes (Signed)
NONSTRESS TEST INTERPRETATION  INDICATIONS: Chronic hypertension,obesity  FHR baseline: 125 RESULTS:Reactive COMMENTS:    PLAN: 1. Continue fetal kick counts twice a day. 2. Continue antepartum testing as scheduled

## 2017-02-16 ENCOUNTER — Ambulatory Visit (INDEPENDENT_AMBULATORY_CARE_PROVIDER_SITE_OTHER): Payer: Medicaid Other | Admitting: Obstetrics and Gynecology

## 2017-02-16 ENCOUNTER — Other Ambulatory Visit: Payer: Medicaid Other

## 2017-02-16 ENCOUNTER — Encounter: Payer: Self-pay | Admitting: Obstetrics and Gynecology

## 2017-02-16 ENCOUNTER — Ambulatory Visit (INDEPENDENT_AMBULATORY_CARE_PROVIDER_SITE_OTHER): Payer: Medicaid Other

## 2017-02-16 VITALS — BP 130/84 | HR 108 | Wt 246.2 lb

## 2017-02-16 DIAGNOSIS — E669 Obesity, unspecified: Secondary | ICD-10-CM

## 2017-02-16 DIAGNOSIS — O10919 Unspecified pre-existing hypertension complicating pregnancy, unspecified trimester: Secondary | ICD-10-CM | POA: Diagnosis not present

## 2017-02-16 DIAGNOSIS — O0993 Supervision of high risk pregnancy, unspecified, third trimester: Secondary | ICD-10-CM | POA: Diagnosis not present

## 2017-02-16 DIAGNOSIS — I10 Essential (primary) hypertension: Secondary | ICD-10-CM

## 2017-02-16 LAB — POCT URINALYSIS DIPSTICK
Bilirubin, UA: NEGATIVE
Glucose, UA: NEGATIVE
KETONES UA: NEGATIVE
LEUKOCYTES UA: NEGATIVE
Nitrite, UA: NEGATIVE
PH UA: 6 (ref 5.0–8.0)
PROTEIN UA: NEGATIVE
RBC UA: NEGATIVE
Urobilinogen, UA: 0.2 E.U./dL

## 2017-02-16 NOTE — Progress Notes (Signed)
ROB: Doing well, notes some pelvic pressure.  36 week labs done today.  Discussed IOL for h/o cHTN on Aldomet. Recommend delivery between 38-39 weeks.  Patient would like to have IOL scheduled on 02/26/2017.  Will schedule. NST performed today was reviewed and was found to be reactive.  Continue recommended antenatal testing and prenatal care.   NONSTRESS TEST INTERPRETATION  INDICATIONS: Chronic hypertension and Obesity  FHR baseline: 135 bpm RESULTS:Reactive COMMENTS: infrequent contractions (undetectable by patient)   PLAN: 1. Continue fetal kick counts twice a day. 2. Continue antepartum testing as scheduled-Biweekly

## 2017-02-16 NOTE — Progress Notes (Deleted)
Body mass index is 42.26 kg/m.

## 2017-02-16 NOTE — Progress Notes (Signed)
ROB- Pt states she has been doing well, just has had some leg cramps in her left leg

## 2017-02-18 LAB — STREP GP B NAA: Strep Gp B NAA: NEGATIVE

## 2017-02-18 LAB — GC/CHLAMYDIA PROBE AMP
Chlamydia trachomatis, NAA: NEGATIVE
Neisseria gonorrhoeae by PCR: NEGATIVE

## 2017-02-19 ENCOUNTER — Other Ambulatory Visit: Payer: Medicaid Other

## 2017-02-19 ENCOUNTER — Ambulatory Visit (INDEPENDENT_AMBULATORY_CARE_PROVIDER_SITE_OTHER): Payer: Medicaid Other | Admitting: Obstetrics and Gynecology

## 2017-02-19 VITALS — BP 147/72 | HR 80 | Wt 245.0 lb

## 2017-02-19 DIAGNOSIS — O0993 Supervision of high risk pregnancy, unspecified, third trimester: Secondary | ICD-10-CM | POA: Diagnosis not present

## 2017-02-19 DIAGNOSIS — I10 Essential (primary) hypertension: Secondary | ICD-10-CM

## 2017-02-19 LAB — POCT URINALYSIS DIPSTICK
BILIRUBIN UA: NEGATIVE
GLUCOSE UA: NEGATIVE
Ketones, UA: NEGATIVE
NITRITE UA: NEGATIVE
RBC UA: NEGATIVE
SPEC GRAV UA: 1.025 (ref 1.010–1.025)
Urobilinogen, UA: 0.2 E.U./dL
pH, UA: 6 (ref 5.0–8.0)

## 2017-02-19 NOTE — Progress Notes (Signed)
NST performed today was reviewed and was found to be reactive.  Continue recommended antenatal testing and prenatal care.   NONSTRESS TEST INTERPRETATION  INDICATIONS: Chronic hypertension and Obesity  FHR baseline: 130 bpm RESULTS:Reactive COMMENTS: Contractions q 4-6 minutes, mildly detectable by patient.    PLAN: 1. Continue fetal kick counts twice a day. 2. Continue antepartum testing as scheduled-Biweekly 3. Labor precautions given. Scheduled for IOL on 02/26/2017.    Hildred Laserherry, Sharena Dibenedetto, MD [facility]

## 2017-02-23 ENCOUNTER — Ambulatory Visit (INDEPENDENT_AMBULATORY_CARE_PROVIDER_SITE_OTHER): Payer: Medicaid Other | Admitting: Obstetrics and Gynecology

## 2017-02-23 ENCOUNTER — Encounter: Payer: Self-pay | Admitting: Obstetrics and Gynecology

## 2017-02-23 VITALS — BP 136/90 | HR 101 | Wt 249.4 lb

## 2017-02-23 DIAGNOSIS — Z3483 Encounter for supervision of other normal pregnancy, third trimester: Secondary | ICD-10-CM

## 2017-02-23 DIAGNOSIS — O10013 Pre-existing essential hypertension complicating pregnancy, third trimester: Secondary | ICD-10-CM

## 2017-02-23 DIAGNOSIS — E669 Obesity, unspecified: Secondary | ICD-10-CM

## 2017-02-23 DIAGNOSIS — Z3689 Encounter for other specified antenatal screening: Secondary | ICD-10-CM

## 2017-02-23 DIAGNOSIS — O10919 Unspecified pre-existing hypertension complicating pregnancy, unspecified trimester: Secondary | ICD-10-CM

## 2017-02-23 DIAGNOSIS — O0993 Supervision of high risk pregnancy, unspecified, third trimester: Secondary | ICD-10-CM

## 2017-02-23 LAB — POCT URINALYSIS DIPSTICK
Bilirubin, UA: NEGATIVE
GLUCOSE UA: NEGATIVE
Ketones, UA: NEGATIVE
Leukocytes, UA: NEGATIVE
Nitrite, UA: NEGATIVE
Protein, UA: NEGATIVE
RBC UA: NEGATIVE
SPEC GRAV UA: 1.02 (ref 1.010–1.025)
UROBILINOGEN UA: 0.2 U/dL
pH, UA: 6.5 (ref 5.0–8.0)

## 2017-02-23 NOTE — Progress Notes (Signed)
Vashti HeyKimberly A Broadnax 21 yr old G2 P1001 presents today for NST @ 3173w4d  Fetus A Non-Stress Test Interpretation for 02/23/17  Indication: Chronic Hypertenstion  BP on arrival: 147/86 Repeat:    Baseline: 130  Variability: moderate  Acceleration: present   Decelerations: absent  Toco: occasional mild    10 x 10 with acoustic stimulation , She had 2 qualifying 15 x 15 accelerations. NST: Reactive  Doreene BurkeAnnie Aarsh Fristoe, CNM

## 2017-02-23 NOTE — Patient Instructions (Signed)
Nonstress Test The nonstress test is a procedure that monitors the fetus's heartbeat. The test will monitor the heartbeat when the fetus is at rest and while the fetus is moving. In a healthy fetus, there will be an increase in fetal heart rate when the fetus moves or kicks. The heart rate will decrease at rest. This test helps determine if the fetus is healthy. Your health care provider will look at a number of patterns in the heart rate tracing to make sure your baby is thriving. If there is concern, your health care provider may order additional tests or may suggest another course of action. This test is often done in the third trimester and can help determine if an early delivery is needed and safe. Common reasons to have this test are:  You are past your due date.  You have a high-risk pregnancy.  You are feeling less movement than normal.  You have lost a pregnancy in the past.  Your health care provider suspects fetal growth problems.  You have too much or too little amniotic fluid.  What happens before the procedure?  Eat a meal right before the test or as directed by your health care provider. Food may help stimulate fetal movements.  Use the restroom right before the test. What happens during the procedure?  Two belts will be placed around your abdomen. These belts have monitors attached to them. One records the fetal heart rate and the other records uterine contractions.  You may be asked to lie down on your side or to stay sitting upright.  You may be given a button to press when you feel movement.  The fetal heartbeat is listened to and watched on a screen. The heartbeat is recorded on a sheet of paper.  If the fetus seems to be sleeping, you may be asked to drink some juice or soda, gently press your abdomen, or make some noise to wake the fetus. What happens after the procedure? Your health care provider will discuss the test results with you and make recommendations  for the near future.  This information is not intended to replace advice given to you by your health care provider. Make sure you discuss any questions you have with your health care provider. This information is not intended to replace advice given to you by your health care provider. Make sure you discuss any questions you have with your health care provider. Document Released: 03/27/2002 Document Revised: 03/06/2016 Document Reviewed: 05/10/2012 Elsevier Interactive Patient Education  2018 Elsevier Inc[employer]  

## 2017-02-23 NOTE — Progress Notes (Signed)
ROB: Occasional but rare contractions.  NST today reactive.  Induction planned Friday for chronic hypertension.  Discussed Cytotec and induction methods in detail.

## 2017-02-26 ENCOUNTER — Inpatient Hospital Stay: Payer: Medicaid Other | Admitting: Anesthesiology

## 2017-02-26 ENCOUNTER — Other Ambulatory Visit: Payer: Self-pay

## 2017-02-26 ENCOUNTER — Inpatient Hospital Stay
Admission: EM | Admit: 2017-02-26 | Discharge: 2017-02-28 | DRG: 806 | Disposition: A | Discharge status: Home / Self Care | Payer: Medicaid Other | Attending: Obstetrics and Gynecology | Admitting: Obstetrics and Gynecology

## 2017-02-26 DIAGNOSIS — O1002 Pre-existing essential hypertension complicating childbirth: Secondary | ICD-10-CM | POA: Diagnosis present

## 2017-02-26 DIAGNOSIS — O99324 Drug use complicating childbirth: Secondary | ICD-10-CM | POA: Diagnosis present

## 2017-02-26 DIAGNOSIS — F1290 Cannabis use, unspecified, uncomplicated: Secondary | ICD-10-CM | POA: Diagnosis present

## 2017-02-26 DIAGNOSIS — Z7982 Long term (current) use of aspirin: Secondary | ICD-10-CM | POA: Diagnosis not present

## 2017-02-26 DIAGNOSIS — Z3A38 38 weeks gestation of pregnancy: Secondary | ICD-10-CM

## 2017-02-26 DIAGNOSIS — Z3490 Encounter for supervision of normal pregnancy, unspecified, unspecified trimester: Secondary | ICD-10-CM

## 2017-02-26 LAB — URINALYSIS, ROUTINE W REFLEX MICROSCOPIC
Bacteria, UA: NONE SEEN
Bilirubin Urine: NEGATIVE
GLUCOSE, UA: NEGATIVE mg/dL
HGB URINE DIPSTICK: NEGATIVE
KETONES UR: 5 mg/dL — AB
NITRITE: NEGATIVE
PH: 5 (ref 5.0–8.0)
PROTEIN: 30 mg/dL — AB
Specific Gravity, Urine: 1.02 (ref 1.005–1.030)

## 2017-02-26 LAB — CHLAMYDIA/NGC RT PCR (ARMC ONLY)
CHLAMYDIA TR: NOT DETECTED
N GONORRHOEAE: NOT DETECTED

## 2017-02-26 LAB — CBC
HEMATOCRIT: 31.4 % — AB (ref 35.0–47.0)
Hemoglobin: 10.5 g/dL — ABNORMAL LOW (ref 12.0–16.0)
MCH: 33.3 pg (ref 26.0–34.0)
MCHC: 33.5 g/dL (ref 32.0–36.0)
MCV: 99.4 fL (ref 80.0–100.0)
Platelets: 284 10*3/uL (ref 150–440)
RBC: 3.16 MIL/uL — ABNORMAL LOW (ref 3.80–5.20)
RDW: 16 % — AB (ref 11.5–14.5)
WBC: 10.9 10*3/uL (ref 3.6–11.0)

## 2017-02-26 LAB — PROTEIN / CREATININE RATIO, URINE
Creatinine, Urine: 197 mg/dL
PROTEIN CREATININE RATIO: 0.07 mg/mg{creat} (ref 0.00–0.15)
TOTAL PROTEIN, URINE: 14 mg/dL

## 2017-02-26 LAB — TYPE AND SCREEN
ABO/RH(D): A NEG
ANTIBODY SCREEN: NEGATIVE

## 2017-02-26 MED ORDER — PHENYLEPHRINE 40 MCG/ML (10ML) SYRINGE FOR IV PUSH (FOR BLOOD PRESSURE SUPPORT)
80.0000 ug | PREFILLED_SYRINGE | INTRAVENOUS | Status: DC | PRN
  Filled 2017-02-26: qty 5

## 2017-02-26 MED ORDER — OXYTOCIN BOLUS FROM INFUSION
500.0000 mL | Freq: Once | INTRAVENOUS | Status: AC
  Administered 2017-02-26: 500 mL via INTRAVENOUS

## 2017-02-26 MED ORDER — ACETAMINOPHEN 325 MG PO TABS: 650.0000 mg | ORAL_TABLET | ORAL | Status: DC | PRN

## 2017-02-26 MED ORDER — OXYTOCIN 40 UNITS IN LACTATED RINGERS INFUSION - SIMPLE MED: 2.5000 [IU]/h | INTRAVENOUS | Status: DC

## 2017-02-26 MED ORDER — BUTORPHANOL TARTRATE 2 MG/ML IJ SOLN
1.0000 mg | Freq: Once | INTRAMUSCULAR | Status: AC
  Administered 2017-02-26: 1 mg via INTRAVENOUS

## 2017-02-26 MED ORDER — MISOPROSTOL 200 MCG PO TABS
ORAL_TABLET | ORAL | Status: AC
  Administered 2017-02-26: 25 ug via VAGINAL
  Filled 2017-02-26: qty 4

## 2017-02-26 MED ORDER — LACTATED RINGERS IV SOLN: 500.0000 mL | Freq: Once | INTRAVENOUS | Status: DC

## 2017-02-26 MED ORDER — HYDRALAZINE HCL 20 MG/ML IJ SOLN: 10.0000 mg | Freq: Once | INTRAMUSCULAR | Status: DC | PRN

## 2017-02-26 MED ORDER — LIDOCAINE HCL (PF) 1 % IJ SOLN
INTRAMUSCULAR | Status: AC
  Filled 2017-02-26: qty 30

## 2017-02-26 MED ORDER — LACTATED RINGERS IV SOLN: INTRAVENOUS | Status: DC

## 2017-02-26 MED ORDER — FENTANYL 2.5 MCG/ML W/ROPIVACAINE 0.15% IN NS 100 ML EPIDURAL (ARMC)
12.0000 mL/h | EPIDURAL | Status: DC
  Administered 2017-02-26: 10 mL/h via EPIDURAL

## 2017-02-26 MED ORDER — LABETALOL HCL 5 MG/ML IV SOLN
20.0000 mg | INTRAVENOUS | Status: DC | PRN
  Administered 2017-02-26 (×2): 20 mg via INTRAVENOUS
  Filled 2017-02-26: qty 16
  Filled 2017-02-26: qty 4
  Filled 2017-02-26: qty 8

## 2017-02-26 MED ORDER — MISOPROSTOL 25 MCG QUARTER TABLET
ORAL_TABLET | ORAL | Status: AC
  Filled 2017-02-26: qty 1

## 2017-02-26 MED ORDER — OXYTOCIN 40 UNITS IN LACTATED RINGERS INFUSION - SIMPLE MED
INTRAVENOUS | Status: AC
  Filled 2017-02-26: qty 1000

## 2017-02-26 MED ORDER — SOD CITRATE-CITRIC ACID 500-334 MG/5ML PO SOLN: 30.0000 mL | ORAL | Status: DC | PRN

## 2017-02-26 MED ORDER — OXYCODONE-ACETAMINOPHEN 5-325 MG PO TABS: 1.0000 | ORAL_TABLET | ORAL | Status: DC | PRN

## 2017-02-26 MED ORDER — FENTANYL 2.5 MCG/ML W/ROPIVACAINE 0.15% IN NS 100 ML EPIDURAL (ARMC)
EPIDURAL | Status: AC
  Filled 2017-02-26: qty 100

## 2017-02-26 MED ORDER — MISOPROSTOL 50MCG HALF TABLET
ORAL_TABLET | ORAL | Status: AC
  Administered 2017-02-26: 50 ug via VAGINAL
  Filled 2017-02-26: qty 1

## 2017-02-26 MED ORDER — LACTATED RINGERS IV SOLN: 500.0000 mL | INTRAVENOUS | Status: DC | PRN

## 2017-02-26 MED ORDER — IBUPROFEN 600 MG PO TABS
ORAL_TABLET | ORAL | Status: AC
Start: 2017-02-26 — End: 2017-02-26
  Administered 2017-02-26: 600 mg via ORAL
  Filled 2017-02-26: qty 1

## 2017-02-26 MED ORDER — OXYTOCIN 40 UNITS IN LACTATED RINGERS INFUSION - SIMPLE MED
INTRAVENOUS | Status: AC
Start: 2017-02-26 — End: 2017-02-26
  Administered 2017-02-26: 500 mL via INTRAVENOUS
  Filled 2017-02-26: qty 1000

## 2017-02-26 MED ORDER — EPHEDRINE 5 MG/ML INJ
10.0000 mg | INTRAVENOUS | Status: DC | PRN
  Filled 2017-02-26: qty 2

## 2017-02-26 MED ORDER — IBUPROFEN 600 MG PO TABS
600.0000 mg | ORAL_TABLET | Freq: Four times a day (QID) | ORAL | Status: DC
  Administered 2017-02-26 – 2017-02-28 (×7): 600 mg via ORAL
  Filled 2017-02-26 (×6): qty 1

## 2017-02-26 MED ORDER — BUTORPHANOL TARTRATE 2 MG/ML IJ SOLN
INTRAMUSCULAR | Status: AC
Start: 2017-02-26 — End: 2017-02-27
  Filled 2017-02-26: qty 1

## 2017-02-26 MED ORDER — MISOPROSTOL 25 MCG QUARTER TABLET
50.0000 ug | ORAL_TABLET | ORAL | Status: DC
  Administered 2017-02-26: 25 ug via VAGINAL
  Administered 2017-02-26 (×2): 50 ug via VAGINAL
  Filled 2017-02-26 (×2): qty 1

## 2017-02-26 MED ORDER — CLINDAMYCIN PHOSPHATE 900 MG/50ML IV SOLN: 900.0000 mg | Freq: Once | INTRAVENOUS | Status: DC

## 2017-02-26 MED ORDER — PRENATAL MULTIVITAMIN CH
1.0000 | ORAL_TABLET | Freq: Every day | ORAL | Status: DC
  Administered 2017-02-27 – 2017-02-28 (×2): 1 via ORAL
  Filled 2017-02-26 (×2): qty 1

## 2017-02-26 MED ORDER — BENZOCAINE-MENTHOL 20-0.5 % EX AERO
1.0000 "application " | INHALATION_SPRAY | CUTANEOUS | Status: DC | PRN
  Filled 2017-02-26: qty 56

## 2017-02-26 MED ORDER — LABETALOL HCL 5 MG/ML IV SOLN
INTRAVENOUS | Status: AC
  Administered 2017-02-26: 20 mg via INTRAVENOUS
  Filled 2017-02-26: qty 4

## 2017-02-26 MED ORDER — TETANUS-DIPHTH-ACELL PERTUSSIS 5-2.5-18.5 LF-MCG/0.5 IM SUSP: 0.5000 mL | Freq: Once | INTRAMUSCULAR | Status: DC

## 2017-02-26 MED ORDER — BUPIVACAINE HCL (PF) 0.25 % IJ SOLN
INTRAMUSCULAR | Status: DC | PRN
  Administered 2017-02-26: 5 mL via EPIDURAL

## 2017-02-26 MED ORDER — DOCUSATE SODIUM 100 MG PO CAPS
100.0000 mg | ORAL_CAPSULE | Freq: Two times a day (BID) | ORAL | Status: DC
  Administered 2017-02-27 – 2017-02-28 (×2): 100 mg via ORAL
  Filled 2017-02-26 (×2): qty 1

## 2017-02-26 MED ORDER — OXYTOCIN 10 UNIT/ML IJ SOLN
INTRAMUSCULAR | Status: AC
  Filled 2017-02-26: qty 2

## 2017-02-26 MED ORDER — OXYTOCIN 40 UNITS IN LACTATED RINGERS INFUSION - SIMPLE MED
2.5000 [IU]/h | INTRAVENOUS | Status: DC | PRN
  Filled 2017-02-26: qty 1000

## 2017-02-26 MED ORDER — DIPHENHYDRAMINE HCL 50 MG/ML IJ SOLN: 12.5000 mg | INTRAMUSCULAR | Status: DC | PRN

## 2017-02-26 MED ORDER — AMMONIA AROMATIC IN INHA
RESPIRATORY_TRACT | Status: AC
  Filled 2017-02-26: qty 10

## 2017-02-26 MED ORDER — DIPHENHYDRAMINE HCL 25 MG PO CAPS: 25.0000 mg | ORAL_CAPSULE | Freq: Four times a day (QID) | ORAL | Status: DC | PRN

## 2017-02-26 MED ORDER — SIMETHICONE 80 MG PO CHEW: 80.0000 mg | CHEWABLE_TABLET | ORAL | Status: DC | PRN

## 2017-02-26 MED ORDER — LIDOCAINE HCL (PF) 1 % IJ SOLN: 30.0000 mL | INTRAMUSCULAR | Status: DC | PRN

## 2017-02-26 MED ORDER — SODIUM CHLORIDE 0.9 % IV SOLN: 2.0000 g | INTRAVENOUS | Status: AC

## 2017-02-26 MED ORDER — ZOLPIDEM TARTRATE 5 MG PO TABS: 5.0000 mg | ORAL_TABLET | Freq: Every evening | ORAL | Status: DC | PRN

## 2017-02-26 MED ORDER — LACTATED RINGERS IV SOLN
INTRAVENOUS | Status: DC
  Administered 2017-02-26: 07:00:00 via INTRAVENOUS

## 2017-02-26 NOTE — Progress Notes (Signed)
LABOR NOTE   Lori Gross 21 y.o.GP@ at 1972w0d   SUBJECTIVE:   OBJECTIVE:  BP (!) 147/84   Pulse 97   Temp (!) 97.5 F (36.4 C) (Oral)   Resp 18   Ht 5\' 4"  (1.626 m)   Wt 249 lb (112.9 kg)   LMP 05/29/2016 (Approximate)   BMI 42.74 kg/m  No intake/output data recorded.   CERVIX: 1 cm:  Long:    SVE:   Dilation: 1 Exam by:: Dr. Logan BoresEvans CONTRACTIONS: irregular, every 8 minutes FHR: Fetal heart tracing reviewed. Variability: Good {> 6 bpm) Category I   50mcg misoprostol placed  Labs: Lab Results  Component Value Date   WBC 10.9 02/26/2017   HGB 10.5 (L) 02/26/2017   HCT 31.4 (L) 02/26/2017   MCV 99.4 02/26/2017   PLT 284 02/26/2017    ASSESSMENT: 1) starting induction for chronic HTN        PLAN: Contractions with Misoprosotol.  Elonda Huskyavid J. Denine Brotz, M.D. 02/26/2017 8:20 AM

## 2017-02-26 NOTE — H&P (Signed)
History and Physical   HPI  Lori Gross is a 21 y.o. G2P1001 at 3475w0d Estimated Date of Delivery: 03/12/17 who is being admitted for  induction of labor for chronic hypertension controlled on Aldomet.   OB History  Obstetric History   G2   P1   T1   P0   A0   L1    SAB0   TAB0   Ectopic0   Multiple0   Live Births1     # Outcome Date GA Lbr Len/2nd Weight Sex Delivery Anes PTL Lv  2 Current           1 Term 11/08/15 3819w0d / 01:44 6 lb 13 oz (3.09 kg) F Vag-Spont EPI  LIV     Name: Sickman,PENDINGBABY     Complications: Chronic hypertension affecting pregnancy     Apgar1:  7                Apgar5: 9    Obstetric Comments  Menstrual age: [age]    Age 1st Pregnancy: 89    PROBLEM LIST  Pregnancy complications or risks: Patient Active Problem List   Diagnosis Date Noted  . Pregnancy 02/26/2017  . Supervision of normal intrauterine pregnancy in multigravida in third trimester 01/19/2017  . Low-lying placenta 10/27/2016  . Hypertension, benign 11/20/2015  . Dysmenorrhea 10/08/2014  . 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 Microalbuminuria 10/08/2014  . Obesity (BMI 30.0-34.9) 10/08/2014  . Migraine without aura and responsive to treatment 10/08/2014    Prenatal labs and studies: ABO, Rh: --/--/A NEG (11/09 0708) Antibody: NEG (11/09 0708) Rubella: 2.56 (04/02 1544) RPR: Non Reactive (04/02 1544)  HBsAg: Negative (04/02 1544)  HIV:    ZOX:WRUEAVWUGBS:Negative (10/30 1606)   Past Medical History:  Diagnosis Date  . Benign headache   . Dysmenorrhea   . Grieving   . Hypertension   . Marijuana use 2016   positive UDS  . Microalbuminuria   . Migraine without aura and without status migrainosus, not intractable   . Obesity   . Urticaria      Past Surgical History:  Procedure Laterality Date  . NO PAST SURGERIES       Medications      Medication List    ASK your doctor about these medications   aspirin 81 MG chewable tablet   methyldopa 250 MG  tablet Commonly known as:  ALDOMET Take 1 tablet (250 mg total) by mouth 3 (three) times daily.   PNV PRENATAL PLUS MULTIVITAMIN 27-1 MG Tabs TAKE 1 TABLET BY MOUTH DAILY.        Allergies  Patient has no known allergies.  Review of Systems  Pertinent items noted in HPI and remainder of comprehensive ROS otherwise negative.  Physical Exam  BP (!) 147/84   Pulse 97   Temp (!) 97.5 F (36.4 C) (Oral)   Resp 18   Ht 5\' 4"  (1.626 m)   Wt 249 lb (112.9 kg)   LMP 05/29/2016 (Approximate)   BMI 42.74 kg/m   Lungs:  CTA B Cardio: RRR without M/R/G Abd: Soft, gravid, NT Presentation: cephalic EXT: No C/C/ 1+ Edema DTRs: 2+ B CERVIX: 1 cm  :  Long:   -3:    mid position:    soft  See Prenatal records for more detailed PE.     FHR:  Variability: Good {> 6 bpm)  Toco: Uterine Contractions: occasionl - pt not feeling them   Test Results  Results for orders placed  or performed during the hospital encounter of 02/26/17 (from the past 24 hour(s))  CBC     Status: Abnormal   Collection Time: 02/26/17  7:08 AM  Result Value Ref Range   WBC 10.9 3.6 - 11.0 K/uL   RBC 3.16 (L) 3.80 - 5.20 MIL/uL   Hemoglobin 10.5 (L) 12.0 - 16.0 g/dL   HCT 16.131.4 (L) 09.635.0 - 04.547.0 %   MCV 99.4 80.0 - 100.0 fL   MCH 33.3 26.0 - 34.0 pg   MCHC 33.5 32.0 - 36.0 g/dL   RDW 40.916.0 (H) 81.111.5 - 91.414.5 %   Platelets 284 150 - 440 K/uL  Type and screen [facility]     Status: None   Collection Time: 02/26/17  7:08 AM  Result Value Ref Range   ABO/RH(D) A NEG    Antibody Screen NEG    Sample Expiration 03/01/2017      Assessment   G2P1001 at 2836w0d Estimated Date of Delivery: 03/12/17  The fetus is reassuring.   Patient Active Problem List   Diagnosis Date Noted  . Pregnancy 02/26/2017  . Supervision of normal intrauterine pregnancy in multigravida in third trimester 01/19/2017  . Low-lying placenta 10/27/2016  . Hypertension, benign 11/20/2015  . Dysmenorrhea  10/08/2014  . 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 Microalbuminuria 10/08/2014  . Obesity (BMI 30.0-34.9) 10/08/2014  . Migraine without aura and responsive to treatment 10/08/2014    Plan  1. Admit to L&D :    2. EFM: -- Category 1 3. Epidural if desired. Stadol for IV pain until epidural requested. 4. Admission labs  5. Cytotec 6. Antihypertensives as needed 7. Urine for protein - PIH labs if proteinuria present.  Elonda Huskyavid J. Evans, M.D. 02/26/2017 8:14 AM
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
## 2017-02-26 NOTE — Anesthesia Preprocedure Evaluation (Signed)
Anesthesia Evaluation  Patient identified by MRN, date of birth, ID band Patient awake    Reviewed: Allergy & Precautions, NPO status , Patient's Chart, lab work & pertinent test results, reviewed documented beta blocker date and time   Airway Mallampati: III  TM Distance: >3 FB     Dental  (+) Chipped   Pulmonary           Cardiovascular hypertension, Pt. on medications      Neuro/Psych  Headaches,    GI/Hepatic   Endo/Other    Renal/GU      Musculoskeletal   Abdominal   Peds  Hematology   Anesthesia Other Findings MJ use. Obese.  Reproductive/Obstetrics                             Anesthesia Physical Anesthesia Plan  ASA: III  Anesthesia Plan: Epidural   Post-op Pain Management:    Induction:   PONV Risk Score and Plan:   Airway Management Planned:   Additional Equipment:   Intra-op Plan:   Post-operative Plan:   Informed Consent: I have reviewed the patients History and Physical, chart, labs and discussed the procedure including the risks, benefits and alternatives for the proposed anesthesia with the patient or authorized representative who has indicated his/her understanding and acceptance.     Plan Discussed with: CRNA  Anesthesia Plan Comments:         Anesthesia Quick Evaluation

## 2017-02-26 NOTE — Progress Notes (Signed)
LABOR NOTE   Lori Gross 21 y.o.GP@ at 9068w0d   SUBJECTIVE:  Feeling contractions much stonger OBJECTIVE:  BP (!) 142/68 (BP Location: Right Arm)   Pulse 70   Temp 98 F (36.7 C) (Oral)   Resp 20   Ht 5\' 4"  (1.626 m)   Wt 249 lb (112.9 kg)   LMP 05/29/2016 (Approximate)   BMI 42.74 kg/m  No intake/output data recorded.  She has shown cervical change. CERVIX: 3 cm:  50%:   -3:   mid position:   soft SVE:   Dilation: 1 Exam by:: Lori Gross. Lori Bowdish, MD CONTRACTIONS: irregular, every 5 minutes FHR: Fetal heart tracing reviewed. Variability: Good {> 6 bpm) Category I   Analgesia:   AROM - clear fluid 50 mcg Misoprostol placed.  Labs: Lab Results  Component Value Date   WBC 10.9 02/26/2017   HGB 10.5 (L) 02/26/2017   HCT 31.4 (L) 02/26/2017   MCV 99.4 02/26/2017   PLT 284 02/26/2017    ASSESSMENT: 1) Labor curve reviewed.       Progress: good progress for induction     Membranes: ruptured        PLAN: 50 mcg misoprostol placed - expect active labor soon.  Elonda Huskyavid J. Shelita Steptoe, M.D. 02/26/2017 5:25 PM

## 2017-02-26 NOTE — Progress Notes (Signed)
Patient ID: Lori Gross Deuser, female   DOB: 04/08/1996, 21 y.o.   MRN: [id number]  LABOR NOTE   Lori Gross [place] 21 y.o.GP@ at 4729w0d Early latent labor.  SUBJECTIVE:  Pt still comfortable - has "cramping" OBJECTIVE:  BP (!) 144/96   Pulse 86   Temp 98.6 F (37 C) (Oral)   Resp 18   Ht 5\' 4"  (1.626 m)   Wt 249 lb (112.9 kg)   LMP 05/29/2016 (Approximate)   BMI 42.74 kg/m  No intake/output data recorded.  She has not shown cervical change. CERVIX: 1 cm:  Long:   -3:   posterior:   soft SVE:   Dilation: 1 Exam by:: Charlena Cross. Evans, MD CONTRACTIONS: irregular, every 5 minutes - mild FHR: Fetal heart tracing reviewed. Variability: Good {> 6 bpm) Category I   Analgesia:   Labs: Lab Results  Component Value Date   WBC 10.9 02/26/2017   HGB 10.5 (L) 02/26/2017   HCT 31.4 (L) 02/26/2017   MCV 99.4 02/26/2017   PLT 284 02/26/2017    ASSESSMENT: 1) Labor curve reviewed.       Progress: Early latent labor.     Membranes: intact      2)      PLAN: 50 mcg Misoprostol placed.  Elonda Huskyavid J. Evans, M.D. 02/26/2017 12:43 PM

## 2017-02-26 NOTE — Progress Notes (Signed)
Accepted care of patient this morning. BP 170/110. Treated pt with labetolol per MD orders. BP cycling Q10 minutes.

## 2017-02-26 NOTE — Anesthesia Procedure Notes (Signed)
Epidural Patient location during procedure: OB  Staffing Anesthesiologist: Bettylou Frew, MD Performed: anesthesiologist   Preanesthetic Checklist Completed: patient identified, site marked, surgical consent, pre-op evaluation, timeout performed, IV checked, risks and benefits discussed and monitors and equipment checked  Epidural Patient position: sitting Prep: Betadine Patient monitoring: heart rate, continuous pulse ox and blood pressure Approach: midline Location: L4-L5 Injection technique: LOR saline  Needle:  Needle type: Tuohy  Needle gauge: 18 G Needle length: 9 cm and 9 Catheter type: closed end flexible Catheter size: 20 Guage Test dose: negative and 1.5% lidocaine with Epi 1:200 K  Assessment Sensory level: T10 Events: blood not aspirated, injection not painful, no injection resistance, negative IV test and no paresthesia  Additional Notes   Patient tolerated the insertion well without complications.Reason for block:procedure for pain     

## 2017-02-27 LAB — RPR: RPR Ser Ql: NONREACTIVE

## 2017-02-27 MED ORDER — METHYLDOPA 250 MG PO TABS
250.0000 mg | ORAL_TABLET | Freq: Three times a day (TID) | ORAL | Status: DC
  Administered 2017-02-27 – 2017-02-28 (×4): 250 mg via ORAL
  Filled 2017-02-27 (×6): qty 1

## 2017-02-27 NOTE — Progress Notes (Deleted)
Right arm, large cuff

## 2017-02-27 NOTE — Plan of Care (Signed)
U/-1. Lochial Flow is small in amount. Pt. Is alert and oriented with aprop. Affect. Color good., skin W&D. Hx. Chronic Hypertension. B/P WNL. Methyldopa  started today. Pt. Caring for self and pain is controlled by scheduled Ibuprofen per Pt. Statement.

## 2017-02-27 NOTE — Progress Notes (Signed)
Patient has CHTN.  Morning vitals: BP-145/78 Pulse- 80 Resp-18 Temp- 98.4. No blood pressure medication currently ordered for  patient. Patient takes Methyldopa TID at home. Patient states "with breakfast, around 2pm, and with dinner." RN spoke with Dr. Logan BoresEvans. Verbal order put in for Methyldopa. RN called pharmacy for this med. Pharmacy called back and stated that they do not have this medication and it is being sent STAT from [place]. MD in department and notified. MD states to give 1400 dose. RN updated patient of situation. Patient states "ok." RN to call MD if medication does not arrive prior to 1400 dose. RN to continue to monitor patient.

## 2017-02-27 NOTE — Anesthesia Postprocedure Evaluation (Signed)
Anesthesia Post Note  Patient: Lori Gross  Procedure(s) Performed: AN AD HOC LABOR EPIDURAL  Patient location during evaluation: Mother Baby Anesthesia Type: Epidural Level of consciousness: awake and alert Pain management: pain level controlled Vital Signs Assessment: post-procedure vital signs reviewed and stable Respiratory status: spontaneous breathing, nonlabored ventilation and respiratory function stable Cardiovascular status: stable Postop Assessment: no headache, no backache and epidural receding Anesthetic complications: no     Last Vitals:  Vitals:   02/27/17 1301 02/27/17 1458  BP: 138/74 140/71  Pulse: 98 78  Resp: 17   Temp: 36.9 C 36.8 C  SpO2: 99% 98%    Last Pain:  Vitals:   02/27/17 1458  TempSrc: Oral  PainSc:                  KEPHART,WILLIAM K

## 2017-02-27 NOTE — Progress Notes (Signed)
Patient ID: Lori HeyKimberly A Uptain, female   DOB: 04/24/1995, 21 y.o.   MRN: [id number]  Progress Note - Vaginal Delivery  Lori Gross is a 21 y.o. G2P2002 now PP day 1 s/p Vaginal, Spontaneous .   Subjective:  The patient reports no complaints, up ad lib, voiding and tolerating PO  Objective:  Vital signs in last 24 hours: Temp:  [98 F (36.7 C)-98.6 F (37 C)] 98.4 F (36.9 C) (11/10 0800) Pulse Rate:  [70-149] 80 (11/10 0800) Resp:  [16-20] 18 (11/10 0800) BP: (124-173)/(66-147) 145/78 (11/10 0800) SpO2:  [95 %-99 %] 95 % (11/10 0800)  Physical Exam:  General: alert, cooperative and no distress Lochia: appropriate Uterine Fundus: firm DVT Evaluation: No evidence of DVT seen on physical exam.    Data Review Recent Labs    02/26/17 0708  HGB 10.5*  HCT 31.4*    Assessment/Plan: Active Problems:   Pregnancy   Plan for discharge tomorrow  Will re-start her Aldomet   -- Continue routine PP care.     Elonda Huskyavid J. Anouk Critzer, M.D. 02/27/2017 10:25 AM

## 2017-02-28 ENCOUNTER — Telehealth: Payer: Self-pay

## 2017-02-28 NOTE — Discharge Summary (Signed)
                              Discharge Summary  Date of Admission: 02/26/2017  Date of Discharge: 02/28/2017  Admitting Diagnosis: Induction of labor at 2320w0d for chronic HTN  Mode of Delivery: normal spontaneous vaginal delivery                 Discharge Diagnosis: Same   Intrapartum Procedures: Atificial rupture of membranes and epidural  Complications: none                      Discharge Day SOAP Note:  Progress Note - Vaginal Delivery  Lori Gross is a 21 y.o. G2P2002 now PP day 2 s/p Vaginal, Spontaneous . Delivery was uncomplicated  Subjective  The patient has the following complaints: has no unusual complaints  Pain is controlled with current medications.   Patient is urinating without difficulty.  She is ambulating well.    Objective  Vital signs: BP (!) 141/81 (BP Location: Left Arm)   Pulse 81   Temp 97.9 F (36.6 C) (Oral)   Resp 20   Ht 5\' 4"  (1.626 m)   Wt 249 lb (112.9 kg)   LMP 05/29/2016 (Approximate)   SpO2 98%   Breastfeeding? Unknown   BMI 42.74 kg/m   Physical Exam: Gen: NAD Fundus Fundal Tone: Firm  Lochia Amount: Scant  Perineum Appearance: Intact     Data Review Labs: CBC Latest Ref Rng & Units 02/26/2017 12/22/2016 07/20/2016  WBC 3.6 - 11.0 K/uL 10.9 8.6 8.3  Hemoglobin 12.0 - 16.0 g/dL 10.5(L) 9.3(L) 12.1  Hematocrit 35.0 - 47.0 % 31.4(L) 27.7(L) 34.7  Platelets 150 - 440 K/uL 284 265 -   A NEG  Assessment/Plan  Active Problems:   Pregnancy    Plan for discharge today.   Discharge Instructions: Per After Visit Summary. Activity: Advance as tolerated. Pelvic rest for 6 weeks.  Also refer to After Visit Summary Diet: Regular Medications:  Outpatient follow up:  Follow-up Information    Lori Gross, Lori Scarber James, MD Follow up in 6 week(s).   Specialty:  Obstetrics and Gynecology Contact information: [address] [place] [address] [phone]          Postpartum contraception: Will  discuss at first office visit post-partum  Discharged Condition: good  Discharged to: home  Newborn Data: Disposition:home with mother  Apgars: APGAR (1 MIN): 9   APGAR (5 MINS): 9   APGAR (10 MINS):    Baby Feeding: Bottle    Lori Gross, M.D. 02/28/2017 10:38 AM

## 2017-02-28 NOTE — Progress Notes (Signed)
Patient blood pressures during her period of crying, fear, and anxiousness about prior documented infant situation: 147/113, 155/100, 151/94. Methyldopa given after 147/113 bp. Patient has CHTN. RN attempted to ease patient's fears and concern and checked on patient frequently. Pediatrician to unit and spoke with patient and family about infant's pustule situation. Patient no longer crying. Patient feels better after speaking with RN and Pediatrician. After speaking with doctor, patient bp: 137/86; pulse:86; 02:98% on room air; resp:17. Patient states " I feel ok to go home, I just don't want it to get infected." RN instructed patient to call Pediatrician if she notices any changes (i.e. Redness, size, smell, color, etc.). Patient verbalize understanding. Patient discharge previously held up until infant was reassessed by doctor. Infant now ok for discharge. Patient to be discharged. Vitals stable.

## 2017-02-28 NOTE — Progress Notes (Signed)
Pustule noted on top of infants head around 1145am. RN spoke with patient about it. Second RN assessed infant. Pediatrician notified and patient discharge postponed until pediatrician assesses infant (Dr. Tracey HarriesPringle states "after 4pm"). Patient is now very upset and crying. Current blood pressure 147/113, then 157/120. 1400 scheduled dose of Methyldopa given. [facility] Family member states that patient feels we are not doing anything for her and infant. RN attempted to calm patient down and ease fears. Patient's significant other has brought family members to hospital who are very upset about pustule, working patient up more.  RN spoke with pediatrician again per patient request. No transfer orders for infant. Pediatrician to come after done in the office. RN to continue to monitor patient blood pressure and attempt to calm patient down. RN to call OB if patient blood pressure does not decrease.

## 2017-02-28 NOTE — Progress Notes (Signed)
Discharge instructions given. Patient verbalizes understanding of teaching. RN instructed patient to call when ready for wheelchair for discharge. Patient did not call. Patient walked out with infant.

## 2017-04-08 ENCOUNTER — Encounter: Payer: Self-pay | Admitting: Obstetrics and Gynecology

## 2017-04-08 ENCOUNTER — Ambulatory Visit (INDEPENDENT_AMBULATORY_CARE_PROVIDER_SITE_OTHER): Payer: Medicaid Other | Admitting: Obstetrics and Gynecology

## 2017-04-08 DIAGNOSIS — Z30011 Encounter for initial prescription of contraceptive pills: Secondary | ICD-10-CM

## 2017-04-08 DIAGNOSIS — Z3009 Encounter for other general counseling and advice on contraception: Secondary | ICD-10-CM

## 2017-04-08 MED ORDER — DESOGESTREL-ETHINYL ESTRADIOL 0.15-0.02/0.01 MG (21/5) PO TABS: 1.0000 | ORAL_TABLET | Freq: Every day | ORAL | 2 refills | Status: DC

## 2017-04-08 NOTE — Progress Notes (Signed)
HPI:      [name] is a 21 y.o. [id number] who LMP was Patient's last menstrual period was 04/02/2017 (exact date).  Subjective:   She presents today for postpartum visit.  She is doing well.  She has not resumed intercourse.  She would like to start OCPs for birth control.  She is bottlefeeding.    Hx: The following portions of the patient's history were reviewed and updated as appropriate:             She  has a past medical history of Lori Gross, Lori Gross, Lori Gross, Lori Gross, Lori Gross (2016), Lori Gross, Lori Gross, not intractable, Lori Gross, and Lori Gross. She does not have any pertinent problems on file. She  has a past surgical history that includes No past surgeries. Her family history includes Lori Gross (age of onset: 3347) in her mother; Lori Gross (age of onset: 5450) in her father; Lori Gross (age of onset: 5474) in her paternal grandmother; Lori Gross in her paternal grandfather and paternal grandmother; Lori Gross in her brother, father, mother, and sister. She  reports that  has never smoked. she has never used smokeless tobacco. She reports that she does not drink alcohol or Gross drugs. She has No Known Allergies.       Review of Systems:  Review of Systems  Constitutional: Denied constitutional symptoms, night sweats, recent illness, fatigue, fever, insomnia and weight loss.  Eyes: Denied eye symptoms, eye pain, photophobia, vision change and visual disturbance.  Ears/Nose/Throat/Neck: Denied ear, nose, throat or neck symptoms, hearing loss, nasal discharge, sinus congestion and sore throat.  Cardiovascular: Denied cardiovascular symptoms, arrhythmia, chest pain/pressure, edema, exercise intolerance, orthopnea and palpitations.  Respiratory: Denied pulmonary symptoms, asthma, pleuritic pain, productive sputum, cough, dyspnea and wheezing.  Gastrointestinal: Denied, gastro-esophageal reflux, melena, nausea and vomiting.   Genitourinary: Denied genitourinary symptoms including symptomatic vaginal discharge, pelvic relaxation issues, and urinary complaints.  Musculoskeletal: Denied musculoskeletal symptoms, stiffness, swelling, muscle weakness and myalgia.  Dermatologic: Denied dermatology symptoms, rash and scar.  Neurologic: Denied neurology symptoms, dizziness, Gross, neck pain and syncope.  Psychiatric: Denied psychiatric symptoms, anxiety and depression.  Endocrine: Denied endocrine symptoms including hot flashes and night sweats.   Meds:   Current Outpatient Medications on File Prior to Visit  Medication Sig Dispense Refill  . methyldopa (ALDOMET) 250 MG tablet Take 1 tablet (250 mg total) by mouth 3 (three) times daily. (Patient taking differently: Take 250 mg by mouth every morning. ) 60 tablet 6  . Prenatal Vit-Fe Fumarate-FA (PNV PRENATAL PLUS MULTIVITAMIN) 27-1 MG TABS TAKE 1 TABLET BY MOUTH DAILY. 30 tablet 0   No current facility-administered medications on file prior to visit.     Objective:     Vitals:   04/08/17 1049  BP: 130/85  Pulse: 93        Pelvic examination   Pelvic:   Vulva: Normal appearance.  No lesions.  No abnormal scarring.    Vagina: No lesions or abnormalities noted.  Support: Normal pelvic support.  Urethra No masses tenderness or scarring.  Meatus Normal size without lesions or prolapse.  Cervix: Normal ectropion.  No lesions.  Anus: Normal exam.  No lesions.  Perineum: Normal exam.  No lesions.  Healed well.          Bimanual   Uterus: Normal size.  Non-tender.  Mobile.  AV.  Adnexae: No masses.  Non-tender to palpation.  Cul-de-sac: Negative for abnormality.     Assessment:    [id number] Patient  Active Problem List   Diagnosis Date Noted  . Lori Gross  . Lori Gross 01/19/2017  . Lori Gross 10/27/2016  . Lori Gross 11/20/2015  . Lori Gross 10/08/2014   . Lori Gross 10/08/2014  . Lori Gross 10/08/2014  . Lori Gross (BMI 30.0-34.9) 10/08/2014  . Lori Gross 10/08/2014     1. Postpartum care and examination immediately after delivery   2. Birth control counseling   3. Initiation of OCP (BCP)        Plan:            1.  Birth Control I discussed multiple birth control options and methods with the patient.  The risks and benefits of each were reviewed. 2.  OCPs The risks /benefits of OCPs have been explained to the patient in detail.  Product literature has been given to her.  I have instructed her in the Gross of OCPs and have given her literature reinforcing this information.  I have explained to the patient that OCPs are not as effective for birth control during the first month of Gross, and that another form of contraception should be used during this time.  Both first-day start and Sunday start have been explained.  The risks and benefits of each was discussed.  She has been made aware of  the fact that other medications may affect the efficacy of OCPs.  I have answered all of her questions, and I believe that she has an understanding of the effectiveness and Gross of OCPs. 3.  Patient advised to discontinue Aldomet as she was only taking 1 time per day.  Blood pressure check in 3 weeks.  Orders No orders of the defined types were placed in this encounter.    Meds ordered this encounter  Medications  . desogestrel-ethinyl estradiol (MIRCETTE) 0.15-0.02/0.01 MG (21/5) tablet    Sig: Take 1 tablet by mouth at bedtime.    Dispense:  1 Package    Refill:  2      F/U  Return in about 3 months (around 07/07/2017).  Elonda Huskyavid J. Evans, M.D. 04/08/2017 11:11 AM

## 2017-05-28 ENCOUNTER — Encounter: Payer: Self-pay | Admitting: Family Medicine

## 2017-05-28 ENCOUNTER — Ambulatory Visit (INDEPENDENT_AMBULATORY_CARE_PROVIDER_SITE_OTHER): Payer: Self-pay | Admitting: Family Medicine

## 2017-05-28 VITALS — BP 130/80 | HR 101 | Resp 16 | Ht 64.0 in | Wt 221.3 lb

## 2017-05-28 DIAGNOSIS — Z13220 Encounter for screening for lipoid disorders: Secondary | ICD-10-CM

## 2017-05-28 DIAGNOSIS — G43009 Migraine without aura, not intractable, without status migrainosus: Secondary | ICD-10-CM

## 2017-05-28 DIAGNOSIS — R809 Proteinuria, unspecified: Secondary | ICD-10-CM

## 2017-05-28 DIAGNOSIS — Z3041 Encounter for surveillance of contraceptive pills: Secondary | ICD-10-CM

## 2017-05-28 DIAGNOSIS — Z131 Encounter for screening for diabetes mellitus: Secondary | ICD-10-CM

## 2017-05-28 DIAGNOSIS — I10 Essential (primary) hypertension: Secondary | ICD-10-CM

## 2017-05-28 MED ORDER — DESOGESTREL-ETHINYL ESTRADIOL 0.15-0.02/0.01 MG (21/5) PO TABS: 1.0000 | ORAL_TABLET | Freq: Every day | ORAL | 5 refills | Status: DC

## 2017-05-28 MED ORDER — IBUPROFEN 600 MG PO TABS: 600.0000 mg | ORAL_TABLET | Freq: Three times a day (TID) | ORAL | 0 refills | Status: DC | PRN

## 2017-05-28 MED ORDER — TELMISARTAN 40 MG PO TABS: 40.0000 mg | ORAL_TABLET | Freq: Every day | ORAL | 5 refills | Status: DC

## 2017-05-28 NOTE — Progress Notes (Signed)
Name: Lori Gross   MRN: [id number]    DOB: 02/24/1996   Date:05/28/2017       Progress Note  Subjective  Chief Complaint  Chief Complaint  Patient presents with  . Hypertension    HPI  HTN: since her teenager years, her bp went up at the end of both pregnancies and had to be induced both times. She took Methyldopa throughout pregnancies, she has been off medication since Nov 2018 and bp has been under control, she does have a history of proteinuria. Discussed going back on Micardis without hctz and recheck urine micro, we can decide if she needs to continue medication when she returns for follow up. No chest pain, palpitation, losing weight, pre-pregnancy weight was 240 lbs and is down to 221.3 lbs  Contraception: taking ocp, confused about her cycle, currently bleeding, reviewed her package and she is on the inactive pills, explained that it when she is supposed to have a cycle, advised to use double protection if not taking ocp at the same time   Migraines: she states episodes are about one a week, described as throbbing, usually on top of her head, with photophobia, phonophobia but no nausea or vomiting, taking otc medication prn.   Patient Active Problem List   Diagnosis Date Noted  . Hypertension, benign 11/20/2015  . Dysmenorrhea 10/08/2014  . 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 Microalbuminuria 10/08/2014  . Obesity (BMI 30.0-34.9) 10/08/2014  . Migraine without aura and responsive to treatment 10/08/2014    Past Surgical History:  Procedure Laterality Date  . NO PAST SURGERIES      Family History  Problem Relation Age of Onset  . Hypertension Mother   . Cancer Mother [phone]       Common Bile Duct  . Hypertension Sister   . Hypertension Brother   . Diabetes Paternal Grandmother   . Cancer Paternal Grandmother [id number]       brain cancer  . Diabetes Paternal Grandfather   . Hypertension Father   . Cancer Father 1950       kidney cancer    Social History    Socioeconomic History  . Marital status: Single    Spouse name: Not on file  . Number of children: Not on file  . Years of education: Not on file  . Highest education level: Not on file  Social Needs  . Financial resource strain: Not on file  . Food insecurity - worry: Not on file  . Food insecurity - inability: Not on file  . Transportation needs - medical: Not on file  . Transportation needs - non-medical: Not on file  Occupational History  . Not on file  Tobacco Use  . Smoking status: Never Smoker  . Smokeless tobacco: Never Used  Substance and Sexual Activity  . Alcohol use: No    Alcohol/week: 0.0 oz  . Drug use: No  . Sexual activity: Yes    Partners: Male    Birth control/protection: None  Other Topics Concern  . Not on file  Social History Narrative   Dating for 5 years Lisbeth Ply( [place] ) they have  a baby girl - Rodman PickleCassidy on July 21st, 2017 and the are  living together   Working full time [employer].      Current Outpatient Medications:  .  desogestrel-ethinyl estradiol (MIRCETTE) 0.15-0.02/0.01 MG (21/5) tablet, Take 1 tablet by mouth at bedtime., Disp: 1 Package, Rfl: 5 .  ibuprofen (ADVIL,MOTRIN) 600 MG tablet, Take  1 tablet (600 mg total) by mouth every 8 (eight) hours as needed., Disp: 30 tablet, Rfl: 0 .  telmisartan (MICARDIS) 40 MG tablet, Take 1 tablet (40 mg total) by mouth daily., Disp: 30 tablet, Rfl: 5  No Known Allergies   ROS  Constitutional: Negative for fever or weight change.  Respiratory: Negative for cough and shortness of breath.   Cardiovascular: Negative for chest pain or palpitations.  Gastrointestinal: Negative for abdominal pain, no bowel changes.  Musculoskeletal: Negative for gait problem or joint swelling.  Skin: Negative for rash.  Neurological: Negative for dizziness, positive for intermittent  headache.  No other specific complaints in a complete review of systems (except as listed in HPI  above).  Objective  Vitals:   05/28/17 1353  BP: 130/80  Pulse: (!) 101  Resp: 16  SpO2: 99%  Weight: 221 lb 4.8 oz (100.4 kg)  Height: 5\' 4"  (1.626 m)    Body mass index is 37.99 kg/m.  Physical Exam  Constitutional: Patient appears well-developed and well-nourished. Obese No distress.  HEENT: head atraumatic, normocephalic, pupils equal and reactive to light,  neck supple, throat within normal limits Cardiovascular: Normal rate, regular rhythm and normal heart sounds.  No murmur heard. No BLE edema. Pulmonary/Chest: Effort normal and breath sounds normal. No respiratory distress. Abdominal: Soft.  There is no tenderness. Psychiatric: Patient has a normal mood and affect. behavior is normal. Judgment and thought content normal.  PHQ2/9: Depression screen [facility] 2/9 11/20/2015 06/14/2015 02/21/2015 12/11/2014 10/11/2014  Decreased Interest 0 0 0 0 0  Down, Depressed, Hopeless 0 0 0 0 0  PHQ - 2 Score 0 0 0 0 0    Fall Risk: Fall Risk  11/20/2015 06/14/2015 02/21/2015 12/11/2014 10/11/2014  Falls in the past year? No No No No No     Functional Status Survey: Is the patient deaf or have difficulty hearing?: No Does the patient have difficulty seeing, even when wearing glasses/contacts?: No Does the patient have difficulty concentrating, remembering, or making decisions?: No Does the patient have difficulty walking or climbing stairs?: No Does the patient have difficulty dressing or bathing?: No Does the patient have difficulty doing errands alone such as visiting a doctor's office or shopping?: No   Assessment & Plan  1. Essential (primary) hypertension  - telmisartan (MICARDIS) 40 MG tablet; Take 1 tablet (40 mg total) by mouth daily.  Dispense: 30 tablet; Refill: 5 - Urine Microalbumin w/creat. ratio - Comprehensive metabolic panel  2. Microalbuminuria  - Urine Microalbumin w/creat. ratio  3. Migraine without aura and responsive to treatment  - ibuprofen (ADVIL,MOTRIN)  600 MG tablet; Take 1 tablet (600 mg total) by mouth every 8 (eight) hours as needed.  Dispense: 30 tablet; Refill: 0  4. Lipid screening  - Lipid panel  5. Screening for diabetes mellitus (DM)  - Hemoglobin A1c  6. Encounter for surveillance of contraceptive pills  - desogestrel-ethinyl estradiol (MIRCETTE) 0.15-0.02/0.01 MG (21/5) tablet; Take 1 tablet by mouth at bedtime.  Dispense: 1 Package; Refill: 5

## 2017-05-31 ENCOUNTER — Telehealth: Payer: Self-pay | Admitting: Family Medicine

## 2017-05-31 ENCOUNTER — Other Ambulatory Visit: Payer: Self-pay | Admitting: Family Medicine

## 2017-05-31 MED ORDER — VALSARTAN 160 MG PO TABS: 160.0000 mg | ORAL_TABLET | Freq: Every day | ORAL | 2 refills | Status: DC

## 2017-05-31 NOTE — Telephone Encounter (Signed)
Changed to Valsartan 160

## 2017-05-31 NOTE — Telephone Encounter (Signed)
Copied from CRM [phone]. Topic: Quick Communication - See Telephone Encounter >> May 31, 2017  8:58 AM Waymon AmatoBurton, Donna F wrote: CRM for notification. See Telephone encounter for: medication for her blood pressure Essential  was not covered it with medicatid and she needs to talk about something that is covered   Best number [phone]  05/31/17.

## 2017-08-30 ENCOUNTER — Ambulatory Visit: Payer: Self-pay | Admitting: Family Medicine

## 2017-09-02 ENCOUNTER — Ambulatory Visit: Payer: Self-pay

## 2017-09-02 NOTE — Telephone Encounter (Signed)
Patient called in with c/o "high blood pressure and headache." She says "I've been trying to give plasma and my BP was elevated each time I went. I checked it at home and it was high." I asked her to check it now, and she says "it is 120/116. I have a headache and I just haven't been feeling right. I am supposed to be on a BP pill, but it was too high for me to buy. I don't have a job and I couldn't afford it at that time. I have medicaid now." I asked did she call the provider to let her know the medication was too expensive, she says "no, I didn't. I was feeling fine, so I just thought my BP was doing better." I asked about other symptoms, she says "I'm just tired all the time, headaches, and sometimes I have a hard time getting my breath." According to protocol, see PCP within 2 weeks, no availability with PCP today or tomorrow, appointment scheduled for tomorrow at 1440 with Sharyon Cable, NP, care advice given, patient verbalized understanding. I also advised the patient to check with [facility] and see if the medications will be cheaper, because they have assistance programs for patients, she verbalized understanding.   Reason for Disposition . [1] Systolic BP  >= 140 OR Diastolic >= 90 AND [2] not taking BP medications  Answer Assessment - Initial Assessment Questions 1. BLOOD PRESSURE: "What is the blood pressure?" "Did you take at least two measurements 5 minutes apart?"     120/116 2. ONSET: "When did you take your blood pressure?"     Now 3. HOW: "How did you obtain the blood pressure?" (e.g., visiting nurse, automatic home BP monitor)     Automatic cuff at home 4. HISTORY: "Do you have a history of high blood pressure?"     Yes 5. MEDICATIONS: "Are you taking any medications for blood pressure?" "Have you missed any doses recently?"     No-couldn't afford 6. OTHER SYMPTOMS: "Do you have any symptoms?" (e.g., headache, chest pain, blurred vision, difficulty breathing, weakness)  Headache, difficulty breathing sometimes, tired a lot 7. PREGNANCY: "Is there any chance you are pregnant?" "When was your last menstrual period?"     No-LMP 07/22/17  Protocols used: HIGH BLOOD PRESSURE-A-AH

## 2017-09-03 ENCOUNTER — Ambulatory Visit (INDEPENDENT_AMBULATORY_CARE_PROVIDER_SITE_OTHER): Payer: Medicaid Other | Admitting: Nurse Practitioner

## 2017-09-03 ENCOUNTER — Encounter: Payer: Self-pay | Admitting: Nurse Practitioner

## 2017-09-03 VITALS — BP 158/102 | HR 100 | Temp 98.3°F | Resp 16 | Ht 64.0 in | Wt 222.4 lb

## 2017-09-03 DIAGNOSIS — D509 Iron deficiency anemia, unspecified: Secondary | ICD-10-CM

## 2017-09-03 DIAGNOSIS — F439 Reaction to severe stress, unspecified: Secondary | ICD-10-CM

## 2017-09-03 DIAGNOSIS — I10 Essential (primary) hypertension: Secondary | ICD-10-CM

## 2017-09-03 DIAGNOSIS — F419 Anxiety disorder, unspecified: Secondary | ICD-10-CM

## 2017-09-03 MED ORDER — HYDROCHLOROTHIAZIDE 25 MG PO TABS: 25.0000 mg | ORAL_TABLET | Freq: Every day | ORAL | 3 refills | Status: DC

## 2017-09-03 MED ORDER — LISINOPRIL 20 MG PO TABS: 20.0000 mg | ORAL_TABLET | Freq: Every day | ORAL | 0 refills | Status: DC

## 2017-09-03 MED ORDER — HYDROXYZINE PAMOATE 25 MG PO CAPS: 25.0000 mg | ORAL_CAPSULE | Freq: Once | ORAL | 0 refills | Status: DC | PRN

## 2017-09-03 NOTE — Patient Instructions (Addendum)
- Call us in one week with an update on your blood pressures at home - check your blood pressure 3 times a week when you are calm and comfortable and write them down/ put them in your phone - Do need follow-up in one month - If you are having chest pain, blurry vision, severe headache, nausea please get emergency medical attention    DASH Eating Plan DASH stands for "Dietary Approaches to Stop Hypertension." The DASH eating plan is a healthy eating plan that has been shown to reduce high blood pressure (hypertension). It may also reduce your risk for type 2 diabetes, heart disease, and stroke. The DASH eating plan may also help with weight loss. What are tips for following this plan? General guidelines  Avoid eating more than 2,300 mg (milligrams) of salt (sodium) a day. If you have hypertension, you may need to reduce your sodium intake to 1,500 mg a day.  Limit alcohol intake to no more than 1 drink a day for nonpregnant women and 2 drinks a day for men. One drink equals 12 oz of beer, 5 oz of wine, or 1 oz of hard liquor.  Work with your health care provider to maintain a healthy body weight or to lose weight. Ask what an ideal weight is for you.  Get at least 30 minutes of exercise that causes your heart to beat faster (aerobic exercise) most days of the week. Activities may include walking, swimming, or biking.  Work with your health care provider or diet and nutrition specialist (dietitian) to adjust your eating plan to your individual calorie needs. Reading food labels  Check food labels for the amount of sodium per serving. Choose foods with less than 5 percent of the Daily Value of sodium. Generally, foods with less than 300 mg of sodium per serving fit into this eating plan.  To find whole grains, look for the word "whole" as the first word in the ingredient list. Shopping  Buy products labeled as "low-sodium" or "no salt added."  Buy fresh foods. Avoid canned foods and  premade or frozen meals. Cooking  Avoid adding salt when cooking. Use salt-free seasonings or herbs instead of table salt or sea salt. Check with your health care provider or pharmacist before using salt substitutes.  Do not fry foods. Cook foods using healthy methods such as baking, boiling, grilling, and broiling instead.  Cook with heart-healthy oils, such as olive, canola, soybean, or sunflower oil. Meal planning   Eat a balanced diet that includes: ? 5 or more servings of fruits and vegetables each day. At each meal, try to fill half of your plate with fruits and vegetables. ? Up to 6-8 servings of whole grains each day. ? Less than 6 oz of lean meat, poultry, or fish each day. A 3-oz serving of meat is about the same size as a deck of cards. One egg equals 1 oz. ? 2 servings of low-fat dairy each day. ? A serving of nuts, seeds, or beans 5 times each week. ? Heart-healthy fats. Healthy fats called Omega-3 fatty acids are found in foods such as flaxseeds and coldwater fish, like sardines, salmon, and mackerel.  Limit how much you eat of the following: ? Canned or prepackaged foods. ? Food that is high in trans fat, such as fried foods. ? Food that is high in saturated fat, such as fatty meat. ? Sweets, desserts, sugary drinks, and other foods with added sugar. ? Full-fat dairy products.  Do not  salt foods before eating.  Try to eat at least 2 vegetarian meals each week.  Eat more home-cooked food and less restaurant, buffet, and fast food.  When eating at a restaurant, ask that your food be prepared with less salt or no salt, if possible. What foods are recommended? The items listed may not be a complete list. Talk with your dietitian about what dietary choices are best for you. Grains Whole-grain or whole-wheat bread. Whole-grain or whole-wheat pasta. Brown rice. [place]. Bulgur. Whole-grain and low-sodium cereals. Pita bread. Low-fat, low-sodium crackers.  Whole-wheat flour tortillas. Vegetables Fresh or frozen vegetables (raw, steamed, roasted, or grilled). Low-sodium or reduced-sodium tomato and vegetable juice. Low-sodium or reduced-sodium tomato sauce and tomato paste. Low-sodium or reduced-sodium canned vegetables. Fruits All fresh, dried, or frozen fruit. Canned fruit in natural juice (without added sugar). Meat and other protein foods Skinless chicken or [place]. Ground chicken or [place]. Pork with fat trimmed off. Fish and seafood. Egg whites. Dried beans, peas, or lentils. Unsalted nuts, nut butters, and seeds. Unsalted canned beans. Lean cuts of beef with fat trimmed off. Low-sodium, lean deli meat. Dairy Low-fat (1%) or fat-free (skim) milk. Fat-free, low-fat, or reduced-fat cheeses. Nonfat, low-sodium ricotta or cottage cheese. Low-fat or nonfat yogurt. Low-fat, low-sodium cheese. Fats and oils Soft margarine without trans fats. Vegetable oil. Low-fat, reduced-fat, or light mayonnaise and salad dressings (reduced-sodium). Canola, safflower, olive, soybean, and sunflower oils. Avocado. Seasoning and other foods Herbs. Spices. Seasoning mixes without salt. Unsalted popcorn and pretzels. Fat-free sweets. What foods are not recommended? The items listed may not be a complete list. Talk with your dietitian about what dietary choices are best for you. Grains Baked goods made with fat, such as croissants, muffins, or some breads. Dry pasta or rice meal packs. Vegetables Creamed or fried vegetables. Vegetables in a cheese sauce. Regular canned vegetables (not low-sodium or reduced-sodium). Regular canned tomato sauce and paste (not low-sodium or reduced-sodium). Regular tomato and vegetable juice (not low-sodium or reduced-sodium). Angie Fava. Olives. Fruits Canned fruit in a light or heavy syrup. Fried fruit. Fruit in cream or butter sauce. Meat and other protein foods Fatty cuts of meat. Ribs. Fried meat. Berniece Salines. Sausage. Bologna and other  processed lunch meats. Salami. Fatback. Hotdogs. Bratwurst. Salted nuts and seeds. Canned beans with added salt. Canned or smoked fish. Whole eggs or egg yolks. Chicken or [place] with skin. Dairy Whole or 2% milk, cream, and half-and-half. Whole or full-fat cream cheese. Whole-fat or sweetened yogurt. Full-fat cheese. Nondairy creamers. Whipped toppings. Processed cheese and cheese spreads. Fats and oils Butter. Stick margarine. Lard. Shortening. Ghee. Bacon fat. Tropical oils, such as coconut, palm kernel, or palm oil. Seasoning and other foods Salted popcorn and pretzels. Onion salt, garlic salt, seasoned salt, table salt, and sea salt. Worcestershire sauce. Tartar sauce. Barbecue sauce. Teriyaki sauce. Soy sauce, including reduced-sodium. Steak sauce. Canned and packaged gravies. Fish sauce. Oyster sauce. Cocktail sauce. Horseradish that you find on the shelf. Ketchup. Mustard. Meat flavorings and tenderizers. Bouillon cubes. Hot sauce and Tabasco sauce. Premade or packaged marinades. Premade or packaged taco seasonings. Relishes. Regular salad dressings. Where to find more information:  National Heart, Lung, and [place]: https://wilson-eaton.com/  American Heart Association: www.heart.org Summary  The DASH eating plan is a healthy eating plan that has been shown to reduce high blood pressure (hypertension). It may also reduce your risk for type 2 diabetes, heart disease, and stroke.  With the DASH eating plan, you should limit salt (sodium) intake to  2,300 mg a day. If you have hypertension, you may need to reduce your sodium intake to 1,500 mg a day.  When on the DASH eating plan, aim to eat more fresh fruits and vegetables, whole grains, lean proteins, low-fat dairy, and heart-healthy fats.  Work with your health care provider or diet and nutrition specialist (dietitian) to adjust your eating plan to your individual calorie needs. This information is not intended to replace advice given to  you by your health care provider. Make sure you discuss any questions you have with your health care provider. Document Released: 03/26/2011 Document Revised: 03/30/2016 Document Reviewed: 03/30/2016 Elsevier Interactive Patient Education  2018 [employer].    Hypertension Hypertension is another name for high blood pressure. High blood pressure forces your heart to work harder to pump blood. This can cause problems over time. There are two numbers in a blood pressure reading. There is a top number (systolic) over a bottom number (diastolic). It is best to have a blood pressure below 120/80. Healthy choices can help lower your blood pressure. You may need medicine to help lower your blood pressure if:  Your blood pressure cannot be lowered with healthy choices.  Your blood pressure is higher than 130/80.  Follow these instructions at home: Eating and drinking  If directed, follow the DASH eating plan. This diet includes: ? Filling half of your plate at each meal with fruits and vegetables. ? Filling one quarter of your plate at each meal with whole grains. Whole grains include whole wheat pasta, brown rice, and whole grain bread. ? Eating or drinking low-fat dairy products, such as skim milk or low-fat yogurt. ? Filling one quarter of your plate at each meal with low-fat (lean) proteins. Low-fat proteins include fish, skinless chicken, eggs, beans, and tofu. ? Avoiding fatty meat, cured and processed meat, or chicken with skin. ? Avoiding premade or processed food.  Eat less than 1,500 mg of salt (sodium) a day.  Limit alcohol use to no more than 1 drink a day for nonpregnant women and 2 drinks a day for men. One drink equals 12 oz of beer, 5 oz of wine, or 1 oz of hard liquor. Lifestyle  Work with your doctor to stay at a healthy weight or to lose weight. Ask your doctor what the best weight is for you.  Get at least 30 minutes of exercise that causes your heart to beat faster  (aerobic exercise) most days of the week. This may include walking, swimming, or biking.  Get at least 30 minutes of exercise that strengthens your muscles (resistance exercise) at least 3 days a week. This may include lifting weights or pilates.  Do not use any products that contain nicotine or tobacco. This includes cigarettes and e-cigarettes. If you need help quitting, ask your doctor.  Check your blood pressure at home as told by your doctor.  Keep all follow-up visits as told by your doctor. This is important. Medicines  Take over-the-counter and prescription medicines only as told by your doctor. Follow directions carefully.  Do not skip doses of blood pressure medicine. The medicine does not work as well if you skip doses. Skipping doses also puts you at risk for problems.  Ask your doctor about side effects or reactions to medicines that you should watch for. Contact a doctor if:  You think you are having a reaction to the medicine you are taking.  You have headaches that keep coming back (recurring).  You feel dizzy.  You have swelling in your ankles.  You have trouble with your vision. Get help right away if:  You get a very bad headache.  You start to feel confused.  You feel weak or numb.  You feel faint.  You get very bad pain in your: ? Chest. ? Belly (abdomen).  You throw up (vomit) more than once.  You have trouble breathing. Summary  Hypertension is another name for high blood pressure.  Making healthy choices can help lower blood pressure. If your blood pressure cannot be controlled with healthy choices, you may need to take medicine. This information is not intended to replace advice given to you by your health care provider. Make sure you discuss any questions you have with your health care provider. Document Released: 09/23/2007 Document Revised: 03/04/2016 Document Reviewed: 03/04/2016 Elsevier Interactive Patient Education  [employer].

## 2017-09-03 NOTE — Progress Notes (Signed)
Name: Lori Gross   MRN: [id number]    DOB: 05-May-1995   Date:09/03/2017       Progress Note  Subjective  Chief Complaint  Chief Complaint  Patient presents with  . Hypertension    never started medicationthat was prescribed due to cost and Medicaid    HPI  Hypertension Since her teenager years, her bp went up at the end of both pregnancies and had to be induced both times. She took Methyldopa throughout pregnancies, she has been off medication since Nov 2018 and bp has been under control, she does have a history of proteinuria. Labs were ordered on 05/2017- never completed.  Patient was prescribed telmisartan and last visit wasn't covered by medicaid and switched to valsartan- patient states having issues with insurance and was too much to buy OTC. Requesting cheaper medication so she can buy it over the counter. Has not been on any blood pressure medicine since last visit.  She is not adding salt to foods- but does drink a lot of soda.  She checks blood pressures one time it was 170/120- had headache and chest pain with that- states she just laid down and symptoms improved over time.  Endorses fatigue.  Denies chest pain or headaches right now.  Denies blurry vision.  BP Readings from Last 3 Encounters:  09/03/17 (!) 158/102  05/28/17 130/80  04/08/17 130/85   Wt Readings from Last 3 Encounters:  09/03/17 222 lb 6.4 oz (100.9 kg)  05/28/17 221 lb 4.8 oz (100.4 kg)  04/08/17 229 lb 4 oz (104 kg)   Anemia Patient was [facility] states she had anemia, low iron levels.  During pregnancy was on iron supplementation.  Endorses fatigue denies shortness of breath.  Anxiety and stress Patient states she is currently unemployed and watching her two children at home. States she thinks her BP is so elevated due to increased stress about job [employer]. Endorses 2 episodes of severe panic in the last 3 months. States overwhelmed, support with significant other.   Patient Active  Problem List   Diagnosis Date Noted  . Hypertension, benign 11/20/2015  . Dysmenorrhea 10/08/2014  . 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 Microalbuminuria 10/08/2014  . Obesity (BMI 30.0-34.9) 10/08/2014  . Migraine without aura and responsive to treatment 10/08/2014    Past Medical History:  Diagnosis Date  . Benign headache   . Dysmenorrhea   . Grieving   . Hypertension   . Marijuana use 2016   positive UDS  . Microalbuminuria   . Migraine without aura and without status migrainosus, not intractable   . Obesity   . Urticaria     Past Surgical History:  Procedure Laterality Date  . NO PAST SURGERIES      Social History   Tobacco Use  . Smoking status: Never Smoker  . Smokeless tobacco: Never Used  Substance Use Topics  . Alcohol use: No    Alcohol/week: 0.0 oz     Current Outpatient Medications:  .  desogestrel-ethinyl estradiol (MIRCETTE) 0.15-0.02/0.01 MG (21/5) tablet, Take 1 tablet by mouth at bedtime., Disp: 1 Package, Rfl: 5 .  ibuprofen (ADVIL,MOTRIN) 600 MG tablet, Take 1 tablet (600 mg total) by mouth every 8 (eight) hours as needed., Disp: 30 tablet, Rfl: 0 .  valsartan (DIOVAN) 160 MG tablet, Take 1 tablet (160 mg total) by mouth daily. (Patient not taking: Reported on 09/03/2017), Disp: 30 tablet, Rfl: 2  No Known Allergies  ROS  No other specific complaints in a  complete review of systems (except as listed in HPI above).  Objective  Vitals:   09/03/17 1417  BP: (!) 158/102  Pulse: 100  Resp: 16  Temp: 98.3 F (36.8 C)  TempSrc: Oral  SpO2: 97%  Weight: 222 lb 6.4 oz (100.9 kg)  Height:  (1.626 m)     Body mass index is 38.17 kg/m.  Nursing Note and Vital Signs reviewed.  Physical Exam  Constitutional: Patient appears well-developed and well-nourished. Obese No distress.  Cardiovascular: mildly elevated rate, regular rhythm, S1/S2 present.  No murmur or rub heard. Pulses intact Pulmonary/Chest: Effort normal and breath  sounds clear. No respiratory distress or retractions. Psychiatric: Patient has a normal mood and affect. behavior is normal. Judgment and thought content normal.  No results found for this or any previous visit (from the past 72 hour(s)).  Assessment & Plan  1. Iron deficiency anemia, unspecified iron deficiency anemia type - CBC - Fe+TIBC+Fer  2. Uncontrolled hypertension Discussed DASH and lifestyle modification in detail with patient -CMP -Urine micro - hydrochlorothiazide (HYDRODIURIL) 25 MG tablet; Take 1 tablet (25 mg total) by mouth daily.  Dispense: 90 tablet; Refill: 3  3. Stress at home - Ambulatory referral to [facility]  4. Anxiety - discussed counseling services - hydrOXYzine (VISTARIL) 25 MG capsule; Take 1 capsule (25 mg total) by mouth once as needed for up to 1 dose for anxiety.  Dispense: 10 capsule; Refill: 0   She is in between insurances right now, difficulty with payment.  Will check blood pressures at home and inform us in 1 week, one month follow-up.  Discussed emergency care and hypertensive urgency symptoms, states will receive emergency care if necessary. Face-to-face time with patient was more than 25 minutes, >50% time spent counseling and coordination of care   -Red flags and when to present for emergency care or RTC including fever >101.40F, chest pain, shortness of breath, new/worsening/un-resolving symptoms, reviewed with patient at time of visit. Follow up and care instructions discussed and provided in AVS.

## 2017-09-04 LAB — COMPREHENSIVE METABOLIC PANEL
AG RATIO: 1.2 (calc) (ref 1.0–2.5)
ALT: 14 U/L (ref 6–29)
AST: 12 U/L (ref 10–30)
Albumin: 4 g/dL (ref 3.6–5.1)
Alkaline phosphatase (APISO): 102 U/L (ref 33–115)
BUN: 9 mg/dL (ref 7–25)
CHLORIDE: 105 mmol/L (ref 98–110)
CO2: 25 mmol/L (ref 20–32)
Calcium: 9.2 mg/dL (ref 8.6–10.2)
Creat: 0.76 mg/dL (ref 0.50–1.10)
GLOBULIN: 3.3 g/dL (ref 1.9–3.7)
GLUCOSE: 84 mg/dL (ref 65–99)
POTASSIUM: 4.1 mmol/L (ref 3.5–5.3)
SODIUM: 138 mmol/L (ref 135–146)
TOTAL PROTEIN: 7.3 g/dL (ref 6.1–8.1)
Total Bilirubin: 0.2 mg/dL (ref 0.2–1.2)

## 2017-09-04 LAB — CBC
HEMATOCRIT: 37.1 % (ref 35.0–45.0)
Hemoglobin: 12.8 g/dL (ref 11.7–15.5)
MCH: 32.6 pg (ref 27.0–33.0)
MCHC: 34.5 g/dL (ref 32.0–36.0)
MCV: 94.4 fL (ref 80.0–100.0)
MPV: 10.6 fL (ref 7.5–12.5)
Platelets: 280 10*3/uL (ref 140–400)
RBC: 3.93 10*6/uL (ref 3.80–5.10)
RDW: 14.4 % (ref 11.0–15.0)
WBC: 6.4 10*3/uL (ref 3.8–10.8)

## 2017-09-04 LAB — IRON,TIBC AND FERRITIN PANEL
%SAT: 14 % (calc) (ref 11–50)
Ferritin: 19 ng/mL (ref 10–154)
Iron: 56 ug/dL (ref 40–190)
TIBC: 390 ug/dL (ref 250–450)

## 2017-09-04 LAB — MICROALBUMIN / CREATININE URINE RATIO
Creatinine, Urine: 197 mg/dL (ref 20–275)
Microalb Creat Ratio: 7 mcg/mg creat (ref <30)
Microalb, Ur: 1.3 mg/dL

## 2017-09-04 LAB — LIPID PANEL
Cholesterol: 226 mg/dL — ABNORMAL HIGH (ref <200)
HDL: 60 mg/dL (ref >50)
LDL Cholesterol (Calc): 123 mg/dL (calc) — ABNORMAL HIGH
Non-HDL Cholesterol (Calc): 166 mg/dL (calc) — ABNORMAL HIGH (ref <130)
Total CHOL/HDL Ratio: 3.8 (calc) (ref <5.0)
Triglycerides: 278 mg/dL — ABNORMAL HIGH (ref <150)

## 2017-09-04 LAB — HEMOGLOBIN A1C
EAG (MMOL/L): 5.7 (calc)
HEMOGLOBIN A1C: 5.2 %{Hb} (ref <5.7)
MEAN PLASMA GLUCOSE: 103 (calc)

## 2017-09-06 ENCOUNTER — Telehealth: Payer: Self-pay

## 2017-09-06 NOTE — Telephone Encounter (Signed)
Left a voicemail for patient to call back regarding lab results.

## 2017-09-06 NOTE — Telephone Encounter (Signed)
-----   Message from Alba Cory, MD sent at 09/06/2017  7:55 AM EDT ----- Normal urine micro  Normal sugar, kidney and liver function test Lipid panel is not at goal , elevated triglycerides and high LDL. She needs to resume life style modification. Avoid sweets and fried foods and recheck in 6 months  Normal hgbA1C

## 2017-09-06 NOTE — Telephone Encounter (Signed)
Patient called, left VM to return call to the office for lab results. 

## 2017-09-21 ENCOUNTER — Telehealth: Payer: Self-pay | Admitting: Family Medicine

## 2017-09-21 DIAGNOSIS — I10 Essential (primary) hypertension: Secondary | ICD-10-CM

## 2017-09-21 MED ORDER — AMLODIPINE BESYLATE 5 MG PO TABS: 5.0000 mg | ORAL_TABLET | Freq: Every day | ORAL | 0 refills | Status: DC

## 2017-09-21 NOTE — Telephone Encounter (Signed)
I will send norvasc, should be okay if she gets pregnant but not while breastfeeding.  She will need to follow up wit nephrologist also. BP very high for her.  Recheck bp and send us the reading. [facility] will place referral and [facility]

## 2017-09-21 NOTE — Telephone Encounter (Signed)
Copied from CRM [phone]. Topic: Quick Communication - See Telephone Encounter >> Sep 21, 2017  9:34 AM Jolayne Hainesaylor, Brittany L wrote: CRM for notification. See Telephone encounter for: 09/21/17.  Blood Pressure readings per Dr Carlynn PurlSowles ::  155/115 5/30 170-115 6/1 173/102 pm 6/3

## 2017-09-22 NOTE — Telephone Encounter (Signed)
Patient notified and states she is not breastfeeding. Informed patient to start taking the Amlodipine 5 mg daily and keep a eye on her BP and relay them to us.

## 2017-10-01 ENCOUNTER — Ambulatory Visit: Payer: Medicaid Other | Admitting: Nurse Practitioner

## 2017-10-04 ENCOUNTER — Ambulatory Visit (INDEPENDENT_AMBULATORY_CARE_PROVIDER_SITE_OTHER): Payer: Medicaid Other | Admitting: Nurse Practitioner

## 2017-10-04 ENCOUNTER — Encounter: Payer: Self-pay | Admitting: Nurse Practitioner

## 2017-10-04 VITALS — BP 124/80 | HR 98 | Temp 98.3°F | Resp 16 | Ht 64.0 in | Wt 219.7 lb

## 2017-10-04 DIAGNOSIS — I10 Essential (primary) hypertension: Secondary | ICD-10-CM | POA: Diagnosis not present

## 2017-10-04 DIAGNOSIS — Z3041 Encounter for surveillance of contraceptive pills: Secondary | ICD-10-CM | POA: Diagnosis not present

## 2017-10-04 DIAGNOSIS — E669 Obesity, unspecified: Secondary | ICD-10-CM

## 2017-10-04 MED ORDER — DESOGESTREL-ETHINYL ESTRADIOL 0.15-0.02/0.01 MG (21/5) PO TABS: 1.0000 | ORAL_TABLET | Freq: Every day | ORAL | 2 refills | Status: DC

## 2017-10-04 MED ORDER — AMLODIPINE BESYLATE 5 MG PO TABS: 5.0000 mg | ORAL_TABLET | Freq: Every day | ORAL | 0 refills | Status: DC

## 2017-10-04 MED ORDER — HYDROCHLOROTHIAZIDE 25 MG PO TABS: 25.0000 mg | ORAL_TABLET | Freq: Every day | ORAL | 0 refills | Status: DC

## 2017-10-04 NOTE — Progress Notes (Addendum)
Name: Lori Gross   MRN: [id number]    DOB: 06/11/1995   Date:10/04/2017       Progress Note  Subjective  Chief Complaint  Chief Complaint  Patient presents with  . Hypertension    BP follow up    HPI  Hypertension Taking amlodipine 5mg  and HCTZ 25mg  a day and checks bp at home states normally 120's/70's. No missed doses. Denies chest pain, headaches sts has been feeling much better.  BP Readings from Last 3 Encounters:  10/04/17 124/80  09/03/17 (!) 158/102  05/28/17 130/80   Lab Results  Component Value Date   CREATININE 0.76 09/03/2017    Depression screen PHQ 2/9 10/04/2017  Decreased Interest 0  Down, Depressed, Hopeless 0  PHQ - 2 Score 0   Wt Readings from Last 3 Encounters:  10/04/17 219 lb 11.2 oz (99.7 kg)  09/03/17 222 lb 6.4 oz (100.9 kg)  05/28/17 221 lb 4.8 oz (100.4 kg)     Patient Active Problem List   Diagnosis Date Noted  . Hypertension, benign 11/20/2015  . Dysmenorrhea 10/08/2014  . 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 Microalbuminuria 10/08/2014  . Obesity (BMI 30.0-34.9) 10/08/2014  . Migraine without aura and responsive to treatment 10/08/2014    Past Medical History:  Diagnosis Date  . Benign headache   . Dysmenorrhea   . Grieving   . Hypertension   . Marijuana use 2016   positive UDS  . Microalbuminuria   . Migraine without aura and without status migrainosus, not intractable   . Obesity   . Urticaria     Past Surgical History:  Procedure Laterality Date  . NO PAST SURGERIES      Social History   Tobacco Use  . Smoking status: Never Smoker  . Smokeless tobacco: Never Used  Substance Use Topics  . Alcohol use: No    Alcohol/week: 0.0 oz     Current Outpatient Medications:  .  amLODipine (NORVASC) 5 MG tablet, Take 1 tablet (5 mg total) by mouth daily., Disp: 30 tablet, Rfl: 0 .  desogestrel-ethinyl estradiol (MIRCETTE) 0.15-0.02/0.01 MG (21/5) tablet, Take 1 tablet by mouth at bedtime., Disp: 1 Package, Rfl:  5 .  hydrochlorothiazide (HYDRODIURIL) 25 MG tablet, Take 1 tablet (25 mg total) by mouth daily., Disp: 90 tablet, Rfl: 3 .  hydrOXYzine (VISTARIL) 25 MG capsule, Take 1 capsule (25 mg total) by mouth once as needed for up to 1 dose for anxiety., Disp: 10 capsule, Rfl: 0 .  ibuprofen (ADVIL,MOTRIN) 600 MG tablet, Take 1 tablet (600 mg total) by mouth every 8 (eight) hours as needed., Disp: 30 tablet, Rfl: 0  No Known Allergies  ROS  No other specific complaints in a complete review of systems (except as listed in HPI above).  Objective  Vitals:   10/04/17 1347  BP: 124/80  Pulse: 98  Resp: 16  Temp: 98.3 F (36.8 C)  TempSrc: Oral  SpO2: 98%  Weight: 219 lb 11.2 oz (99.7 kg)  Height: 5\' 4"  (1.626 m)     Body mass index is 37.71 kg/m.  Nursing Note and Vital Signs reviewed.  Physical Exam   Constitutional: Patient appears well-developed and well-nourished. Obese No distress.  Cardiovascular: Normal rate, regular rhythm, S1/S2 present.  No murmur or rub heard.  Pulmonary/Chest: Effort normal and breath sounds clear. No respiratory distress or retractions. Psychiatric: Patient has a normal mood and affect. behavior is normal. Judgment and thought content normal.  No results found for this or any  previous visit (from the past 72 hour(s)).  Assessment & Plan  1. Hypertension, benign Stable, dash diet  - amLODipine (NORVASC) 5 MG tablet; Take 1 tablet (5 mg total) by mouth daily.  Dispense: 90 tablet; Refill: 0 - hydrochlorothiazide (HYDRODIURIL) 25 MG tablet; Take 1 tablet (25 mg total) by mouth daily.  Dispense: 90 tablet; Refill: 0  2. Encounter for surveillance of contraceptive pills - desogestrel-ethinyl estradiol (MIRCETTE) 0.15-0.02/0.01 MG (21/5) tablet; Take 1 tablet by mouth at bedtime.  Dispense: 1 Package; Refill: 2  3. Obesity (BMI 30.0-34.9) Discussed diet and exercise, positive 2 pound weight loss since last visit.   -Red flags and when to present for  emergency care or RTC including fever >101.105F, chest pain, shortness of breath, new/worsening/un-resolving symptoms, reviewed with patient at time of visit. Follow up and care instructions discussed and provided in AVS.  ----------------------------------------- I have reviewed this encounter including the documentation in this note and/or discussed this patient with the provider, Sharyon Cable DNP AGNP-C. I am certifying that I agree with the content of this note as supervising physician. Baruch Gouty, MD [facility] 10/05/2017, 5:22 PM

## 2017-10-04 NOTE — Patient Instructions (Addendum)
Bad cholesterol, also called low-density lipoprotein (LDL), carries cholesterol and other fats that your liver makes to your body tissue. If it builds up in blood vessels, LDL can cause heart disease and other health problems. Your LDL level should be below 098. If you have diabetes or a possible heart problem, your LDL should be below 70.  Eat: Eat 20 to 30 grams of soluble fiber every day. Foods such as fruits and vegetables, whole grains, beans, peas, nuts, and seeds can help lower LDL. Avoid: Saturated fats (Dairy foods - such as butter, cream, ghee, regular-fat milk and cheese. Meat - such as fatty cuts of beef, pork and lamb, processed meats like salami, sausages and the skin on chicken. Lard., fatty snack foods, cakes, biscuits, pies and deep fried foods) Avoid smoking    DASH Eating Plan DASH stands for "Dietary Approaches to Stop Hypertension." The DASH eating plan is a healthy eating plan that has been shown to reduce high blood pressure (hypertension). It may also reduce your risk for type 2 diabetes, heart disease, and stroke. The DASH eating plan may also help with weight loss. What are tips for following this plan? General guidelines  Avoid eating more than 2,300 mg (milligrams) of salt (sodium) a day. If you have hypertension, you may need to reduce your sodium intake to 1,500 mg a day.  Limit alcohol intake to no more than 1 drink a day for nonpregnant women and 2 drinks a day for men. One drink equals 12 oz of beer, 5 oz of wine, or 1 oz of hard liquor.  Work with your health care provider to maintain a healthy body weight or to lose weight. Ask what an ideal weight is for you.  Get at least 30 minutes of exercise that causes your heart to beat faster (aerobic exercise) most days of the week. Activities may include walking, swimming, or biking.  Work with your health care provider or diet and nutrition specialist (dietitian) to adjust your eating plan to your individual calorie  needs. Reading food labels  Check food labels for the amount of sodium per serving. Choose foods with less than 5 percent of the Daily Value of sodium. Generally, foods with less than 300 mg of sodium per serving fit into this eating plan.  To find whole grains, look for the word "whole" as the first word in the ingredient list. Shopping  Buy products labeled as "low-sodium" or "no salt added."  Buy fresh foods. Avoid canned foods and premade or frozen meals. Cooking  Avoid adding salt when cooking. Use salt-free seasonings or herbs instead of table salt or sea salt. Check with your health care provider or pharmacist before using salt substitutes.  Do not fry foods. Cook foods using healthy methods such as baking, boiling, grilling, and broiling instead.  Cook with heart-healthy oils, such as olive, canola, soybean, or sunflower oil. Meal planning   Eat a balanced diet that includes: ? 5 or more servings of fruits and vegetables each day. At each meal, try to fill half of your plate with fruits and vegetables. ? Up to 6-8 servings of whole grains each day. ? Less than 6 oz of lean meat, poultry, or fish each day. A 3-oz serving of meat is about the same size as a deck of cards. One egg equals 1 oz. ? 2 servings of low-fat dairy each day. ? A serving of nuts, seeds, or beans 5 times each week. ? Heart-healthy fats. Healthy fats called Omega-3  fatty acids are found in foods such as flaxseeds and coldwater fish, like sardines, salmon, and mackerel.  Limit how much you eat of the following: ? Canned or prepackaged foods. ? Food that is high in trans fat, such as fried foods. ? Food that is high in saturated fat, such as fatty meat. ? Sweets, desserts, sugary drinks, and other foods with added sugar. ? Full-fat dairy products.  Do not salt foods before eating.  Try to eat at least 2 vegetarian meals each week.  Eat more home-cooked food and less restaurant, buffet, and fast  food.  When eating at a restaurant, ask that your food be prepared with less salt or no salt, if possible. What foods are recommended? The items listed may not be a complete list. Talk with your dietitian about what dietary choices are best for you. Grains Whole-grain or whole-wheat bread. Whole-grain or whole-wheat pasta. Brown rice. Orpah Cobb. Bulgur. Whole-grain and low-sodium cereals. Pita bread. Low-fat, low-sodium crackers. Whole-wheat flour tortillas. Vegetables Fresh or frozen vegetables (raw, steamed, roasted, or grilled). Low-sodium or reduced-sodium tomato and vegetable juice. Low-sodium or reduced-sodium tomato sauce and tomato paste. Low-sodium or reduced-sodium canned vegetables. Fruits All fresh, dried, or frozen fruit. Canned fruit in natural juice (without added sugar). Meat and other protein foods Skinless chicken or [place]. Ground chicken or [place]. Pork with fat trimmed off. Fish and seafood. Egg whites. Dried beans, peas, or lentils. Unsalted nuts, nut butters, and seeds. Unsalted canned beans. Lean cuts of beef with fat trimmed off. Low-sodium, lean deli meat. Dairy Low-fat (1%) or fat-free (skim) milk. Fat-free, low-fat, or reduced-fat cheeses. Nonfat, low-sodium ricotta or cottage cheese. Low-fat or nonfat yogurt. Low-fat, low-sodium cheese. Fats and oils Soft margarine without trans fats. Vegetable oil. Low-fat, reduced-fat, or light mayonnaise and salad dressings (reduced-sodium). Canola, safflower, olive, soybean, and sunflower oils. Avocado. Seasoning and other foods Herbs. Spices. Seasoning mixes without salt. Unsalted popcorn and pretzels. Fat-free sweets. What foods are not recommended? The items listed may not be a complete list. Talk with your dietitian about what dietary choices are best for you. Grains Baked goods made with fat, such as croissants, muffins, or some breads. Dry pasta or rice meal packs. Vegetables Creamed or fried vegetables. Vegetables  in a cheese sauce. Regular canned vegetables (not low-sodium or reduced-sodium). Regular canned tomato sauce and paste (not low-sodium or reduced-sodium). Regular tomato and vegetable juice (not low-sodium or reduced-sodium). Rosita Fire. Olives. Fruits Canned fruit in a light or heavy syrup. Fried fruit. Fruit in cream or butter sauce. Meat and other protein foods Fatty cuts of meat. Ribs. Fried meat. Tomasa Blase. Sausage. Bologna and other processed lunch meats. Salami. Fatback. Hotdogs. Bratwurst. Salted nuts and seeds. Canned beans with added salt. Canned or smoked fish. Whole eggs or egg yolks. Chicken or [place] with skin. Dairy Whole or 2% milk, cream, and half-and-half. Whole or full-fat cream cheese. Whole-fat or sweetened yogurt. Full-fat cheese. Nondairy creamers. Whipped toppings. Processed cheese and cheese spreads. Fats and oils Butter. Stick margarine. Lard. Shortening. Ghee. Bacon fat. Tropical oils, such as coconut, palm kernel, or palm oil. Seasoning and other foods Salted popcorn and pretzels. Onion salt, garlic salt, seasoned salt, table salt, and sea salt. Worcestershire sauce. Tartar sauce. Barbecue sauce. Teriyaki sauce. Soy sauce, including reduced-sodium. Steak sauce. Canned and packaged gravies. Fish sauce. Oyster sauce. Cocktail sauce. Horseradish that you find on the shelf. Ketchup. Mustard. Meat flavorings and tenderizers. Bouillon cubes. Hot sauce and Tabasco sauce. Premade or packaged marinades. Premade or  packaged taco seasonings. Relishes. Regular salad dressings. Where to find more information:  National Heart, Lung, and Blood Institute: PopSte[employer].iswww.nhlbi.nih.gov  American Heart Association: www.heart.org Summary  The DASH eating plan is a healthy eating plan that has been shown to reduce high blood pressure (hypertension). It may also reduce your risk for type 2 diabetes, heart disease, and stroke.  With the DASH eating plan, you should limit salt (sodium) intake to 2,300 mg a  day. If you have hypertension, you may need to reduce your sodium intake to 1,500 mg a day.  When on the DASH eating plan, aim to eat more fresh fruits and vegetables, whole grains, lean proteins, low-fat dairy, and heart-healthy fats.  Work with your health care provider or diet and nutrition specialist (dietitian) to adjust your eating plan to your individual calorie needs. This information is not intended to replace advice given to you by your health care provider. Make sure you discuss any questions you have with your health care provider. Document Released: 03/26/2011 Document Revised: 03/30/2016 Document Reviewed: 03/30/2016 Elsevier Interactive Patient Education  [employer]2018 Elsevier Inc.

## 2018-01-04 ENCOUNTER — Ambulatory Visit: Payer: Medicaid Other | Admitting: Family Medicine

## 2018-01-19 ENCOUNTER — Other Ambulatory Visit: Payer: Self-pay | Admitting: Nurse Practitioner

## 2018-01-19 DIAGNOSIS — I10 Essential (primary) hypertension: Secondary | ICD-10-CM

## 2018-03-18 ENCOUNTER — Other Ambulatory Visit: Payer: Self-pay | Admitting: Nurse Practitioner

## 2018-03-18 DIAGNOSIS — Z3041 Encounter for surveillance of contraceptive pills: Secondary | ICD-10-CM

## 2018-04-22 ENCOUNTER — Other Ambulatory Visit: Payer: Self-pay | Admitting: Nurse Practitioner

## 2018-04-22 DIAGNOSIS — I10 Essential (primary) hypertension: Secondary | ICD-10-CM

## 2018-06-16 ENCOUNTER — Other Ambulatory Visit: Payer: Self-pay | Admitting: Family Medicine

## 2018-06-16 DIAGNOSIS — Z3041 Encounter for surveillance of contraceptive pills: Secondary | ICD-10-CM

## 2018-06-16 NOTE — Telephone Encounter (Signed)
Refill request for general medication: Garnette Scheuermann  Last office visit: 10/04/2017  Last physical exam: 02/21/2015  Follow-ups on file. 07/11/2018

## 2018-07-11 ENCOUNTER — Ambulatory Visit: Payer: Medicaid Other | Admitting: Family Medicine

## 2018-07-15 ENCOUNTER — Encounter: Payer: Self-pay | Admitting: Family Medicine

## 2018-07-15 ENCOUNTER — Ambulatory Visit (INDEPENDENT_AMBULATORY_CARE_PROVIDER_SITE_OTHER): Payer: Medicaid Other | Admitting: Family Medicine

## 2018-07-15 ENCOUNTER — Other Ambulatory Visit: Payer: Self-pay

## 2018-07-15 DIAGNOSIS — E785 Hyperlipidemia, unspecified: Secondary | ICD-10-CM

## 2018-07-15 DIAGNOSIS — G43009 Migraine without aura, not intractable, without status migrainosus: Secondary | ICD-10-CM | POA: Diagnosis not present

## 2018-07-15 DIAGNOSIS — R000 Tachycardia, unspecified: Secondary | ICD-10-CM

## 2018-07-15 DIAGNOSIS — Z23 Encounter for immunization: Secondary | ICD-10-CM | POA: Diagnosis not present

## 2018-07-15 DIAGNOSIS — I10 Essential (primary) hypertension: Secondary | ICD-10-CM | POA: Diagnosis not present

## 2018-07-15 HISTORY — DX: Hyperlipidemia, unspecified: E78.5

## 2018-07-15 HISTORY — DX: Tachycardia, unspecified: R00.0

## 2018-07-15 MED ORDER — AMLODIPINE BESYLATE 5 MG PO TABS: 5.0000 mg | ORAL_TABLET | Freq: Every day | ORAL | 1 refills | Status: DC

## 2018-07-15 MED ORDER — HYDROCHLOROTHIAZIDE 25 MG PO TABS: 25.0000 mg | ORAL_TABLET | Freq: Every day | ORAL | 1 refills | Status: DC

## 2018-07-15 MED ORDER — ATENOLOL 25 MG PO TABS: 25.0000 mg | ORAL_TABLET | Freq: Every day | ORAL | 1 refills | Status: DC

## 2018-07-15 NOTE — Progress Notes (Signed)
Lori Gross   MRN: [id number]    DOB: 12-30-1995   Date:07/15/2018       Progress Note  Subjective  Chief Complaint  Chief Complaint  Patient presents with  . Medication Refill  . Headache    Past 3 days has been taking Tylenol-temples and frontal   . Hypertension    Headaches  . Migraine    2 episode in the past month    HPI  HTN: since her teenager years, her bp went up at the end of both pregnancies and had to be induced both times. She took Methyldopa throughout pregnancies. She used to take Micardis hctz, but has been off ARB and proteinuria resolved, on Norvasc and HCTZ, bp has been at goal, today it is a little high , however she has a headache and is more stressed secondary to COVID-19 . No chest pain, palpitation, losing weight, pre-pregnancy weight was 240 lbs and was  down to 221.3 lbs but now is 227 lbs   Contraception: taking ocp, cycles are regular now, no significant cramping.   Migraines: she states episodes are about one a week, described as throbbing, usually on top of her head, with photophobia, phonophobia but no nausea or vomiting, taking otc medication prn. She states current episodes started 3 days ago, she has been trying to rest, did not take any medication. She states just stopped drinking sodas last week, she may be in caffeine withdrawal   Morbid obesity: BMI above 35 with other risk factors such as HTN, dyslipidemia. Discussed importance of losing weight. She has been decreasing portion size and is now also avoiding sodas.   Patient Active Problem List   Diagnosis Date Noted  . Tachycardia 07/15/2018  . Dyslipidemia 07/15/2018  . Hypertension, benign 11/20/2015  . Dysmenorrhea 10/08/2014  . 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 Microalbuminuria 10/08/2014  . Obesity (BMI 30.0-34.9) 10/08/2014  . Migraine without aura and responsive to treatment 10/08/2014    Past Surgical History:  Procedure Laterality Date  . NO PAST SURGERIES       Family History  Problem Relation Age of Onset  . Hypertension Mother   . Cancer Mother 83       Common Bile Duct  . Hypertension Sister   . Hypertension Brother   . Diabetes Paternal Grandmother   . Cancer Paternal Grandmother 18       brain cancer  . Diabetes Paternal Grandfather   . Hypertension Father   . Cancer Father 90       kidney cancer    Social History   Socioeconomic History  . Marital status: Single    Spouse name: Not on file  . Number of children: 2  . Years of education: Not on file  . Highest education level: High school graduate  Occupational History  . Not on file  Social Needs  . Financial resource strain: Not hard at all  . Food insecurity:    Worry: Never true    Inability: Never true  . Transportation needs:    Medical: No    Non-medical: No  Tobacco Use  . Smoking status: Never Smoker  . Smokeless tobacco: Never Used  Substance and Sexual Activity  . Alcohol use: No    Alcohol/week: 0.0 standard drinks  . Drug use: No  . Sexual activity: Yes    Partners: Male    Birth control/protection: None, Pill  Lifestyle  . Physical activity:    Days per week: 0 days  Minutes per session: 0 min  . Stress: Not at all  Relationships  . Social connections:    Talks on phone: Once a week    Gets together: Once a week    Attends religious service: Never    Active member of club or organization: No    Attends meetings of clubs or organizations: Never    Relationship status: Living with partner  . Intimate partner violence:    Fear of current or ex partner: No    Emotionally abused: No    Physically abused: No    Forced sexual activity: No  Other Topics Concern  . Not on file  Social History Narrative   Dating for 5 years Lisbeth Ply ) they have  a baby girl - Rodman Pickle on July 21st, 2017 and the are  living together   Working full time at [employer] full time.      Current Outpatient Medications:  .  amLODipine (NORVASC) 5 MG  tablet, Take 1 tablet (5 mg total) by mouth daily., Disp: 90 tablet, Rfl: 1 .  hydrochlorothiazide (HYDRODIURIL) 25 MG tablet, Take 1 tablet (25 mg total) by mouth daily., Disp: 90 tablet, Rfl: 1 .  KARIVA 0.15-0.02/0.01 MG (21/5) tablet, TAKE 1 TABLET BY MOUTH EVERYDAY AT BEDTIME, Disp: 28 tablet, Rfl: 0 .  atenolol (TENORMIN) 25 MG tablet, Take 1 tablet (25 mg total) by mouth daily., Disp: 90 tablet, Rfl: 1  No Known Allergies  I personally reviewed active problem list, medication list, allergies, family history, social history with the patient/caregiver today.   ROS  Constitutional: Negative for fever or weight change.  Respiratory: Negative for cough and shortness of breath.   Cardiovascular: Negative for chest pain or palpitations.  Gastrointestinal: Negative for abdominal pain, no bowel changes.  Musculoskeletal: Negative for gait problem or joint swelling.  Skin: Negative for rash.  Neurological: Negative for dizziness or headache.  No other specific complaints in a complete review of systems (except as listed in HPI above).  Objective  Vitals:   07/15/18 0900  BP: (!) 142/84  Pulse: (!) 104  Resp: 18  Temp: 98.4 F (36.9 C)  TempSrc: Oral  SpO2: 98%  Weight: 227 lb 11.2 oz (103.3 kg)  Height: 5\' 4"  (1.626 m)    Body mass index is 39.08 kg/m.  Physical Exam  Constitutional: Patient appears well-developed and well-nourished. Obese No distress.  HEENT: head atraumatic, normocephalic, pupils equal and reactive to light,neck supple, throat within normal limits Cardiovascular: Normal rate, regular rhythm and normal heart sounds.  No murmur heard. No BLE edema. Pulmonary/Chest: Effort normal and breath sounds normal. No respiratory distress. Abdominal: Soft.  There is no tenderness. Psychiatric: Patient has a normal mood and affect. behavior is normal. Judgment and thought content normal.  PHQ2/9: Depression screen [facility] 2/9 07/15/2018 10/04/2017 11/20/2015 06/14/2015  02/21/2015  Decreased Interest 0 0 0 0 0  Down, Depressed, Hopeless 0 0 0 0 0  PHQ - 2 Score 0 0 0 0 0  Altered sleeping 0 - - - -  Tired, decreased energy 1 - - - -  Change in appetite 0 - - - -  Feeling bad or failure about yourself  0 - - - -  Trouble concentrating 0 - - - -  Moving slowly or fidgety/restless 0 - - - -  Suicidal thoughts 0 - - - -  PHQ-9 Score 1 - - - -  Difficult doing work/chores Not difficult at all - - - -  phq 9 is positive and negative   Fall Risk: Fall Risk  07/15/2018 10/04/2017 11/20/2015 06/14/2015 02/21/2015  Falls in the past year? 0 No No No No  Number falls in past yr: 0 - - - -  Injury with Fall? 0 - - - -    Functional Status Survey: Is the patient deaf or have difficulty hearing?: No Does the patient have difficulty seeing, even when wearing glasses/contacts?: No Does the patient have difficulty concentrating, remembering, or making decisions?: No Does the patient have difficulty walking or climbing stairs?: No Does the patient have difficulty dressing or bathing?: No Does the patient have difficulty doing errands alone such as visiting a doctor's office or shopping?: No   Assessment & Plan  1. Hypertension, benign  - COMPLETE METABOLIC PANEL WITH GFR - CBC with Differential/Platelet - amLODipine (NORVASC) 5 MG tablet; Take 1 tablet (5 mg total) by mouth daily.  Dispense: 90 tablet; Refill: 1 - hydrochlorothiazide (HYDRODIURIL) 25 MG tablet; Take 1 tablet (25 mg total) by mouth daily.  Dispense: 90 tablet; Refill: 1  2. Morbid obesity (HCC)  Discussed with the patient the risk posed by an increased BMI. Discussed importance of portion control, calorie counting and at least 150 minutes of physical activity weekly. Avoid sweet beverages and drink more water. Eat at least 6 servings of fruit and vegetables daily   3. Dyslipidemia  - Lipid panel  4. Migraine without aura and responsive to treatment  She will try tylenol today and rest,  also discussed increase in fluid intake  5. Needs flu shot  - Flu Vaccine QUAD 6+ mos PF IM (Fluarix Quad PF)    6. Tachycardia  - atenolol (TENORMIN) 25 MG tablet; Take 1 tablet (25 mg total) by mouth daily.  Dispense: 90 tablet; Refill: 1

## 2018-07-16 ENCOUNTER — Other Ambulatory Visit: Payer: Self-pay | Admitting: Family Medicine

## 2018-07-16 DIAGNOSIS — Z3041 Encounter for surveillance of contraceptive pills: Secondary | ICD-10-CM

## 2018-07-16 LAB — CBC WITH DIFFERENTIAL/PLATELET
Absolute Monocytes: 279 cells/uL (ref 200–950)
Basophils Absolute: 27 cells/uL (ref 0–200)
Basophils Relative: 0.4 %
Eosinophils Absolute: 68 cells/uL (ref 15–500)
Eosinophils Relative: 1 %
HCT: 35.1 % (ref 35.0–45.0)
Hemoglobin: 12 g/dL (ref 11.7–15.5)
Lymphs Abs: 1843 cells/uL (ref 850–3900)
MCH: 33.1 pg — ABNORMAL HIGH (ref 27.0–33.0)
MCHC: 34.2 g/dL (ref 32.0–36.0)
MCV: 97 fL (ref 80.0–100.0)
MPV: 10.6 fL (ref 7.5–12.5)
Monocytes Relative: 4.1 %
NEUTROS PCT: 67.4 %
Neutro Abs: 4583 cells/uL (ref 1500–7800)
PLATELETS: 265 10*3/uL (ref 140–400)
RBC: 3.62 10*6/uL — ABNORMAL LOW (ref 3.80–5.10)
RDW: 13 % (ref 11.0–15.0)
Total Lymphocyte: 27.1 %
WBC: 6.8 10*3/uL (ref 3.8–10.8)

## 2018-07-16 LAB — COMPLETE METABOLIC PANEL WITH GFR
AG RATIO: 1.2 (calc) (ref 1.0–2.5)
ALT: 9 U/L (ref 6–29)
AST: 11 U/L (ref 10–30)
Albumin: 3.9 g/dL (ref 3.6–5.1)
Alkaline phosphatase (APISO): 75 U/L (ref 31–125)
BUN: 9 mg/dL (ref 7–25)
CALCIUM: 9.4 mg/dL (ref 8.6–10.2)
CO2: 25 mmol/L (ref 20–32)
CREATININE: 0.81 mg/dL (ref 0.50–1.10)
Chloride: 106 mmol/L (ref 98–110)
GFR, EST AFRICAN AMERICAN: 118 mL/min/{1.73_m2} (ref >=60)
GFR, EST NON AFRICAN AMERICAN: 102 mL/min/{1.73_m2} (ref >=60)
Globulin: 3.2 g/dL (calc) (ref 1.9–3.7)
Glucose, Bld: 90 mg/dL (ref 65–99)
POTASSIUM: 4.3 mmol/L (ref 3.5–5.3)
Sodium: 141 mmol/L (ref 135–146)
TOTAL PROTEIN: 7.1 g/dL (ref 6.1–8.1)
Total Bilirubin: 0.3 mg/dL (ref 0.2–1.2)

## 2018-07-16 LAB — LIPID PANEL
Cholesterol: 210 mg/dL — ABNORMAL HIGH (ref <200)
HDL: 66 mg/dL (ref >=50)
LDL Cholesterol (Calc): 112 mg/dL (calc) — ABNORMAL HIGH
Non-HDL Cholesterol (Calc): 144 mg/dL (calc) — ABNORMAL HIGH (ref <130)
Total CHOL/HDL Ratio: 3.2 (calc) (ref <5.0)
Triglycerides: 203 mg/dL — ABNORMAL HIGH (ref <150)

## 2018-11-15 ENCOUNTER — Other Ambulatory Visit (HOSPITAL_COMMUNITY)
Admission: RE | Admit: 2018-11-15 | Discharge: 2018-11-15 | Disposition: A | Discharge status: Home / Self Care | Payer: Medicaid Other | Source: Ambulatory Visit | Attending: Family Medicine | Admitting: Family Medicine

## 2018-11-15 ENCOUNTER — Encounter: Payer: Self-pay | Admitting: Family Medicine

## 2018-11-15 ENCOUNTER — Ambulatory Visit: Payer: Medicaid Other | Admitting: Family Medicine

## 2018-11-15 ENCOUNTER — Other Ambulatory Visit: Payer: Self-pay

## 2018-11-15 VITALS — BP 132/78 | HR 93 | Temp 97.1°F | Resp 16 | Ht 64.25 in | Wt 201.2 lb

## 2018-11-15 DIAGNOSIS — Z124 Encounter for screening for malignant neoplasm of cervix: Secondary | ICD-10-CM | POA: Insufficient documentation

## 2018-11-15 DIAGNOSIS — Z0000 Encounter for general adult medical examination without abnormal findings: Secondary | ICD-10-CM

## 2018-11-15 DIAGNOSIS — Z113 Encounter for screening for infections with a predominantly sexual mode of transmission: Secondary | ICD-10-CM

## 2018-11-15 DIAGNOSIS — Z01419 Encounter for gynecological examination (general) (routine) without abnormal findings: Secondary | ICD-10-CM

## 2018-11-15 NOTE — Progress Notes (Signed)
Name: Lori Gross   MRN: [id number]    DOB: 08-15-1995   Date:11/15/2018       Progress Note  Subjective  Chief Complaint  Chief Complaint  Patient presents with  . Wel women exam    HPI   Patient presents for annual CPE    Diet: she stopped drinking sodas, drinking only water Exercise: exercise at home twice a day   USPSTF grade A and B recommendations    Office Visit from 11/15/2018 in [facility]  AUDIT-C Score  0     Depression: Phq 9 is  negative Depression screen [facility] 2/9 11/15/2018 07/15/2018 10/04/2017 11/20/2015 06/14/2015  Decreased Interest 0 0 0 0 0  Down, Depressed, Hopeless 0 0 0 0 0  PHQ - 2 Score 0 0 0 0 0  Altered sleeping 1 0 - - -  Tired, decreased energy 0 1 - - -  Change in appetite 1 0 - - -  Feeling bad or failure about yourself  0 0 - - -  Trouble concentrating 0 0 - - -  Moving slowly or fidgety/restless 0 0 - - -  Suicidal thoughts 0 0 - - -  PHQ-9 Score 2 1 - - -  Difficult doing work/chores Not difficult at all Not difficult at all - - -   Hypertension: BP Readings from Last 3 Encounters:  11/15/18 132/78  07/15/18 (!) 142/84  10/04/17 124/80   Obesity: Wt Readings from Last 3 Encounters:  11/15/18 201 lb 3.2 oz (91.3 kg)  07/15/18 227 lb 11.2 oz (103.3 kg)  10/04/17 219 lb 11.2 oz (99.7 kg)   BMI Readings from Last 3 Encounters:  11/15/18 34.27 kg/m  07/15/18 39.08 kg/m  10/04/17 37.71 kg/m    Hep C Screening: today  STD testing and prevention (HIV/chl/gon/syphilis): today  Intimate partner violence: negative screen  Sexual History/Pain during Intercourse: no pain, no vaginal discharge  Menstrual History/LMP/Abnormal Bleeding: regular cycles, on ocp daily  Incontinence Symptoms: none   Advanced Care Planning: A voluntary discussion about advance care planning including the explanation and discussion of advance directives.  Discussed health care proxy and Living will, and the patient was able to identify  a health care proxy as sister - Kirsti Mcalpine .  Patient does not have a living will at present time.   BRCA gene screening: N/A Cervical cancer screening: today   Osteoporosis Screening: discussed high calcium and vitamin D diet  Lipids:  Lab Results  Component Value Date   CHOL 210 (H) 07/15/2018   CHOL 226 (H) 09/03/2017   CHOL 200 (H) 02/19/2015   Lab Results  Component Value Date   HDL 66 07/15/2018   HDL 60 09/03/2017   HDL 61 02/19/2015   Lab Results  Component Value Date   LDLCALC 112 (H) 07/15/2018   LDLCALC 123 (H) 09/03/2017   LDLCALC 111 (H) 02/19/2015   Lab Results  Component Value Date   TRIG 203 (H) 07/15/2018   TRIG 278 (H) 09/03/2017   TRIG 139 02/19/2015   Lab Results  Component Value Date   CHOLHDL 3.2 07/15/2018   CHOLHDL 3.8 09/03/2017   CHOLHDL 3.3 02/19/2015   No results found for: LDLDIRECT  Glucose:  Glucose  Date Value Ref Range Status  09/01/2016 72 65 - 99 mg/dL Final   Glucose, Bld  Date Value Ref Range Status  07/15/2018 90 65 - 99 mg/dL Final    Comment:    .  Fasting reference interval .   09/03/2017 84 65 - 99 mg/dL Final    Comment:    .            Fasting reference interval .   11/07/2015 71 65 - 99 mg/dL Final    Skin cancer: discussed atypical lesions   Patient Active Problem List   Diagnosis Date Noted  . Tachycardia 07/15/2018  . Dyslipidemia 07/15/2018  . Hypertension, benign 11/20/2015  . Dysmenorrhea 10/08/2014  . 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 Microalbuminuria 10/08/2014  . Obesity (BMI 30.0-34.9) 10/08/2014  . Migraine without aura and responsive to treatment 10/08/2014    Past Surgical History:  Procedure Laterality Date  . NO PAST SURGERIES      Family History  Problem Relation Age of Onset  . Hypertension Mother   . Cancer Mother 71       Common Bile Duct  . Hypertension Sister   . Hypertension Brother   . Diabetes Paternal Grandmother   . Cancer Paternal Grandmother  87       brain cancer  . Diabetes Paternal Grandfather   . Hypertension Father   . Cancer Father 64       kidney cancer    Social History   Socioeconomic History  . Marital status: Single    Spouse name: Not on file  . Number of children: 2  . Years of education: Not on file  . Highest education level: High school graduate  Occupational History  . Not on file  Social Needs  . Financial resource strain: Not hard at all  . Food insecurity    Worry: Never true    Inability: Never true  . Transportation needs    Medical: No    Non-medical: No  Tobacco Use  . Smoking status: Never Smoker  . Smokeless tobacco: Never Used  Substance and Sexual Activity  . Alcohol use: No    Alcohol/week: 0.0 standard drinks  . Drug use: No  . Sexual activity: Yes    Partners: Male    Birth control/protection: Pill  Lifestyle  . Physical activity    Days per week: 5 days    Minutes per session: 30 min  . Stress: Not at all  Relationships  . Social Herbalist on phone: Once a week    Gets together: Once a week    Attends religious service: Never    Active member of club or organization: No    Attends meetings of clubs or organizations: Never    Relationship status: Living with partner  . Intimate partner violence    Fear of current or ex partner: No    Emotionally abused: No    Physically abused: No    Forced sexual activity: No  Other Topics Concern  . Not on file  Social History Narrative   Dating for 5 years Rolan Bucco ) they have  a baby girl - Primitivo Gauze on July 21st, 2017 and the are  living together   Working full time at [employer] full time.      Current Outpatient Medications:  .  amLODipine (NORVASC) 5 MG tablet, Take 1 tablet (5 mg total) by mouth daily., Disp: 90 tablet, Rfl: 1 .  atenolol (TENORMIN) 25 MG tablet, Take 1 tablet (25 mg total) by mouth daily., Disp: 90 tablet, Rfl: 1 .  hydrochlorothiazide (HYDRODIURIL) 25 MG tablet, Take 1 tablet (25  mg total) by mouth daily., Disp: 90 tablet, Rfl: 1 .  KARIVA 0.15-0.02/0.01 MG (21/5) tablet, TAKE 1 TABLET BY MOUTH EVERYDAY AT BEDTIME, Disp: 28 tablet, Rfl: 5  No Known Allergies   ROS  Constitutional: Negative for fever or weight change.  Respiratory: Negative for cough and shortness of breath.   Cardiovascular: Negative for chest pain or palpitations.  Gastrointestinal: Negative for abdominal pain, no bowel changes.  Musculoskeletal: Negative for gait problem or joint swelling.  Skin: Negative for rash.  Neurological: Negative for dizziness or headache.  No other specific complaints in a complete review of systems (except as listed in HPI above).  Objective  Vitals:   11/15/18 1534  BP: 132/78  Pulse: 93  Resp: 16  Temp: (!) 97.1 F (36.2 C)  TempSrc: Temporal  SpO2: 99%  Weight: 201 lb 3.2 oz (91.3 kg)  Height: 5' 4.25" (1.632 m)    Body mass index is 34.27 kg/m.  Physical Exam  Constitutional: Patient appears well-developed and well-nourished. No distress.  HENT: Head: Normocephalic and atraumatic. Ears: B TMs ok, no erythema or effusion; Nose: Nose normal. Mouth/Throat: Oropharynx is clear and moist. No oropharyngeal exudate.  Eyes: Conjunctivae and EOM are normal. Pupils are equal, round, and reactive to light. No scleral icterus.  Neck: Normal range of motion. Neck supple. No JVD present. No thyromegaly present.  Cardiovascular: Normal rate, regular rhythm and normal heart sounds.  No murmur heard. No BLE edema. Pulmonary/Chest: Effort normal and breath sounds normal. No respiratory distress. Abdominal: Soft. Bowel sounds are normal, no distension. There is no tenderness. no masses Breast: no lumps or masses, no nipple discharge or rashes FEMALE GENITALIA:  External genitalia normal External urethra normal Vaginal vault normal without discharge or lesions Cervix normal without discharge or lesions Bimanual exam normal without masses RECTAL:not done   Musculoskeletal: Normal range of motion, no joint effusions. No gross deformities Neurological: he is alert and oriented to person, place, and time. No cranial nerve deficit. Coordination, balance, strength, speech and gait are normal.  Skin: Skin is warm and dry. No rash noted. No erythema.  Psychiatric: Patient has a normal mood and affect. behavior is normal. Judgment and thought content normal.    Fall Risk: Fall Risk  11/15/2018 07/15/2018 10/04/2017 11/20/2015 06/14/2015  Falls in the past year? 0 0 No No No  Number falls in past yr: 0 0 - - -  Injury with Fall? 0 0 - - -     Functional Status Survey: Is the patient deaf or have difficulty hearing?: No Does the patient have difficulty seeing, even when wearing glasses/contacts?: No Does the patient have difficulty concentrating, remembering, or making decisions?: No Does the patient have difficulty walking or climbing stairs?: No Does the patient have difficulty dressing or bathing?: No Does the patient have difficulty doing errands alone such as visiting a doctor's office or shopping?: No   Assessment & Plan  1. Cervical cancer screening  - Cytology - PAP  2. Well woman exam  - Cytology - PAP  3. Routine screening for STI (sexually transmitted infection)  - Cytology - PAP - RPR - Hepatitis panel, acute - HIV Antibody (routine testing w rflx)  -USPSTF grade A and B recommendations reviewed with patient; age-appropriate recommendations, preventive care, screening tests, etc discussed and encouraged; healthy living encouraged; see AVS for patient education given to patient -Discussed importance of 150 minutes of physical activity weekly, eat two servings of fish weekly, eat one serving of tree nuts ( cashews, pistachios, pecans, almonds.Marland Kitchen) every other day, eat 6 servings of  fruit/vegetables daily and drink plenty of water and avoid sweet beverages.

## 2018-11-16 LAB — HIV ANTIBODY (ROUTINE TESTING W REFLEX): HIV 1&2 Ab, 4th Generation: NONREACTIVE

## 2018-11-16 LAB — HEPATITIS PANEL, ACUTE
Hep A IgM: NONREACTIVE
Hep B C IgM: NONREACTIVE
Hepatitis B Surface Ag: NONREACTIVE
Hepatitis C Ab: NONREACTIVE
SIGNAL TO CUT-OFF: 0 (ref <1.00)

## 2018-11-16 LAB — RPR: RPR Ser Ql: NONREACTIVE

## 2018-11-21 LAB — CYTOLOGY - PAP
Chlamydia: NEGATIVE
Neisseria Gonorrhea: NEGATIVE

## 2018-11-22 ENCOUNTER — Other Ambulatory Visit: Payer: Self-pay | Admitting: Family Medicine

## 2018-11-22 DIAGNOSIS — R87619 Unspecified abnormal cytological findings in specimens from cervix uteri: Secondary | ICD-10-CM

## 2018-11-29 ENCOUNTER — Encounter: Payer: Self-pay | Admitting: Obstetrics and Gynecology

## 2018-11-29 ENCOUNTER — Ambulatory Visit (INDEPENDENT_AMBULATORY_CARE_PROVIDER_SITE_OTHER): Payer: Medicaid Other | Admitting: Obstetrics and Gynecology

## 2018-11-29 ENCOUNTER — Other Ambulatory Visit: Payer: Self-pay

## 2018-11-29 ENCOUNTER — Other Ambulatory Visit (HOSPITAL_COMMUNITY)
Admission: RE | Admit: 2018-11-29 | Discharge: 2018-11-29 | Disposition: A | Discharge status: Home / Self Care | Payer: Medicaid Other | Source: Ambulatory Visit | Attending: Obstetrics and Gynecology | Admitting: Obstetrics and Gynecology

## 2018-11-29 VITALS — BP 126/79 | HR 91 | Ht 64.25 in | Wt 196.0 lb

## 2018-11-29 DIAGNOSIS — N72 Inflammatory disease of cervix uteri: Secondary | ICD-10-CM | POA: Diagnosis not present

## 2018-11-29 DIAGNOSIS — R87619 Unspecified abnormal cytological findings in specimens from cervix uteri: Secondary | ICD-10-CM | POA: Insufficient documentation

## 2018-11-29 NOTE — Progress Notes (Signed)
HPI:  Lori Gross is a 23 y.o.  [phone]  who presents today for evaluation and management of abnormal cervical cytology.    Dysplasia History:  AGUS ROS:  Pertinent items are noted in HPI.  OB History  Gravida Para Term Preterm AB Living  2 2 2  0 0 2  SAB TAB Ectopic Multiple Live Births  0   0 0 2    # Outcome Date GA Lbr Len/2nd Weight Sex Delivery Anes PTL Lv  2 Term 02/26/17 4754w0d / 00:17 8 lb 3.2 oz (3.72 kg) M Vag-Spont EPI  LIV  1 Term 11/08/15 4986w0d / 01:44 6 lb 13 oz (3.09 kg) F Vag-Spont EPI  LIV     Complications: Chronic hypertension affecting pregnancy    Obstetric Comments  Menstrual age: [age]    Age 1st Pregnancy: [age]    Past Medical History:  Diagnosis Date  . Benign headache   . Dysmenorrhea   . Grieving   . Hypertension   . Marijuana use 2016   positive UDS  . Microalbuminuria   . Migraine without aura and without status migrainosus, not intractable   . Obesity   . Urticaria     Past Surgical History:  Procedure Laterality Date  . NO PAST SURGERIES      SOCIAL HISTORY: Social History   Substance and Sexual Activity  Alcohol Use No  . Alcohol/week: 0.0 standard drinks   Social History   Substance and Sexual Activity  Drug Use No     Family History  Problem Relation Age of Onset  . Hypertension Mother   . Cancer Mother [phone]       Common Bile Duct  . Hypertension Sister   . Hypertension Brother   . Diabetes Paternal Grandmother   . Cancer Paternal Grandmother [phone]       brain cancer  . Diabetes Paternal Grandfather   . Hypertension Father   . Cancer Father [phone]       kidney cancer    ALLERGIES:  Patient has no known allergies.  She has a current medication list which includes the following prescription(s): amlodipine, atenolol, hydrochlorothiazide, and kariva.  Physical Exam: -Vitals:  BP 126/79   Pulse 91   Ht 5' 4.25" (1.632 m)   Wt 196 lb (88.9 kg)   LMP 11/07/2018   BMI 33.38 kg/m    PROCEDURE: 1.  Urine  Pregnancy Test:  not done 2.  Colposcopy performed with 4% acetic acid after verbal consent obtained                           -Aceto-white Lesions Location(s): None              -Biopsy performed at 7 o'clock (large glandular appearing cervical ectropion noted         circumferentially.              -ECC indicated and performed: Yes.       -Biopsy sites made hemostatic with pressure and Monsel's solution   -Satisfactory colposcopy: Yes.      -Evidence of Invasive cervical CA :  NO  ASSESSMENT:  Lori Gross is a 23 y.o. [phone] here for  1. 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  PLAN: 1.  I discussed the grading system of pap smears and HPV high risk viral types.  We specifically discussed AGUS in detail compared to  ASCUS.  We will discuss management after colpo results return.  No orders of the defined types were placed in this encounter.          F/U  Return in about 2 weeks (around 12/13/2018) for Colpo f/u. I spent 18 minutes involved in the care of this patient of which greater than 50% was spent discussing abnormal Pap smears, AGUS versus ASCUS.  Necessary testing and follow-up.  Need for colposcopy.  All questions answered.  Jeannie Fend ,MD 11/29/2018,4:43 PM

## 2018-11-29 NOTE — Progress Notes (Signed)
Patient comes In today for new patient appointment. She has been referred for an abnormal PAP. Pap is in Epic.

## 2018-12-14 ENCOUNTER — Encounter: Payer: Self-pay | Admitting: Obstetrics and Gynecology

## 2018-12-14 ENCOUNTER — Other Ambulatory Visit: Payer: Self-pay

## 2018-12-14 ENCOUNTER — Ambulatory Visit (INDEPENDENT_AMBULATORY_CARE_PROVIDER_SITE_OTHER): Payer: Medicaid Other | Admitting: Obstetrics and Gynecology

## 2018-12-14 VITALS — BP 126/79 | HR 84 | Wt 195.0 lb

## 2018-12-14 DIAGNOSIS — R87619 Unspecified abnormal cytological findings in specimens from cervix uteri: Secondary | ICD-10-CM

## 2018-12-14 NOTE — Progress Notes (Signed)
HPI:      [name] is a 23 y.o. [phone] who LMP was No LMP recorded. (Menstrual status: Oral contraceptives).  Subjective:   She presents today for follow-up from her colposcopy.  She previously had a Pap smear showing AGUS and underwent colposcopy.  An external cervical biopsy of glandular epithelium was performed as well as an ECC.    Hx: The following portions of the patient's history were reviewed and updated as appropriate:             She  has a past medical history of Benign headache, Dysmenorrhea, Grieving, Hypertension, Marijuana use (2016), Microalbuminuria, Migraine without aura and without status migrainosus, not intractable, Obesity, and Urticaria. She does not have any pertinent problems on file. She  has a past surgical history that includes No past surgeries. Her family history includes Cancer (age of onset: 8047) in her mother; Cancer (age of onset: 3950) in her father; Cancer (age of onset: 5474) in her paternal grandmother; Diabetes in her paternal grandfather and paternal grandmother; Hypertension in her brother, father, mother, and sister. She  reports that she has never smoked. She has never used smokeless tobacco. She reports that she does not drink alcohol or use drugs. She has a current medication list which includes the following prescription(s): amlodipine, atenolol, hydrochlorothiazide, and kariva. She has No Known Allergies.       Review of Systems:  Review of Systems  Constitutional: Denied constitutional symptoms, night sweats, recent illness, fatigue, fever, insomnia and weight loss.  Eyes: Denied eye symptoms, eye pain, photophobia, vision change and visual disturbance.  Ears/Nose/Throat/Neck: Denied ear, nose, throat or neck symptoms, hearing loss, nasal discharge, sinus congestion and sore throat.  Cardiovascular: Denied cardiovascular symptoms, arrhythmia, chest pain/pressure, edema, exercise intolerance, orthopnea and palpitations.  Respiratory:  Denied pulmonary symptoms, asthma, pleuritic pain, productive sputum, cough, dyspnea and wheezing.  Gastrointestinal: Denied, gastro-esophageal reflux, melena, nausea and vomiting.  Genitourinary: Denied genitourinary symptoms including symptomatic vaginal discharge, pelvic relaxation issues, and urinary complaints.  Musculoskeletal: Denied musculoskeletal symptoms, stiffness, swelling, muscle weakness and myalgia.  Dermatologic: Denied dermatology symptoms, rash and scar.  Neurologic: Denied neurology symptoms, dizziness, headache, neck pain and syncope.  Psychiatric: Denied psychiatric symptoms, anxiety and depression.  Endocrine: Denied endocrine symptoms including hot flashes and night sweats.   Meds:   Current Outpatient Medications on File Prior to Visit  Medication Sig Dispense Refill  . amLODipine (NORVASC) 5 MG tablet Take 1 tablet (5 mg total) by mouth daily. 90 tablet 1  . atenolol (TENORMIN) 25 MG tablet Take 1 tablet (25 mg total) by mouth daily. 90 tablet 1  . hydrochlorothiazide (HYDRODIURIL) 25 MG tablet Take 1 tablet (25 mg total) by mouth daily. 90 tablet 1  . KARIVA 0.15-0.02/0.01 MG (21/5) tablet TAKE 1 TABLET BY MOUTH EVERYDAY AT BEDTIME 28 tablet 5   No current facility-administered medications on file prior to visit.     Objective:     Vitals:   12/14/18 0851  BP: 126/79  Pulse: 84              Colposcopy results were reviewed in detail with the patient which showed no abnormality of the external biopsy core of the ECC.  Assessment:    [id number] Patient Active Problem List   Diagnosis Date Noted  . Tachycardia 07/15/2018  . Dyslipidemia 07/15/2018  . Hypertension, benign 11/20/2015  . Dysmenorrhea 10/08/2014  . 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 Microalbuminuria 10/08/2014  . Obesity (BMI 30.0-34.9) 10/08/2014  .  Migraine without aura and responsive to treatment 10/08/2014     1. 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   Plan:            1.  Because of the discrepancy between Pap smear and colposcopy and the high risk nature of atypical glandular cells I recommended a follow-up Pap smear in 3 months.  We will do an aggressive exo and endo cervical Pap.  If that Pap is normal then I would begin routine annual screening.  If that Pap remains abnormal with atypical glandular cells will need to repeat colposcopy possibly perform endometrial biopsy.    Orders No orders of the defined types were placed in this encounter.   No orders of the defined types were placed in this encounter.     F/U  Return in about 3 months (around 03/16/2019). I spent 17 minutes involved in the care of this patient of which greater than 50% was spent discussing findings at colposcopy, ASCUS versus AGUS Pap smears, scheduled and recommended follow-up, future work-up if necessary.  All questions answered.  Finis Bud, M.D. 12/14/2018 9:27 AM

## 2018-12-14 NOTE — Progress Notes (Signed)
Patient comes in today for colposcopy results.  

## 2018-12-23 ENCOUNTER — Other Ambulatory Visit: Payer: Self-pay | Admitting: Family Medicine

## 2018-12-23 DIAGNOSIS — Z3041 Encounter for surveillance of contraceptive pills: Secondary | ICD-10-CM

## 2019-01-02 ENCOUNTER — Other Ambulatory Visit: Payer: Self-pay | Admitting: Family Medicine

## 2019-01-02 DIAGNOSIS — G43009 Migraine without aura, not intractable, without status migrainosus: Secondary | ICD-10-CM

## 2019-01-02 DIAGNOSIS — I10 Essential (primary) hypertension: Secondary | ICD-10-CM

## 2019-01-02 DIAGNOSIS — R000 Tachycardia, unspecified: Secondary | ICD-10-CM

## 2019-01-16 ENCOUNTER — Other Ambulatory Visit: Payer: Self-pay

## 2019-01-16 ENCOUNTER — Ambulatory Visit: Payer: Medicaid Other | Admitting: Family Medicine

## 2019-01-16 ENCOUNTER — Encounter: Payer: Self-pay | Admitting: Family Medicine

## 2019-01-16 VITALS — BP 118/68 | HR 100 | Temp 97.6°F | Resp 16 | Ht 64.25 in | Wt 183.5 lb

## 2019-01-16 DIAGNOSIS — Z23 Encounter for immunization: Secondary | ICD-10-CM

## 2019-01-16 DIAGNOSIS — G43009 Migraine without aura, not intractable, without status migrainosus: Secondary | ICD-10-CM | POA: Diagnosis not present

## 2019-01-16 DIAGNOSIS — I10 Essential (primary) hypertension: Secondary | ICD-10-CM | POA: Diagnosis not present

## 2019-01-16 DIAGNOSIS — R87619 Unspecified abnormal cytological findings in specimens from cervix uteri: Secondary | ICD-10-CM

## 2019-01-16 DIAGNOSIS — R000 Tachycardia, unspecified: Secondary | ICD-10-CM | POA: Diagnosis not present

## 2019-01-16 NOTE — Progress Notes (Signed)
Name: Lori Gross   MRN: [id number]    DOB: 1996-04-01   Date:01/16/2019       Progress Note  Subjective  Chief Complaint  Chief Complaint  Patient presents with  . Medication Refill  . Hypertension    States she quit taking the Amlodipine because it was dropping her BP and Pulse too. Denies any symptoms  . Migraine    Has not had one in awhile    HPI  Abnormal pap smear: showed glandular cells, seen by gyn and colposcopy was negative so she is going back for another pap smear in Dec. She denies any problems  Obesity: she has lost over 20 lbs since July, she is trying to eat healthier and is happy with results. Her goal is to get down to 160 lbs.   Tachycardia: she used to take Atenolol but stopped because heart rate was down to 60's and bp was low, she denies any palpitation, and states heart rate around 80 when she donates plasma twice a week  Migraine: no episodes. Explained that it may recur since she stopped taking Atenolol and advised her to monitor   HTN: bp is at goal, tolerating norvasc 5 mg and hctz, she took self off atenolol because bp was dropping. No longer getting dizzy, denies chest pain or palpitation   Patient Active Problem List   Diagnosis Date Noted  . Tachycardia 07/15/2018  . Dyslipidemia 07/15/2018  . Hypertension, benign 11/20/2015  . Dysmenorrhea 10/08/2014  . 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 Microalbuminuria 10/08/2014  . Obesity (BMI 30.0-34.9) 10/08/2014  . Migraine without aura and responsive to treatment 10/08/2014    Past Surgical History:  Procedure Laterality Date  . NO PAST SURGERIES      Family History  Problem Relation Age of Onset  . Hypertension Mother   . Cancer Mother 16       Common Bile Duct  . Hypertension Sister   . Hypertension Brother   . Diabetes Paternal Grandmother   . Cancer Paternal Grandmother 92       brain cancer  . Diabetes Paternal Grandfather   . Hypertension Father   . Cancer Father 75   kidney cancer    Social History   Socioeconomic History  . Marital status: Single    Spouse name: Not on file  . Number of children: 2  . Years of education: Not on file  . Highest education level: High school graduate  Occupational History  . Not on file  Social Needs  . Financial resource strain: Not hard at all  . Food insecurity    Worry: Never true    Inability: Never true  . Transportation needs    Medical: No    Non-medical: No  Tobacco Use  . Smoking status: Never Smoker  . Smokeless tobacco: Never Used  Substance and Sexual Activity  . Alcohol use: No    Alcohol/week: 0.0 standard drinks  . Drug use: No  . Sexual activity: Yes    Partners: Male    Birth control/protection: Pill  Lifestyle  . Physical activity    Days per week: 5 days    Minutes per session: 30 min  . Stress: Not at all  Relationships  . Social Musician on phone: Once a week    Gets together: Once a week    Attends religious service: Never    Active member of club or organization: No    Attends meetings of clubs or  organizations: Never    Relationship status: Living with partner  . Intimate partner violence    Fear of current or ex partner: No    Emotionally abused: No    Physically abused: No    Forced sexual activity: No  Other Topics Concern  . Not on file  Social History Narrative   Dating for 5 years Lisbeth Ply( [place] ) they have  a baby girl - Rodman PickleCassidy on July 21st, 2017 and the are  living together   Working full time [employer].      Current Outpatient Medications:  .  amLODipine (NORVASC) 5 MG tablet, TAKE 1 TABLET BY MOUTH EVERY DAY, Disp: 90 tablet, Rfl: 1 .  hydrochlorothiazide (HYDRODIURIL) 25 MG tablet, TAKE 1 TABLET BY MOUTH EVERY DAY, Disp: 90 tablet, Rfl: 1 .  KARIVA 0.15-0.02/0.01 MG (21/5) tablet, TAKE 1 TABLET BY MOUTH EVERYDAY AT BEDTIME, Disp: 28 tablet, Rfl: 5 .  atenolol (TENORMIN) 25 MG tablet, TAKE 1 TABLET BY MOUTH EVERY DAY  (Patient not taking: Reported on 01/16/2019), Disp: 90 tablet, Rfl: 1  No Known Allergies  I personally reviewed active problem list, medication list, allergies, family history, social history with the patient/caregiver today.   ROS  Ten systems reviewed and is negative except as mentioned in HPI   Objective  Vitals:   01/16/19 1553  BP: 118/68  Pulse: 100  Resp: 16  Temp: 97.6 F (36.4 C)  TempSrc: Temporal  SpO2: 98%  Weight: 183 lb 8 oz (83.2 kg)  Height: 5' 4.25" (1.632 m)    Body mass index is 31.25 kg/m.  Physical Exam  Constitutional: Patient appears well-developed and well-nourished. Obese  No distress.  HEENT: head atraumatic, normocephalic, pupils equal and reactive to light Cardiovascular: Normal rate, regular rhythm and normal heart sounds.  No murmur heard. No BLE edema. Pulmonary/Chest: Effort normal and breath sounds normal. No respiratory distress. Abdominal: Soft.  There is no tenderness. Psychiatric: Patient has a normal mood and affect. behavior is normal. Judgment and thought content normal.  Recent Results (from the past 2160 hour(s))  Cytology - PAP     Status: Abnormal   Collection Time: 11/15/18 12:00 AM  Result Value Ref Range   Adequacy (A)     Satisfactory for evaluation  endocervical/transformation zone component PRESENT.   Diagnosis ATYPICAL GLANDULAR CELLS, SEE COMMENT. (A)    Chlamydia Negative     Comment: Normal Reference Range - Negative   Neisseria Gonorrhea Negative     Comment: Normal Reference Range - Negative   Material Submitted CervicoVaginal Pap [ThinPrep Imaged] (A)    CYTOLOGY - PAP PAP RESULT   RPR     Status: None   Collection Time: 11/15/18  4:28 PM  Result Value Ref Range   RPR Ser Ql NON-REACTIVE NON-REACTI  Hepatitis panel, acute     Status: None   Collection Time: 11/15/18  4:28 PM  Result Value Ref Range   Hep A IgM NON-REACTIVE NON-REACTI   Hepatitis B Surface Ag NON-REACTIVE NON-REACTI   Hep B C IgM  NON-REACTIVE NON-REACTI   Hepatitis C Ab NON-REACTIVE NON-REACTI   SIGNAL TO CUT-OFF 0.00 <1.00    Comment: . HCV antibody was non-reactive. There is no laboratory  evidence of HCV infection. . In most cases, no further action is required. However, if recent HCV exposure is suspected, a test for HCV RNA (test code 1610935645) is suggested. . For additional information please refer to http://education.[facility].com/faq/FAQ22v1 (This link is being  provided for informational/ educational purposes only.) . Marland Kitchen For additional information, please refer to  http://education.[facility].com/faq/FAQ202  (This link is being provided for informational/ educational purposes only.) .   HIV Antibody (routine testing w rflx)     Status: None   Collection Time: 11/15/18  4:28 PM  Result Value Ref Range   HIV 1&2 Ab, 4th Generation NON-REACTIVE NON-REACTI    Comment: HIV-1 antigen and HIV-1/HIV-2 antibodies were not detected. There is no laboratory evidence of HIV infection. Marland Kitchen PLEASE NOTE: This information has been disclosed to you from records whose confidentiality may be protected by state law.  If your state requires such protection, then the state law prohibits you from making any further disclosure of the information without the specific written consent of the person to whom it pertains, or as otherwise permitted by law. A general authorization for the release of medical or other information is NOT sufficient for this purpose. . For additional information please refer to http://education.[facility].com/faq/FAQ106 (This link is being provided for informational/ educational purposes only.) . Marland Kitchen The performance of this assay has not been clinically validated in patients less than 39 years old. .       PHQ2/9: Depression screen [facility] 2/9 01/16/2019 11/15/2018 07/15/2018 10/04/2017 11/20/2015  Decreased Interest 0 0 0 0 0  Down, Depressed, Hopeless 0 0 0 0 0  PHQ - 2 Score  0 0 0 0 0  Altered sleeping 0 1 0 - -  Tired, decreased energy 0 0 1 - -  Change in appetite 0 1 0 - -  Feeling bad or failure about yourself  0 0 0 - -  Trouble concentrating 0 0 0 - -  Moving slowly or fidgety/restless 0 0 0 - -  Suicidal thoughts 0 0 0 - -  PHQ-9 Score 0 2 1 - -  Difficult doing work/chores Not difficult at all Not difficult at all Not difficult at all - -    phq 9 is negative   Fall Risk: Fall Risk  01/16/2019 11/15/2018 07/15/2018 10/04/2017 11/20/2015  Falls in the past year? 0 0 0 No No  Number falls in past yr: 0 0 0 - -  Injury with Fall? 0 0 0 - -     Functional Status Survey: Is the patient deaf or have difficulty hearing?: No Does the patient have difficulty seeing, even when wearing glasses/contacts?: No Does the patient have difficulty concentrating, remembering, or making decisions?: No Does the patient have difficulty walking or climbing stairs?: No Does the patient have difficulty dressing or bathing?: No Does the patient have difficulty doing errands alone such as visiting a doctor's office or shopping?: No    Assessment & Plan  1. Hypertension, benign  At goal  2. Need for immunization against influenza  - Flu Vaccine QUAD 36+ mos IM  3. Atypical glandular cells on cervical Pap smear  Keep follow up with Dr. Amalia Hailey  4. Tachycardia  We will monitor, off Atenolol   5. Migraine without aura and responsive to treatment  No symptoms

## 2019-03-21 ENCOUNTER — Other Ambulatory Visit (HOSPITAL_COMMUNITY)
Admission: RE | Admit: 2019-03-21 | Discharge: 2019-03-21 | Disposition: A | Discharge status: Home / Self Care | Payer: Medicaid Other | Source: Ambulatory Visit | Attending: Obstetrics and Gynecology | Admitting: Obstetrics and Gynecology

## 2019-03-21 ENCOUNTER — Other Ambulatory Visit: Payer: Self-pay

## 2019-03-21 ENCOUNTER — Encounter: Payer: Self-pay | Admitting: Obstetrics and Gynecology

## 2019-03-21 ENCOUNTER — Ambulatory Visit (INDEPENDENT_AMBULATORY_CARE_PROVIDER_SITE_OTHER): Payer: Medicaid Other | Admitting: Obstetrics and Gynecology

## 2019-03-21 VITALS — BP 133/84 | HR 74 | Ht 64.0 in | Wt 178.9 lb

## 2019-03-21 DIAGNOSIS — Z3041 Encounter for surveillance of contraceptive pills: Secondary | ICD-10-CM

## 2019-03-21 DIAGNOSIS — R87618 Other abnormal cytological findings on specimens from cervix uteri: Secondary | ICD-10-CM | POA: Diagnosis not present

## 2019-03-21 DIAGNOSIS — Z0000 Encounter for general adult medical examination without abnormal findings: Secondary | ICD-10-CM

## 2019-03-21 DIAGNOSIS — Z01419 Encounter for gynecological examination (general) (routine) without abnormal findings: Secondary | ICD-10-CM | POA: Diagnosis not present

## 2019-03-21 MED ORDER — DESOGESTREL-ETHINYL ESTRADIOL 0.15-0.02/0.01 MG (21/5) PO TABS: 1.0000 | ORAL_TABLET | Freq: Every day | ORAL | 3 refills | Status: DC

## 2019-03-21 NOTE — Progress Notes (Signed)
HPI:      [name] is a 23 y.o. [phone] who LMP was No LMP recorded. (Menstrual status: Oral contraceptives).  Subjective:   She presents today for her annual examination.  She is taking OCPs without problem.  She would like to continue.  She has no complaints. She also needs a Pap performed because she had AGUS on her last Pap but a negative colposcopy after that.    Hx: The following portions of the patient's history were reviewed and updated as appropriate:             She  has a past medical history of Benign headache, Dysmenorrhea, Grieving, Hypertension, Marijuana use (2016), Microalbuminuria, Migraine without aura and without status migrainosus, not intractable, Obesity, and Urticaria. She does not have any pertinent problems on file. She  has a past surgical history that includes No past surgeries. Her family history includes Cancer (age of onset: 24) in her mother; Cancer (age of onset: 59) in her father; Cancer (age of onset: 30) in her paternal grandmother; Diabetes in her paternal grandfather and paternal grandmother; Hypertension in her brother, father, mother, and sister. She  reports that she has never smoked. She has never used smokeless tobacco. She reports that she does not drink alcohol or use drugs. She has a current medication list which includes the following prescription(s): amlodipine, hydrochlorothiazide, and kariva. She has No Known Allergies.       Review of Systems:  Review of Systems  Constitutional: Denied constitutional symptoms, night sweats, recent illness, fatigue, fever, insomnia and weight loss.  Eyes: Denied eye symptoms, eye pain, photophobia, vision change and visual disturbance.  Ears/Nose/Throat/Neck: Denied ear, nose, throat or neck symptoms, hearing loss, nasal discharge, sinus congestion and sore throat.  Cardiovascular: Denied cardiovascular symptoms, arrhythmia, chest pain/pressure, edema, exercise intolerance, orthopnea and  palpitations.  Respiratory: Denied pulmonary symptoms, asthma, pleuritic pain, productive sputum, cough, dyspnea and wheezing.  Gastrointestinal: Denied, gastro-esophageal reflux, melena, nausea and vomiting.  Genitourinary: Denied genitourinary symptoms including symptomatic vaginal discharge, pelvic relaxation issues, and urinary complaints.  Musculoskeletal: Denied musculoskeletal symptoms, stiffness, swelling, muscle weakness and myalgia.  Dermatologic: Denied dermatology symptoms, rash and scar.  Neurologic: Denied neurology symptoms, dizziness, headache, neck pain and syncope.  Psychiatric: Denied psychiatric symptoms, anxiety and depression.  Endocrine: Denied endocrine symptoms including hot flashes and night sweats.   Meds:   Current Outpatient Medications on File Prior to Visit  Medication Sig Dispense Refill  . amLODipine (NORVASC) 5 MG tablet TAKE 1 TABLET BY MOUTH EVERY DAY 90 tablet 1  . hydrochlorothiazide (HYDRODIURIL) 25 MG tablet TAKE 1 TABLET BY MOUTH EVERY DAY 90 tablet 1  . KARIVA 0.15-0.02/0.01 MG (21/5) tablet TAKE 1 TABLET BY MOUTH EVERYDAY AT BEDTIME 28 tablet 5   No current facility-administered medications on file prior to visit.     Objective:     Vitals:   03/21/19 1046  BP: 133/84  Pulse: 74              Physical examination General NAD, Conversant  HEENT Atraumatic; Op clear with mmm.  Normo-cephalic. Pupils reactive. Anicteric sclerae  Thyroid/Neck Smooth without nodularity or enlargement. Normal ROM.  Neck Supple.  Skin No rashes, lesions or ulceration. Normal palpated skin turgor. No nodularity.  Breasts: No masses or discharge.  Symmetric.  No axillary adenopathy.  Lungs: Clear to auscultation.No rales or wheezes. Normal Respiratory effort, no retractions.  Heart: NSR.  No murmurs or rubs appreciated. No periferal edema  Abdomen:  Soft.  Non-tender.  No masses.  No HSM. No hernia  Extremities: Moves all appropriately.  Normal ROM for age. No  lymphadenopathy.  Neuro: Oriented to PPT.  Normal mood. Normal affect.     Pelvic:   Vulva: Normal appearance.  No lesions.  Vagina: No lesions or abnormalities noted.  Support: Normal pelvic support.  Urethra No masses tenderness or scarring.  Meatus Normal size without lesions or prolapse.  Cervix: Normal appearance.  No lesions.  Anus: Normal exam.  No lesions.  Perineum: Normal exam.  No lesions.        Bimanual   Uterus: Normal size.  Non-tender.  Mobile.  AV.  Adnexae: No masses.  Non-tender to palpation.  Cul-de-sac: Negative for abnormality.      Assessment:    [id number] Patient Active Problem List   Diagnosis Date Noted  . Tachycardia 07/15/2018  . Dyslipidemia 07/15/2018  . Hypertension, benign 11/20/2015  . Dysmenorrhea 10/08/2014  . 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 Microalbuminuria 10/08/2014  . Obesity (BMI 30.0-34.9) 10/08/2014  . Migraine without aura and responsive to treatment 10/08/2014     1. Well woman exam with routine gynecological exam   2. Other abnormal cytological finding of specimen from cervix     History of AGUS Pap -repeat Pap performed today   Plan:            1.  Basic Screening Recommendations The basic screening recommendations for asymptomatic women were discussed with the patient during her visit.  The age-appropriate recommendations were discussed with her and the rational for the tests reviewed.  When I am informed by the patient that another primary care physician has previously obtained the age-appropriate tests and they are up-to-date, only outstanding tests are ordered and referrals given as necessary.  Abnormal results of tests will be discussed with her when all of her results are completed.  Routine preventative health maintenance measures emphasized: Exercise/Diet/Weight control, Tobacco Warnings, Alcohol/Substance use risks and Stress Management 2.  Continue OCPs 3.  exo and endocervical specimens sent separately because  of history of AGUS Pap. Orders No orders of the defined types were placed in this encounter.   No orders of the defined types were placed in this encounter.       F/U  Return in about 1 year (around 03/20/2020) for Annual Physical.  Finis Bud, M.D. 03/21/2019 11:09 AM

## 2019-03-21 NOTE — Progress Notes (Signed)
Patient comes today for annual physical. No concerns today. She is due for repeat Pap.

## 2019-03-27 LAB — CYTOLOGY - PAP
Chlamydia: NEGATIVE
Comment: NEGATIVE
Comment: NEGATIVE
Comment: NEGATIVE
Comment: NORMAL
Diagnosis: HIGH — AB
Diagnosis: UNDETERMINED — AB
High risk HPV: NEGATIVE
High risk HPV: NEGATIVE
Neisseria Gonorrhea: NEGATIVE

## 2019-03-28 ENCOUNTER — Telehealth: Payer: Self-pay | Admitting: Obstetrics and Gynecology

## 2019-03-28 NOTE — Telephone Encounter (Signed)
Please advise on results

## 2019-03-28 NOTE — Telephone Encounter (Signed)
Pt is requesting a call from the nurse to go over test results. Please advise

## 2019-03-29 NOTE — Telephone Encounter (Signed)
LM for patient to return call.

## 2019-03-29 NOTE — Telephone Encounter (Signed)
Patient returned call. I told patient I would have Roselyn Reef to call her back.

## 2019-03-29 NOTE — Telephone Encounter (Signed)
Spoke with patient and she had several questions about her pap results. I have scheduled her an appointment to discuss further with Dr. Amalia Hailey for this Friday.

## 2019-03-31 ENCOUNTER — Ambulatory Visit (INDEPENDENT_AMBULATORY_CARE_PROVIDER_SITE_OTHER): Payer: Medicaid Other | Admitting: Obstetrics and Gynecology

## 2019-03-31 ENCOUNTER — Other Ambulatory Visit: Payer: Self-pay

## 2019-03-31 ENCOUNTER — Encounter: Payer: Self-pay | Admitting: Obstetrics and Gynecology

## 2019-03-31 VITALS — BP 138/85 | HR 75 | Ht 64.0 in | Wt 175.7 lb

## 2019-03-31 DIAGNOSIS — R87611 Atypical squamous cells cannot exclude high grade squamous intraepithelial lesion on cytologic smear of cervix (ASC-H): Secondary | ICD-10-CM

## 2019-03-31 NOTE — Progress Notes (Signed)
HPI:      [name] is a 23 y.o. [phone] who LMP was No LMP recorded. (Menstrual status: Oral contraceptives).  Subjective:   She presents today for follow-up of her colposcopy.  She had questions regarding the findings.  It was at her request this appointment was made after she read her findings on my chart.    Hx: The following portions of the patient's history were reviewed and updated as appropriate:             She  has a past medical history of Benign headache, Dysmenorrhea, Grieving, Hypertension, Marijuana use (2016), Microalbuminuria, Migraine without aura and without status migrainosus, not intractable, Obesity, and Urticaria. She does not have any pertinent problems on file. She  has a past surgical history that includes No past surgeries. Her family history includes Cancer (age of onset: 7) in her mother; Cancer (age of onset: 40) in her father; Cancer (age of onset: 44) in her paternal grandmother; Diabetes in her paternal grandfather and paternal grandmother; Hypertension in her brother, father, mother, and sister. She  reports that she has never smoked. She has never used smokeless tobacco. She reports that she does not drink alcohol or use drugs. She has a current medication list which includes the following prescription(s): amlodipine, desogestrel-ethinyl estradiol, and hydrochlorothiazide. She has No Known Allergies.       Review of Systems:  Review of Systems  Constitutional: Denied constitutional symptoms, night sweats, recent illness, fatigue, fever, insomnia and weight loss.  Eyes: Denied eye symptoms, eye pain, photophobia, vision change and visual disturbance.  Ears/Nose/Throat/Neck: Denied ear, nose, throat or neck symptoms, hearing loss, nasal discharge, sinus congestion and sore throat.  Cardiovascular: Denied cardiovascular symptoms, arrhythmia, chest pain/pressure, edema, exercise intolerance, orthopnea and palpitations.  Respiratory: Denied  pulmonary symptoms, asthma, pleuritic pain, productive sputum, cough, dyspnea and wheezing.  Gastrointestinal: Denied, gastro-esophageal reflux, melena, nausea and vomiting.  Genitourinary: Denied genitourinary symptoms including symptomatic vaginal discharge, pelvic relaxation issues, and urinary complaints.  Musculoskeletal: Denied musculoskeletal symptoms, stiffness, swelling, muscle weakness and myalgia.  Dermatologic: Denied dermatology symptoms, rash and scar.  Neurologic: Denied neurology symptoms, dizziness, headache, neck pain and syncope.  Psychiatric: Denied psychiatric symptoms, anxiety and depression.  Endocrine: Denied endocrine symptoms including hot flashes and night sweats.   Meds:   Current Outpatient Medications on File Prior to Visit  Medication Sig Dispense Refill  . amLODipine (NORVASC) 5 MG tablet TAKE 1 TABLET BY MOUTH EVERY DAY 90 tablet 1  . desogestrel-ethinyl estradiol (KARIVA) 0.15-0.02/0.01 MG (21/5) tablet Take 1 tablet by mouth daily. 3 Package 3  . hydrochlorothiazide (HYDRODIURIL) 25 MG tablet TAKE 1 TABLET BY MOUTH EVERY DAY 90 tablet 1   No current facility-administered medications on file prior to visit.    Objective:     Vitals:   03/31/19 1054  BP: 138/85  Pulse: 75              Colposcopy and Pap smear results reviewed directly with the patient  Assessment:    [id number] Patient Active Problem List   Diagnosis Date Noted  . Tachycardia 07/15/2018  . Dyslipidemia 07/15/2018  . Hypertension, benign 11/20/2015  . Dysmenorrhea 10/08/2014  . 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 Microalbuminuria 10/08/2014  . Obesity (BMI 30.0-34.9) 10/08/2014  . Migraine without aura and responsive to treatment 10/08/2014     1. Atypical squamous cells cannot exclude high grade squamous intraepithelial lesion on cytologic smear of cervix (ASC-H)     Most recent  Pap smear results of both endocervical canal and ectocervix reveal ASCUS.  History of AGUS  discussed.   Plan:            1.  I recommended a follow-up colposcopy in 6 months simply because of her confusion history of Herbert Pun and now ASCUS findings.  The fact that she continues to show negative HPV makes her low risk for cervical dysplasia and very very low risk for cervical cancer.  Orders No orders of the defined types were placed in this encounter.   No orders of the defined types were placed in this encounter.     F/U  Return in about 6 months (around 09/29/2019). I spent 26 minutes involved in the care of this patient of which greater than 50% was spent discussing HPV and its relationship to cervical cancer, dysplasia.  We have discussed Angus in detail and the fact that this is now negative.  We have discussed Gardasil vaccination and how this affects HPV.  (Patient thinks she has had this vaccine) future follow-ups and management of abnormal Pap smears discussed.  All questions answered.  Finis Bud, M.D. 03/31/2019 11:45 AM

## 2019-03-31 NOTE — Progress Notes (Signed)
Patient comes in today to discuss pap results.

## 2019-04-04 ENCOUNTER — Encounter: Payer: Self-pay | Admitting: Family Medicine

## 2019-04-04 ENCOUNTER — Ambulatory Visit (INDEPENDENT_AMBULATORY_CARE_PROVIDER_SITE_OTHER): Payer: Medicaid Other | Admitting: Family Medicine

## 2019-04-04 ENCOUNTER — Other Ambulatory Visit: Payer: Self-pay

## 2019-04-04 ENCOUNTER — Telehealth: Payer: Self-pay

## 2019-04-04 VITALS — BP 120/80 | HR 89 | Temp 98.0°F | Resp 16 | Ht 64.0 in | Wt 177.1 lb

## 2019-04-04 DIAGNOSIS — I10 Essential (primary) hypertension: Secondary | ICD-10-CM

## 2019-04-04 NOTE — Telephone Encounter (Signed)
Copied from [place] [id number]. Topic: Appointment Scheduling - Scheduling Inquiry for Clinic >> Apr 04, 2019  7:43 AM Lori Gross wrote: Reason for CRM: pt would like appt with Dr Ancil Boozer this month in the afternoon.  Pt has several issues. She states last time she gave plasma her bp was 148/95 and hr 49. Pt states she has decided to stop the hydrochlorothiazide 25 MG tablet.  Also pt states after her period, she has irritation and itching 2 wks.  This has started since she started about 3 months ago.   However, pt sees Dr Amalia Hailey at [facility].  Se did not ask him about this issue.   Pt has been scheduled 05/01/2019.  Would like sooner appt if possible.

## 2019-04-04 NOTE — Progress Notes (Signed)
Name: Lori Gross   MRN: [id number]    DOB: 12-02-1995   Date:04/04/2019       Progress Note  Subjective  Chief Complaint  Chief Complaint  Patient presents with  . Hypertension    Patient states that when she goes to donate plasma her BP is always elevated.    HPI  HTN: she has been donating plasma twice a week and bp has been in the 130's/90's , she has also noticed some headaches, occasionally feels nauseated. Her bp usually to be in the low 100's in our office. Today she did not take Norvasc but took HCTZ this am . She has been worried about pap smear results. Discussed results, gave her reassurance.    Patient Active Problem List   Diagnosis Date Noted  . Tachycardia 07/15/2018  . Dyslipidemia 07/15/2018  . Hypertension, benign 11/20/2015  . Dysmenorrhea 10/08/2014  . 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 Microalbuminuria 10/08/2014  . Obesity (BMI 30.0-34.9) 10/08/2014  . Migraine without aura and responsive to treatment 10/08/2014    Past Surgical History:  Procedure Laterality Date  . NO PAST SURGERIES      Family History  Problem Relation Age of Onset  . Hypertension Mother   . Cancer Mother 7       Common Bile Duct  . Hypertension Sister   . Hypertension Brother   . Diabetes Paternal Grandmother   . Cancer Paternal Grandmother 2       brain cancer  . Diabetes Paternal Grandfather   . Hypertension Father   . Cancer Father 3       kidney cancer    Social History   Socioeconomic History  . Marital status: Single    Spouse name: Not on file  . Number of children: 2  . Years of education: Not on file  . Highest education level: High school graduate  Occupational History  . Not on file  Tobacco Use  . Smoking status: Never Smoker  . Smokeless tobacco: Never Used  Substance and Sexual Activity  . Alcohol use: No    Alcohol/week: 0.0 standard drinks  . Drug use: No  . Sexual activity: Yes    Partners: Male    Birth control/protection:  Pill  Other Topics Concern  . Not on file  Social History Narrative   Dating for 5 years Rolan Bucco ) they have  a baby girl - Primitivo Gauze on July 21st, 2017 and the are  living together   Working full time at [employer] full time.    Social Determinants of Health   Financial Resource Strain: Low Risk   . Difficulty of Paying Living Expenses: Not hard at all  Food Insecurity: No Food Insecurity  . Worried About Charity fundraiser in the Last Year: Never true  . Ran Out of Food in the Last Year: Never true  Transportation Needs: No Transportation Needs  . Lack of Transportation (Medical): No  . Lack of Transportation (Non-Medical): No  Physical Activity: Sufficiently Active  . Days of Exercise per Week: 5 days  . Minutes of Exercise per Session: 30 min  Stress: No Stress Concern Present  . Feeling of Stress : Not at all  Social Connections: Moderately Isolated  . Frequency of Communication with Friends and Family: Once a week  . Frequency of Social Gatherings with Friends and Family: Once a week  . Attends Religious Services: Never  . Active Member of Clubs or Organizations: No  . Attends  Club or Organization Meetings: Never  . Marital Status: Living with partner  Intimate Partner Violence: Not At Risk  . Fear of Current or Ex-Partner: No  . Emotionally Abused: No  . Physically Abused: No  . Sexually Abused: No     Current Outpatient Medications:  .  amLODipine (NORVASC) 5 MG tablet, TAKE 1 TABLET BY MOUTH EVERY DAY, Disp: 90 tablet, Rfl: 1 .  desogestrel-ethinyl estradiol (KARIVA) 0.15-0.02/0.01 MG (21/5) tablet, Take 1 tablet by mouth daily., Disp: 3 Package, Rfl: 3 .  hydrochlorothiazide (HYDRODIURIL) 25 MG tablet, TAKE 1 TABLET BY MOUTH EVERY DAY, Disp: 90 tablet, Rfl: 1  No Known Allergies  I personally reviewed active problem list, medication list, allergies, family history, social history, health maintenance with the patient/caregiver  today.   ROS  Constitutional: Negative for fever or weight change.  Respiratory: Negative for cough and shortness of breath.   Cardiovascular: Negative for chest pain or palpitations.  Gastrointestinal: Negative for abdominal pain, no bowel changes.  Musculoskeletal: Negative for gait problem or joint swelling.  Skin: Negative for rash.  Neurological: Negative for dizziness , positive for intermittent  headache.  No other specific complaints in a complete review of systems (except as listed in HPI above).  Objective  Vitals:   04/04/19 1517  BP: 120/80  Pulse: 89  Resp: 16  Temp: 98 F (36.7 C)  TempSrc: Temporal  SpO2: 99%  Weight: 177 lb 1.6 oz (80.3 kg)  Height: 5\' 4"  (1.626 m)    Body mass index is 30.4 kg/m.  Physical Exam  Constitutional: Patient appears well-developed and well-nourished. Obese  No distress.  HEENT: head atraumatic, normocephalic, pupils equal and reactive to light Cardiovascular: Normal rate, regular rhythm and normal heart sounds.  No murmur heard. No BLE edema. Pulmonary/Chest: Effort normal and breath sounds normal. No respiratory distress. Abdominal: Soft.  There is no tenderness. Psychiatric: Patient has a normal mood and affect. behavior is normal. Judgment and thought content normal.  Recent Results (from the past 2160 hour(s))  Cytology - PAP     Status: Abnormal   Collection Time: 03/21/19 11:26 AM  Result Value Ref Range   High risk HPV Negative    Neisseria Gonorrhea Negative    Chlamydia Negative    Adequacy      Satisfactory for evaluation; transformation zone component PRESENT.   Diagnosis (A)     - Atypical squamous cells of undetermined significance (ASC-US)   Comment Dr. Charm BargesButler has reviewed the case.    Comment Normal Reference Ranger Chlamydia - Negative    Comment      Normal Reference Range Neisseria Gonorrhea - Negative   Comment Normal Reference Range HPV - Negative   Cytology - PAP     Status: Abnormal    Collection Time: 03/21/19 11:26 AM  Result Value Ref Range   High risk HPV Negative    Adequacy      Satisfactory for evaluation; transformation zone component PRESENT.   Diagnosis (A)     - Atypical squamous cells, cannot exclude high grade squamous   Diagnosis intraepithelial lesion (ASC-H) (A)    Comment Dr. Charm BargesButler has reviewed the case.    Comment Normal Reference Range HPV - Negative       PHQ2/9: Depression screen [facility] 2/9 04/04/2019 01/16/2019 11/15/2018 07/15/2018 10/04/2017  Decreased Interest 0 0 0 0 0  Down, Depressed, Hopeless 0 0 0 0 0  PHQ - 2 Score 0 0 0 0 0  Altered sleeping 0 0  1 0 -  Tired, decreased energy 0 0 0 1 -  Change in appetite 0 0 1 0 -  Feeling bad or failure about yourself  0 0 0 0 -  Trouble concentrating 0 0 0 0 -  Moving slowly or fidgety/restless 0 0 0 0 -  Suicidal thoughts 0 0 0 0 -  PHQ-9 Score 0 0 2 1 -  Difficult doing work/chores - Not difficult at all Not difficult at all Not difficult at all -    phq 9 is negative   Fall Risk: Fall Risk  04/04/2019 01/16/2019 11/15/2018 07/15/2018 10/04/2017  Falls in the past year? 0 0 0 0 No  Number falls in past yr: 0 0 0 0 -  Injury with Fall? 0 0 0 0 -     Functional Status Survey: Is the patient deaf or have difficulty hearing?: No Does the patient have difficulty seeing, even when wearing glasses/contacts?: No Does the patient have difficulty concentrating, remembering, or making decisions?: No Does the patient have difficulty walking or climbing stairs?: No Does the patient have difficulty dressing or bathing?: No Does the patient have difficulty doing errands alone such as visiting a doctor's office or shopping?: No    Assessment & Plan  1. Hypertension, benign  Change bp medication to HCTZ 25 in am and norvasc at night

## 2019-05-01 ENCOUNTER — Ambulatory Visit: Payer: Medicaid Other | Admitting: Family Medicine

## 2019-07-13 DIAGNOSIS — Z1152 Encounter for screening for COVID-19: Secondary | ICD-10-CM | POA: Diagnosis not present

## 2019-07-17 ENCOUNTER — Encounter: Payer: Self-pay | Admitting: Family Medicine

## 2019-07-17 ENCOUNTER — Ambulatory Visit: Payer: Medicaid Other | Admitting: Family Medicine

## 2019-07-17 ENCOUNTER — Other Ambulatory Visit: Payer: Self-pay

## 2019-07-17 VITALS — BP 122/82 | HR 70 | Temp 97.3°F | Resp 16 | Ht 64.0 in | Wt 167.4 lb

## 2019-07-17 DIAGNOSIS — R87619 Unspecified abnormal cytological findings in specimens from cervix uteri: Secondary | ICD-10-CM

## 2019-07-17 DIAGNOSIS — I10 Essential (primary) hypertension: Secondary | ICD-10-CM | POA: Diagnosis not present

## 2019-07-17 DIAGNOSIS — E785 Hyperlipidemia, unspecified: Secondary | ICD-10-CM | POA: Diagnosis not present

## 2019-07-17 DIAGNOSIS — Z862 Personal history of diseases of the blood and blood-forming organs and certain disorders involving the immune mechanism: Secondary | ICD-10-CM | POA: Diagnosis not present

## 2019-07-17 DIAGNOSIS — G43009 Migraine without aura, not intractable, without status migrainosus: Secondary | ICD-10-CM | POA: Diagnosis not present

## 2019-07-17 DIAGNOSIS — R634 Abnormal weight loss: Secondary | ICD-10-CM | POA: Diagnosis not present

## 2019-07-17 DIAGNOSIS — R700 Elevated erythrocyte sedimentation rate: Secondary | ICD-10-CM | POA: Diagnosis not present

## 2019-07-17 DIAGNOSIS — R718 Other abnormality of red blood cells: Secondary | ICD-10-CM | POA: Diagnosis not present

## 2019-07-17 MED ORDER — HYDROCHLOROTHIAZIDE 25 MG PO TABS: 25.0000 mg | ORAL_TABLET | Freq: Every day | ORAL | 1 refills | Status: DC

## 2019-07-17 NOTE — Progress Notes (Signed)
Name: Lori Gross   MRN: [id number]    DOB: 03/07/1996   Date:07/17/2019       Progress Note  Subjective  Chief Complaint  Chief Complaint  Patient presents with  . Medication Refill    3 month F/U  . Hypertension    Stopped taking the Amlodipine because she felt cold, and bad overall due to low BP readings. Since just taking the HCTZ feels better and BP has been running 119/70's    HPI  HTN: since December she has not been taking Norvasc because her bp was low and she was feeling drained, she is currently on hctz 25 mg daily, no side effects, bp at home has been in 120's range or lower. No chest pain or palpitation. She gets dizzy sometines  Anemia: she used to donate plasma, and was told she had low HCT, she states when pregnant with first child she had to take iron. She denies pica, no blood in stools. Bowel movements are unchanged about every third. She is on ocp and cycles not as heavy, lasts a few days.   Dyslipidemia: we will recheck labs , not on medications  Migraine : resolved with weight loss  Weight loss: since March last year she stopped drinking sodas, has been working two jobs at [employer] full time and [employer] part time, has to small children at home. She is always moving, however lost 60 pounds. Discussed importance of checking causes of weight loss. We will check labs today. She is also doing intermittent fasting, only eating one meal a day   Abnormal pap: seen by gyn, and has follow up in June. No post-coital bleeding or pain during sex.     Patient Active Problem List   Diagnosis Date Noted  . Tachycardia 07/15/2018  . Dyslipidemia 07/15/2018  . Hypertension, benign 11/20/2015  . Dysmenorrhea 10/08/2014  . 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 Microalbuminuria 10/08/2014  . Obesity (BMI 30.0-34.9) 10/08/2014  . Migraine without aura and responsive to treatment 10/08/2014    Past Surgical History:  Procedure Laterality Date  . NO PAST  SURGERIES      Family History  Problem Relation Age of Onset  . Hypertension Mother   . Cancer Mother 40       Common Bile Duct  . Hypertension Sister   . Hypertension Brother   . Diabetes Paternal Grandmother   . Cancer Paternal Grandmother 27       brain cancer  . Diabetes Paternal Grandfather   . Hypertension Father   . Cancer Father 8       kidney cancer    Social History   Tobacco Use  . Smoking status: Never Smoker  . Smokeless tobacco: Never Used  Substance Use Topics  . Alcohol use: No    Alcohol/week: 0.0 standard drinks     Current Outpatient Medications:  .  desogestrel-ethinyl estradiol (KARIVA) 0.15-0.02/0.01 MG (21/5) tablet, Take 1 tablet by mouth daily., Disp: 3 Package, Rfl: 3 .  hydrochlorothiazide (HYDRODIURIL) 25 MG tablet, TAKE 1 TABLET BY MOUTH EVERY DAY, Disp: 90 tablet, Rfl: 1 .  amLODipine (NORVASC) 5 MG tablet, TAKE 1 TABLET BY MOUTH EVERY DAY (Patient not taking: Reported on 07/17/2019), Disp: 90 tablet, Rfl: 1  No Known Allergies  I personally reviewed active problem list, medication list, allergies, family history, social history, health maintenance with the patient/caregiver today.   ROS  Constitutional: Negative for fever , positive for weight change.  Respiratory: Negative for cough and  shortness of breath.   Cardiovascular: Negative for chest pain or palpitations.  Gastrointestinal: Negative for abdominal pain, no bowel changes.  Musculoskeletal: Negative for gait problem or joint swelling.  Skin: Negative for rash.  Neurological: Negative for dizziness or headache.  No other specific complaints in a complete review of systems (except as listed in HPI above).  Objective  Vitals:   07/17/19 1515  BP: 122/82  Pulse: 70  Resp: 16  Temp: (!) 97.3 F (36.3 C)  TempSrc: Temporal  SpO2: 98%  Weight: 167 lb 6.4 oz (75.9 kg)  Height: 5\' 4"  (1.626 m)    Body mass index is 28.73 kg/m.  Physical Exam  Constitutional: Patient  appears well-developed and well-nourished. Overweight.  No distress.  HEENT: head atraumatic, normocephalic, pupils equal and reactive to light Cardiovascular: Normal rate, regular rhythm and normal heart sounds.  No murmur heard. No BLE edema. Pulmonary/Chest: Effort normal and breath sounds normal. No respiratory distress. Abdominal: Soft.  There is no tenderness. Psychiatric: Patient has a normal mood and affect. behavior is normal. Judgment and thought content normal.  PHQ2/9: Depression screen [facility] 2/9 07/17/2019 04/04/2019 01/16/2019 11/15/2018 07/15/2018  Decreased Interest 0 0 0 0 0  Down, Depressed, Hopeless 0 0 0 0 0  PHQ - 2 Score 0 0 0 0 0  Altered sleeping 0 0 0 1 0  Tired, decreased energy 0 0 0 0 1  Change in appetite 0 0 0 1 0  Feeling bad or failure about yourself  0 0 0 0 0  Trouble concentrating 0 0 0 0 0  Moving slowly or fidgety/restless 0 0 0 0 0  Suicidal thoughts 0 0 0 0 0  PHQ-9 Score 0 0 0 2 1  Difficult doing work/chores Not difficult at all - Not difficult at all Not difficult at all Not difficult at all    phq 9 is negative   Fall Risk: Fall Risk  07/17/2019 04/04/2019 01/16/2019 11/15/2018 07/15/2018  Falls in the past year? 0 0 0 0 0  Number falls in past yr: 0 0 0 0 0  Injury with Fall? 0 0 0 0 0     Functional Status Survey: Is the patient deaf or have difficulty hearing?: No Does the patient have difficulty seeing, even when wearing glasses/contacts?: No Does the patient have difficulty concentrating, remembering, or making decisions?: No Does the patient have difficulty walking or climbing stairs?: No Does the patient have difficulty dressing or bathing?: No Does the patient have difficulty doing errands alone such as visiting a doctor's office or shopping?: No    Assessment & Plan  1. Hypertension, benign  - hydrochlorothiazide (HYDRODIURIL) 25 MG tablet; Take 1 tablet (25 mg total) by mouth daily.  Dispense: 90 tablet; Refill: 1 - CBC with  Differential/Platelet - COMPLETE METABOLIC PANEL WITH GFR - TSH - Microalbumin / creatinine urine ratio  2. Dyslipidemia  - Lipid panel  3. History of iron deficiency anemia  - Iron, TIBC and Ferritin Panel  4. Weight loss  - HIV Antibody (routine testing w rflx) - Sedimentation rate  5. Migraine without aura and responsive to treatment  No recent problems.   6. Atypical glandular cells on cervical Pap smear  Seeing GYN and going back for repeat pap smear in June

## 2019-07-18 ENCOUNTER — Other Ambulatory Visit: Payer: Self-pay | Admitting: Family Medicine

## 2019-07-18 DIAGNOSIS — R718 Other abnormality of red blood cells: Secondary | ICD-10-CM

## 2019-07-18 DIAGNOSIS — R700 Elevated erythrocyte sedimentation rate: Secondary | ICD-10-CM

## 2019-07-18 DIAGNOSIS — E559 Vitamin D deficiency, unspecified: Secondary | ICD-10-CM

## 2019-07-18 MED ORDER — VITAMIN D (ERGOCALCIFEROL) 1.25 MG (50000 UNIT) PO CAPS: 50000.0000 [IU] | ORAL_CAPSULE | ORAL | 0 refills | Status: DC

## 2019-07-19 LAB — CBC WITH DIFFERENTIAL/PLATELET
Absolute Monocytes: 237 cells/uL (ref 200–950)
Basophils Absolute: 11 cells/uL (ref 0–200)
Basophils Relative: 0.2 %
Eosinophils Absolute: 110 cells/uL (ref 15–500)
Eosinophils Relative: 2 %
HCT: 35.3 % (ref 35.0–45.0)
Hemoglobin: 11.7 g/dL (ref 11.7–15.5)
Lymphs Abs: 2288 cells/uL (ref 850–3900)
MCH: 34.1 pg — ABNORMAL HIGH (ref 27.0–33.0)
MCHC: 33.1 g/dL (ref 32.0–36.0)
MCV: 102.9 fL — ABNORMAL HIGH (ref 80.0–100.0)
MPV: 11.2 fL (ref 7.5–12.5)
Monocytes Relative: 4.3 %
Neutro Abs: 2855 cells/uL (ref 1500–7800)
Neutrophils Relative %: 51.9 %
Platelets: 203 10*3/uL (ref 140–400)
RBC: 3.43 10*6/uL — ABNORMAL LOW (ref 3.80–5.10)
RDW: 12.9 % (ref 11.0–15.0)
Total Lymphocyte: 41.6 %
WBC: 5.5 10*3/uL (ref 3.8–10.8)

## 2019-07-19 LAB — COMPLETE METABOLIC PANEL WITH GFR
AG Ratio: 1.6 (calc) (ref 1.0–2.5)
ALT: 8 U/L (ref 6–29)
AST: 11 U/L (ref 10–30)
Albumin: 4.4 g/dL (ref 3.6–5.1)
Alkaline phosphatase (APISO): 49 U/L (ref 31–125)
BUN: 10 mg/dL (ref 7–25)
CO2: 28 mmol/L (ref 20–32)
Calcium: 9.8 mg/dL (ref 8.6–10.2)
Chloride: 103 mmol/L (ref 98–110)
Creat: 0.71 mg/dL (ref 0.50–1.10)
GFR, Est African American: 137 mL/min/{1.73_m2} (ref >=60)
GFR, Est Non African American: 118 mL/min/{1.73_m2} (ref >=60)
Globulin: 2.7 g/dL (calc) (ref 1.9–3.7)
Glucose, Bld: 89 mg/dL (ref 65–99)
Potassium: 4.4 mmol/L (ref 3.5–5.3)
Sodium: 139 mmol/L (ref 135–146)
Total Bilirubin: 0.5 mg/dL (ref 0.2–1.2)
Total Protein: 7.1 g/dL (ref 6.1–8.1)

## 2019-07-19 LAB — MICROALBUMIN / CREATININE URINE RATIO
Creatinine, Urine: 88 mg/dL (ref 20–275)
Microalb Creat Ratio: 7 mcg/mg creat (ref <30)
Microalb, Ur: 0.6 mg/dL

## 2019-07-19 LAB — LIPID PANEL
Cholesterol: 235 mg/dL — ABNORMAL HIGH (ref <200)
HDL: 74 mg/dL (ref >=50)
LDL Cholesterol (Calc): 137 mg/dL (calc) — ABNORMAL HIGH
Non-HDL Cholesterol (Calc): 161 mg/dL (calc) — ABNORMAL HIGH (ref <130)
Total CHOL/HDL Ratio: 3.2 (calc) (ref <5.0)
Triglycerides: 122 mg/dL (ref <150)

## 2019-07-19 LAB — IRON,TIBC AND FERRITIN PANEL
%SAT: 21 % (calc) (ref 16–45)
Ferritin: 18 ng/mL (ref 16–154)
Iron: 90 ug/dL (ref 40–190)
TIBC: 427 mcg/dL (calc) (ref 250–450)

## 2019-07-19 LAB — HIV ANTIBODY (ROUTINE TESTING W REFLEX): HIV 1&2 Ab, 4th Generation: NONREACTIVE

## 2019-07-19 LAB — TEST AUTHORIZATION

## 2019-07-19 LAB — VITAMIN D 25 HYDROXY (VIT D DEFICIENCY, FRACTURES): Vit D, 25-Hydroxy: 18 ng/mL — ABNORMAL LOW (ref 30–100)

## 2019-07-19 LAB — TSH: TSH: 1.29 mIU/L

## 2019-07-19 LAB — C-REACTIVE PROTEIN: CRP: 4.6 mg/L (ref <8.0)

## 2019-07-19 LAB — SEDIMENTATION RATE: Sed Rate: 41 mm/h — ABNORMAL HIGH (ref 0–20)

## 2019-07-19 LAB — VITAMIN B12: Vitamin B-12: 280 pg/mL (ref 200–1100)

## 2019-09-28 ENCOUNTER — Encounter: Payer: Medicaid Other | Admitting: Obstetrics and Gynecology

## 2019-10-04 ENCOUNTER — Ambulatory Visit: Payer: Medicaid Other | Admitting: Obstetrics and Gynecology

## 2019-10-04 ENCOUNTER — Encounter: Payer: Self-pay | Admitting: Obstetrics and Gynecology

## 2019-10-04 ENCOUNTER — Other Ambulatory Visit (HOSPITAL_COMMUNITY)
Admission: RE | Admit: 2019-10-04 | Discharge: 2019-10-04 | Disposition: A | Discharge status: Home / Self Care | Payer: Medicaid Other | Source: Ambulatory Visit | Attending: Obstetrics and Gynecology | Admitting: Obstetrics and Gynecology

## 2019-10-04 ENCOUNTER — Other Ambulatory Visit: Payer: Self-pay

## 2019-10-04 ENCOUNTER — Encounter: Payer: Medicaid Other | Admitting: Obstetrics and Gynecology

## 2019-10-04 VITALS — BP 119/81 | HR 101 | Ht 64.0 in | Wt 166.3 lb

## 2019-10-04 DIAGNOSIS — R87611 Atypical squamous cells cannot exclude high grade squamous intraepithelial lesion on cytologic smear of cervix (ASC-H): Secondary | ICD-10-CM | POA: Insufficient documentation

## 2019-10-04 DIAGNOSIS — R87610 Atypical squamous cells of undetermined significance on cytologic smear of cervix (ASC-US): Secondary | ICD-10-CM | POA: Diagnosis not present

## 2019-10-04 DIAGNOSIS — R87618 Other abnormal cytological findings on specimens from cervix uteri: Secondary | ICD-10-CM | POA: Diagnosis not present

## 2019-10-04 NOTE — Progress Notes (Signed)
HPI:      [name] is a 24 y.o. [id number] who LMP was Patient's last menstrual period was 09/12/2019.  Subjective:   She presents today for cervical screening follow-up.  Within the last year patient has had atypical glandular cells of undetermined significance as well as atypical squamous cells of undetermined significance-high.  During that time the patient has also tested negative for high risk HPV.  She presents today for follow-up 6 months from her previous sampling.    Hx: The following portions of the patient's history were reviewed and updated as appropriate:             She  has a past medical history of Benign headache, Dysmenorrhea, Grieving, Hypertension, Marijuana use (2016), Microalbuminuria, Migraine without aura and without status migrainosus, not intractable, Obesity, and Urticaria. She does not have any pertinent problems on file. She  has a past surgical history that includes No past surgeries. Her family history includes Cancer (age of onset: 49) in her mother; Cancer (age of onset: 89) in her father; Cancer (age of onset: 46) in her paternal grandmother; Diabetes in her paternal grandfather and paternal grandmother; Hypertension in her brother, father, mother, and sister. She  reports that she has never smoked. She has never used smokeless tobacco. She reports that she does not drink alcohol and does not use drugs. She has a current medication list which includes the following prescription(s): desogestrel-ethinyl estradiol, hydrochlorothiazide, and vitamin d (ergocalciferol). She has No Known Allergies.       Review of Systems:  Review of Systems  Constitutional: Denied constitutional symptoms, night sweats, recent illness, fatigue, fever, insomnia and weight loss.  Eyes: Denied eye symptoms, eye pain, photophobia, vision change and visual disturbance.  Ears/Nose/Throat/Neck: Denied ear, nose, throat or neck symptoms, hearing loss, nasal discharge, sinus  congestion and sore throat.  Cardiovascular: Denied cardiovascular symptoms, arrhythmia, chest pain/pressure, edema, exercise intolerance, orthopnea and palpitations.  Respiratory: Denied pulmonary symptoms, asthma, pleuritic pain, productive sputum, cough, dyspnea and wheezing.  Gastrointestinal: Denied, gastro-esophageal reflux, melena, nausea and vomiting.  Genitourinary: Denied genitourinary symptoms including symptomatic vaginal discharge, pelvic relaxation issues, and urinary complaints.  Musculoskeletal: Denied musculoskeletal symptoms, stiffness, swelling, muscle weakness and myalgia.  Dermatologic: Denied dermatology symptoms, rash and scar.  Neurologic: Denied neurology symptoms, dizziness, headache, neck pain and syncope.  Psychiatric: Denied psychiatric symptoms, anxiety and depression.  Endocrine: Denied endocrine symptoms including hot flashes and night sweats.   Meds:   Current Outpatient Medications on File Prior to Visit  Medication Sig Dispense Refill  . desogestrel-ethinyl estradiol (KARIVA) 0.15-0.02/0.01 MG (21/5) tablet Take 1 tablet by mouth daily. 3 Package 3  . hydrochlorothiazide (HYDRODIURIL) 25 MG tablet Take 1 tablet (25 mg total) by mouth daily. 90 tablet 1  . Vitamin D, Ergocalciferol, (DRISDOL) 1.25 MG (50000 UNIT) CAPS capsule Take 1 capsule (50,000 Units total) by mouth every 7 (seven) days. 12 capsule 0   No current facility-administered medications on file prior to visit.    Objective:     Vitals:   10/04/19 1109  BP: 119/81  Pulse: (!) 101              Physical examination   Pelvic:   Vulva: Normal appearance.  No lesions.  Vagina: No lesions or abnormalities noted.  Support: Normal pelvic support.  Urethra No masses tenderness or scarring.  Meatus Normal size without lesions or prolapse.  Cervix: Normal appearance.  No lesions.  Anus: Normal exam.  No lesions.  Perineum: Normal exam.  No lesions.        Bimanual   Uterus: Normal size.   Non-tender.  Mobile.  AV.  Adnexae: No masses.  Non-tender to palpation.  Cul-de-sac: Negative for abnormality.     Assessment:    [id number] Patient Active Problem List   Diagnosis Date Noted  . Tachycardia 07/15/2018  . Dyslipidemia 07/15/2018  . Hypertension, benign 11/20/2015  . Dysmenorrhea 10/08/2014  . 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 Microalbuminuria 10/08/2014  . Obesity (BMI 30.0-34.9) 10/08/2014  . Migraine without aura and responsive to treatment 10/08/2014     1. Atypical squamous cells cannot exclude high grade squamous intraepithelial lesion on cytologic smear of cervix (ASC-H)     -Of significant note her last Pap smear sampling showed negative HPV 6 months ago.   Plan:            1.  Endocervical Cytobrush specimen 1-submit for cytology and HPV  2.  Exocervical Cytobrush specimen 2-submit for cytology and HPV. Orders No orders of the defined types were placed in this encounter.   No orders of the defined types were placed in this encounter.     F/U  Return for We will contact her with any abnormal test results. I spent 23 minutes involved in the care of this patient preparing to see the patient by obtaining and reviewing her medical history (including labs, imaging tests and prior procedures), documenting clinical information in the electronic health record (EHR), counseling and coordinating care plans, writing and sending prescriptions, ordering tests or procedures and directly communicating with the patient by discussing pertinent items from her history and physical exam as well as detailing my assessment and plan as noted above so that she has an informed understanding.  All of her questions were answered.  Lori Gross, M.D. 10/04/2019 11:43 AM

## 2019-10-04 NOTE — Addendum Note (Signed)
Addended by: Silvano Bilis on: 10/04/2019 12:03 PM   Modules accepted: Orders

## 2019-10-04 NOTE — Addendum Note (Signed)
Addended by: Brooke Dare on: 10/04/2019 12:14 PM   Modules accepted: Orders

## 2019-10-04 NOTE — Progress Notes (Signed)
Pt present for colposcopy due to having abnormal pap smear.  Pt stated that she was doing well no problems.

## 2019-10-05 LAB — CYTOLOGY - PAP
Comment: NEGATIVE
Comment: NEGATIVE
Diagnosis: NEGATIVE
Diagnosis: REACTIVE
Diagnosis: UNDETERMINED — AB
High risk HPV: NEGATIVE
High risk HPV: NEGATIVE

## 2019-10-13 ENCOUNTER — Other Ambulatory Visit: Payer: Self-pay | Admitting: Family Medicine

## 2019-10-13 DIAGNOSIS — R000 Tachycardia, unspecified: Secondary | ICD-10-CM

## 2019-10-13 DIAGNOSIS — G43009 Migraine without aura, not intractable, without status migrainosus: Secondary | ICD-10-CM

## 2019-10-13 DIAGNOSIS — E559 Vitamin D deficiency, unspecified: Secondary | ICD-10-CM

## 2019-10-13 NOTE — Telephone Encounter (Signed)
Requested medication (s) are due for refill today: yes  Requested medication (s) are on the active medication list: yes  Last refill:  07/18/2019  Future visit scheduled: yes  Notes to clinic:  this refill cannot be delegated    Requested Prescriptions  Pending Prescriptions Disp Refills   Vitamin D, Ergocalciferol, (DRISDOL) 1.25 MG (50000 UNIT) CAPS capsule [Pharmacy Med Name: VITAMIN D2 1.25MG (50,000 UNIT)] 12 capsule 0    Sig: Take 1 capsule (50,000 Units total) by mouth every 7 (seven) days.      Endocrinology:  Vitamins - Vitamin D Supplementation Failed - 10/13/2019 10:29 AM      Failed - 50,000 IU strengths are not delegated      Failed - Phosphate in normal range and within 360 days    Phosphorus  Date Value Ref Range Status  09/01/2016 4.7 (H) 2.5 - 4.5 mg/dL Final          Failed - Vitamin D in normal range and within 360 days    Vit D, 25-Hydroxy  Date Value Ref Range Status  07/17/2019 18 (L) 30 - 100 ng/mL Final    Comment:    Vitamin D Status         25-OH Vitamin D: . Deficiency:                    <20 ng/mL Insufficiency:             20 - 29 ng/mL Optimal:                 > or = 30 ng/mL . For 25-OH Vitamin D testing on patients on  D2-supplementation and patients for whom quantitation  of D2 and D3 fractions is required, the [employer](TM) 25-OH VIT D, (D2,D3), LC/MS/MS is recommended: order  code 54627 (patients >37yrs). See Note 1 . Note 1 . For additional information, please refer to  http://education.[facility].com/faq/FAQ199  (This link is being provided for informational/ educational purposes only.)           Passed - Ca in normal range and within 360 days    Calcium  Date Value Ref Range Status  07/17/2019 9.8 8.6 - 10.2 mg/dL Final          Passed - Valid encounter within last 12 months    Recent Outpatient Visits           2 months ago Hypertension, benign   [facility] Alba Cory, MD   6  months ago Hypertension, benign   [facility] Alba Cory, MD   9 months ago Hypertension, benign   [facility] Alba Cory, MD   11 months ago Well woman exam   [facility] Alba Cory, MD   1 year ago Morbid obesity [facility]   [facility] Alba Cory, MD       Future Appointments             In 4 days Alba Cory, MD [facility]   In 5 months Logan Bores, Ellsworth Lennox, MD [facility]

## 2019-10-17 ENCOUNTER — Encounter: Payer: Self-pay | Admitting: Family Medicine

## 2019-10-17 ENCOUNTER — Ambulatory Visit (INDEPENDENT_AMBULATORY_CARE_PROVIDER_SITE_OTHER): Payer: Medicaid Other | Admitting: Family Medicine

## 2019-10-17 ENCOUNTER — Other Ambulatory Visit: Payer: Self-pay

## 2019-10-17 VITALS — BP 144/100 | HR 99 | Temp 98.1°F | Resp 14 | Ht 64.0 in | Wt 168.1 lb

## 2019-10-17 DIAGNOSIS — E785 Hyperlipidemia, unspecified: Secondary | ICD-10-CM

## 2019-10-17 DIAGNOSIS — E559 Vitamin D deficiency, unspecified: Secondary | ICD-10-CM | POA: Diagnosis not present

## 2019-10-17 DIAGNOSIS — G43009 Migraine without aura, not intractable, without status migrainosus: Secondary | ICD-10-CM | POA: Diagnosis not present

## 2019-10-17 DIAGNOSIS — Z862 Personal history of diseases of the blood and blood-forming organs and certain disorders involving the immune mechanism: Secondary | ICD-10-CM | POA: Diagnosis not present

## 2019-10-17 DIAGNOSIS — E538 Deficiency of other specified B group vitamins: Secondary | ICD-10-CM

## 2019-10-17 DIAGNOSIS — Z8742 Personal history of other diseases of the female genital tract: Secondary | ICD-10-CM | POA: Diagnosis not present

## 2019-10-17 DIAGNOSIS — I10 Essential (primary) hypertension: Secondary | ICD-10-CM | POA: Diagnosis not present

## 2019-10-17 MED ORDER — B-12 1000 MCG SL SUBL: 1.0000 | SUBLINGUAL_TABLET | Freq: Every day | SUBLINGUAL | 1 refills | Status: DC

## 2019-10-17 MED ORDER — VITAMIN D (ERGOCALCIFEROL) 1.25 MG (50000 UNIT) PO CAPS: 50000.0000 [IU] | ORAL_CAPSULE | ORAL | 0 refills | Status: DC

## 2019-10-17 MED ORDER — SPIRONOLACTONE-HCTZ 25-25 MG PO TABS: 1.0000 | ORAL_TABLET | Freq: Every day | ORAL | 1 refills | Status: DC

## 2019-10-17 MED ORDER — CYANOCOBALAMIN 1000 MCG/ML IJ SOLN
1000.0000 ug | Freq: Once | INTRAMUSCULAR | Status: AC
Start: 2019-10-17 — End: 2019-10-17
  Administered 2019-10-17: 1000 ug via INTRAMUSCULAR

## 2019-10-17 NOTE — Progress Notes (Signed)
Name: Lori Gross   MRN: [id number]    DOB: July 26, 1995   Date:10/17/2019       Progress Note  Subjective  Chief Complaint  Chief Complaint  Patient presents with  . Follow-up  . Hypertension    HPI  HTN: since December she has not been taking Norvasc because her bp was low and she was feeling drained, she is currently on hctz 25 mg daily, no side effects, bp is up again, we will try aldactizide, she did not like taking beta blocker made her feel sluggished   Anemia: resolved, still donating plasma but not as often.   Dyslipidemia: last LDL was 137 , discussed healthier diet, more fish, fruit , vegetables and avoid fried food  Migraine : resolved with weight loss, she states migraine was associated with photophobia and phonophobia, throbbing like and usually on right side of her head   Weight loss: since March last year she stopped drinking sodas, has been working two jobs at [employer] full time and also [employer] part time, has to small children at home. She is always moving,  Lost about 60 pounds, she gained 2 lbs since last visit .    Abnormal pap: seen by gyn, and has follow up in June. No post-coital bleeding or pain during sex. Last pap smear was normal, we will recheck it yearly now   B12 and Vitamin D; feeling tired, we will resume supplementation  Patient Active Problem List   Diagnosis Date Noted  . Tachycardia 07/15/2018  . Dyslipidemia 07/15/2018  . Hypertension, benign 11/20/2015  . Dysmenorrhea 10/08/2014  . 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 Microalbuminuria 10/08/2014  . Obesity (BMI 30.0-34.9) 10/08/2014  . Migraine without aura and responsive to treatment 10/08/2014    Past Surgical History:  Procedure Laterality Date  . NO PAST SURGERIES      Family History  Problem Relation Age of Onset  . Hypertension Mother   . Cancer Mother 36       Common Bile Duct  . Hypertension Sister   . Hypertension Brother   . Diabetes Paternal  Grandmother   . Cancer Paternal Grandmother 76       brain cancer  . Diabetes Paternal Grandfather   . Hypertension Father   . Cancer Father 29       kidney cancer    Social History   Tobacco Use  . Smoking status: Never Smoker  . Smokeless tobacco: Never Used  Substance Use Topics  . Alcohol use: No    Alcohol/week: 0.0 standard drinks     Current Outpatient Medications:  .  desogestrel-ethinyl estradiol (KARIVA) 0.15-0.02/0.01 MG (21/5) tablet, Take 1 tablet by mouth daily., Disp: 3 Package, Rfl: 3 .  Vitamin D, Ergocalciferol, (DRISDOL) 1.25 MG (50000 UNIT) CAPS capsule, Take 1 capsule (50,000 Units total) by mouth every 7 (seven) days., Disp: 12 capsule, Rfl: 0 .  Cyanocobalamin (B-12) 1000 MCG SUBL, Place 1 tablet under the tongue daily., Disp: 100 tablet, Rfl: 1 .  spironolactone-hydrochlorothiazide (ALDACTAZIDE) 25-25 MG tablet, Take 1 tablet by mouth daily., Disp: 90 tablet, Rfl: 1  No Known Allergies  I personally reviewed active problem list, medication list, allergies, family history, social history, health maintenance with the patient/caregiver today.   ROS  Constitutional: Negative for fever or weight change.  Respiratory: Negative for cough and shortness of breath.   Cardiovascular: Negative for chest pain or palpitations.  Gastrointestinal: Negative for abdominal pain, no bowel changes.  Musculoskeletal: Negative for gait problem  or joint swelling.  Skin: Negative for rash.  Neurological: Negative for dizziness or headache.  No other specific complaints in a complete review of systems (except as listed in HPI above).  Objective  Vitals:   10/17/19 1509 10/17/19 1605  BP: (!) 146/96 (!) 144/100  Pulse: 99   Resp: 14   Temp: 98.1 F (36.7 C)   TempSrc: Temporal   SpO2: 100%   Weight: 168 lb 1.6 oz (76.2 kg)   Height: 5\' 4"  (1.626 m)     Body mass index is 28.85 kg/m.  Physical Exam  Constitutional: Patient appears well-developed and  well-nourished. Overweight.  No distress.  HEENT: head atraumatic, normocephalic, pupils equal and reactive to light, neck supple Cardiovascular: Normal rate, regular rhythm and normal heart sounds.  No murmur heard. No BLE edema. Pulmonary/Chest: Effort normal and breath sounds normal. No respiratory distress. Abdominal: Soft.  There is no tenderness. Psychiatric: Patient has a normal mood and affect. behavior is normal. Judgment and thought content normal.  Recent Results (from the past 2160 hour(s))  Cytology - PAP     Status: Abnormal   Collection Time: 10/04/19 12:11 PM  Result Value Ref Range   High risk HPV Negative    Adequacy      Satisfactory for evaluation; transformation zone component PRESENT.   Diagnosis (A)     - Atypical squamous cells of undetermined significance (ASC-US)   Comment Normal Reference Range HPV - Negative   Cytology - PAP     Status: None   Collection Time: 10/04/19 12:11 PM  Result Value Ref Range   High risk HPV Negative    Adequacy      Satisfactory for evaluation; transformation zone component PRESENT.   Diagnosis      - Negative for Intraepithelial Lesions or Malignancy (NILM)   Diagnosis - Benign reactive/reparative changes    Comment Normal Reference Range HPV - Negative      PHQ2/9: Depression screen [facility] 2/9 10/17/2019 07/17/2019 04/04/2019 01/16/2019 11/15/2018  Decreased Interest 0 0 0 0 0  Down, Depressed, Hopeless 0 0 0 0 0  PHQ - 2 Score 0 0 0 0 0  Altered sleeping 0 0 0 0 1  Tired, decreased energy 0 0 0 0 0  Change in appetite 0 0 0 0 1  Feeling bad or failure about yourself  0 0 0 0 0  Trouble concentrating 0 0 0 0 0  Moving slowly or fidgety/restless 0 0 0 0 0  Suicidal thoughts 0 0 0 0 0  PHQ-9 Score 0 0 0 0 2  Difficult doing work/chores - Not difficult at all - Not difficult at all Not difficult at all    phq 9 is negative   Fall Risk: Fall Risk  10/17/2019 07/17/2019 04/04/2019 01/16/2019 11/15/2018  Falls in the past year?  0 0 0 0 0  Number falls in past yr: 0 0 0 0 0  Injury with Fall? 0 0 0 0 0     Functional Status Survey: Is the patient deaf or have difficulty hearing?: No Does the patient have difficulty seeing, even when wearing glasses/contacts?: No Does the patient have difficulty concentrating, remembering, or making decisions?: No Does the patient have difficulty walking or climbing stairs?: No Does the patient have difficulty dressing or bathing?: No Does the patient have difficulty doing errands alone such as visiting a doctor's office or shopping?: No    Assessment & Plan  1. Hypertension, benign  BP is high  again, we will add spironolactone, explained needs to stop if she gets pregnant  - spironolactone-hydrochlorothiazide (ALDACTAZIDE) 25-25 MG tablet; Take 1 tablet by mouth daily.  Dispense: 90 tablet; Refill: 1  2. Dyslipidemia  Discussed importance of healthier diet   3. Vitamin D deficiency  - Vitamin D, Ergocalciferol, (DRISDOL) 1.25 MG (50000 UNIT) CAPS capsule; Take 1 capsule (50,000 Units total) by mouth every 7 (seven) days.  Dispense: 12 capsule; Refill: 0  4. History of iron deficiency anemia   5. Migraine without aura and responsive to treatment  Doing well at this time   6. History of abnormal cervical Pap smear  Seen by gyn, last pap smear was normal   7. B12 deficiency  - cyanocobalamin ((VITAMIN B-12)) injection 1,000 mcg - Cyanocobalamin (B-12) 1000 MCG SUBL; Place 1 tablet under the tongue daily.  Dispense: 100 tablet; Refill: 1

## 2019-10-19 DIAGNOSIS — Z419 Encounter for procedure for purposes other than remedying health state, unspecified: Secondary | ICD-10-CM | POA: Diagnosis not present

## 2019-11-17 ENCOUNTER — Telehealth: Payer: Self-pay

## 2019-11-17 NOTE — Telephone Encounter (Signed)
Copied from CRM [id number]. Topic: General - Other >> Nov 17, 2019  3:07 PM Jaquita Rector A wrote: Reason for CRM: Patient called to inquire of her Dr about something that concerns her with having her menstrual cycle it been over for a week then she started spotting again.  Can be reached at Ph# [phone]

## 2019-11-18 ENCOUNTER — Emergency Department
Admission: EM | Admit: 2019-11-18 | Discharge: 2019-11-18 | Disposition: A | Discharge status: Home / Self Care | Payer: Medicaid Other | Attending: Emergency Medicine | Admitting: Emergency Medicine

## 2019-11-18 ENCOUNTER — Other Ambulatory Visit: Payer: Self-pay

## 2019-11-18 ENCOUNTER — Encounter: Payer: Self-pay | Admitting: Emergency Medicine

## 2019-11-18 DIAGNOSIS — I10 Essential (primary) hypertension: Secondary | ICD-10-CM | POA: Insufficient documentation

## 2019-11-18 DIAGNOSIS — R300 Dysuria: Secondary | ICD-10-CM | POA: Insufficient documentation

## 2019-11-18 DIAGNOSIS — Z79899 Other long term (current) drug therapy: Secondary | ICD-10-CM | POA: Insufficient documentation

## 2019-11-18 DIAGNOSIS — N915 Oligomenorrhea, unspecified: Secondary | ICD-10-CM | POA: Diagnosis present

## 2019-11-18 DIAGNOSIS — N3001 Acute cystitis with hematuria: Secondary | ICD-10-CM | POA: Insufficient documentation

## 2019-11-18 DIAGNOSIS — R109 Unspecified abdominal pain: Secondary | ICD-10-CM | POA: Diagnosis not present

## 2019-11-18 LAB — URINALYSIS, COMPLETE (UACMP) WITH MICROSCOPIC
Bacteria, UA: NONE SEEN
Bilirubin Urine: NEGATIVE
Glucose, UA: NEGATIVE mg/dL
Ketones, ur: NEGATIVE mg/dL
Leukocytes,Ua: NEGATIVE
Nitrite: NEGATIVE
Protein, ur: 30 mg/dL — AB
RBC / HPF: >50 RBC/hpf — ABNORMAL HIGH (ref 0–5)
Specific Gravity, Urine: 1.027 (ref 1.005–1.030)
WBC, UA: >50 WBC/hpf — ABNORMAL HIGH (ref 0–5)
pH: 5 (ref 5.0–8.0)

## 2019-11-18 LAB — POCT PREGNANCY, URINE: Preg Test, Ur: NEGATIVE

## 2019-11-18 MED ORDER — PHENAZOPYRIDINE HCL 200 MG PO TABS
200.0000 mg | ORAL_TABLET | Freq: Three times a day (TID) | ORAL | 0 refills | Status: DC | PRN
Start: 2019-11-18 — End: 2019-11-22

## 2019-11-18 MED ORDER — NITROFURANTOIN MONOHYD MACRO 100 MG PO CAPS
100.0000 mg | ORAL_CAPSULE | Freq: Two times a day (BID) | ORAL | 0 refills | Status: AC
Start: 2019-11-18 — End: 2019-11-25

## 2019-11-18 NOTE — ED Notes (Signed)
See triage note  Presents with some vaginal cramping and abnormal bleeding  States she started her period again this week   States bleeding started after having sex

## 2019-11-18 NOTE — ED Triage Notes (Signed)
Pt presents to ED via POV with c/o menstrual cycle. Pt states take PO birth control. Pt states started period 7/21, had sexual intercourse on 7/24, and pt states menstrual cycle started again on 7/30.  Pt c/o menstrual cramps at this time.

## 2019-11-18 NOTE — ED Provider Notes (Signed)
[facility] Emergency Department Provider Note   ____________________________________________   First MD Initiated Contact with Patient 11/18/19 [phone]     (approximate)  I have reviewed the triage vital signs and the nursing notes.   HISTORY  Chief Complaint Oligomenorrhea   HPI Lori Gross is a 24 y.o. female presents to the ED with complaint of abdominal cramping and dysuria.  Patient also states that she has had some abnormal bleeding.  She reports she began having her period again this week.  She states that her period started on 7/21 and again on 7/30.  She denies any abnormal vaginal discharge prior to this event.  She also has been urinating more often than usual.  She denies any fever, chills, nausea or vomiting.  She rates her pain as an 8 out of 10.     Past Medical History:  Diagnosis Date  . Benign headache   . Dysmenorrhea   . Grieving   . Hypertension   . Marijuana use 2016   positive UDS  . Microalbuminuria   . Migraine without aura and without status migrainosus, not intractable   . Obesity   . Urticaria     Patient Active Problem List   Diagnosis Date Noted  . Tachycardia 07/15/2018  . Dyslipidemia 07/15/2018  . Hypertension, benign 11/20/2015  . Dysmenorrhea 10/08/2014  . 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 Microalbuminuria 10/08/2014  . Obesity (BMI 30.0-34.9) 10/08/2014  . Migraine without aura and responsive to treatment 10/08/2014    Past Surgical History:  Procedure Laterality Date  . NO PAST SURGERIES      Prior to Admission medications   Medication Sig Start Date End Date Taking? Authorizing Provider  Cyanocobalamin (B-12) 1000 MCG SUBL Place 1 tablet under the tongue daily. 10/17/19   Alba Cory, MD  desogestrel-ethinyl estradiol (KARIVA) 0.15-0.02/0.01 MG (21/5) tablet Take 1 tablet by mouth daily. 03/21/19   Linzie Collin, MD  nitrofurantoin, macrocrystal-monohydrate, (MACROBID) 100 MG capsule  Take 1 capsule (100 mg total) by mouth 2 (two) times daily for 7 days. 11/18/19 11/25/19  Tommi Rumps, PA-C  phenazopyridine (PYRIDIUM) 200 MG tablet Take 1 tablet (200 mg total) by mouth 3 (three) times daily as needed for pain. 11/18/19 11/17/20  Tommi Rumps, PA-C  spironolactone-hydrochlorothiazide (ALDACTAZIDE) 25-25 MG tablet Take 1 tablet by mouth daily. 10/17/19   Alba Cory, MD  Vitamin D, Ergocalciferol, (DRISDOL) 1.25 MG (50000 UNIT) CAPS capsule Take 1 capsule (50,000 Units total) by mouth every 7 (seven) days. 10/17/19   Alba Cory, MD    Allergies Patient has no known allergies.  Family History  Problem Relation Age of Onset  . Hypertension Mother   . Cancer Mother 73       Common Bile Duct  . Hypertension Sister   . Hypertension Brother   . Diabetes Paternal Grandmother   . Cancer Paternal Grandmother 50       brain cancer  . Diabetes Paternal Grandfather   . Hypertension Father   . Cancer Father 13       kidney cancer    Social History Social History   Tobacco Use  . Smoking status: Never Smoker  . Smokeless tobacco: Never Used  Vaping Use  . Vaping Use: Never used  Substance Use Topics  . Alcohol use: No    Alcohol/week: 0.0 standard drinks  . Drug use: No    Review of Systems Constitutional: No fever/chills Eyes: No visual changes. Cardiovascular: Denies chest pain.  Respiratory: Denies shortness of breath. Gastrointestinal: No abdominal pain.  No nausea, no vomiting.  No diarrhea.   Genitourinary: Positive dysuria.  Positive menstrual cramps.  Negative for abnormal vaginal discharge. Musculoskeletal: Negative for muscle aches. Skin: Negative for rash. Neurological: Negative for headaches, focal weakness or numbness. ____________________________________________   PHYSICAL EXAM:  VITAL SIGNS: ED Triage Vitals  Enc Vitals Group     BP 11/18/19 0809 (!) 139/96     Pulse Rate 11/18/19 0809 98     Resp 11/18/19 0809 17     Temp  11/18/19 0809 99.1 F (37.3 C)     Temp Source 11/18/19 0809 Oral     SpO2 11/18/19 0809 100 %     Weight 11/18/19 0810 165 lb (74.8 kg)     Height 11/18/19 0810 5\' 4"  (1.626 m)     Head Circumference --      Peak Flow --      Pain Score 11/18/19 0819 8     Pain Loc --      Pain Edu? --      Excl. in GC? --     Constitutional: Alert and oriented. Well appearing and in no acute distress.  While waiting for urinalysis patient was noted to go to the restroom multiple times to urinate. Eyes: Conjunctivae are normal.  Head: Atraumatic. Neck: No stridor.   Cardiovascular: Normal rate, regular rhythm. Grossly normal heart sounds.  Good peripheral circulation. Respiratory: Normal respiratory effort.  No retractions. Lungs CTAB. Gastrointestinal: Soft and nontender. No distention.  No CVA tenderness to percussion. Musculoskeletal: Moves upper and lower extremities with any difficulty.  Normal gait was noted. Neurologic:  Normal speech and language. No gross focal neurologic deficits are appreciated.  Skin:  Skin is warm, dry and intact.  Psychiatric: Mood and affect are normal. Speech and behavior are normal.  ____________________________________________   LABS (all labs ordered are listed, but only abnormal results are displayed)  Labs Reviewed  URINALYSIS, COMPLETE (UACMP) WITH MICROSCOPIC - Abnormal; Notable for the following components:      Result Value   Color, Urine YELLOW (*)    APPearance HAZY (*)    Hgb urine dipstick LARGE (*)    Protein, ur 30 (*)    RBC / HPF >50 (*)    WBC, UA >50 (*)    All other components within normal limits  URINE CULTURE  POC URINE PREG, ED  POCT PREGNANCY, URINE    PROCEDURES  Procedure(s) performed (including Critical Care):  Procedures   ____________________________________________   INITIAL IMPRESSION / ASSESSMENT AND PLAN / ED COURSE  As part of my medical decision making, I reviewed the following data within the electronic  MEDICAL RECORD NUMBER Notes from prior ED visits and [place] Controlled Substance Database  24 year old female presents to the ED with concerns of irregular.  However while in the ED it was noted that she was making multiple trips to the restroom to urinate.  Patient urinalysis is consistent with a UTI.  Patient is negative for CVA tenderness, nausea, vomiting, fever or chills.  Patient was placed on Macrodantin twice daily for 7 days along with Pyridium 3 times daily to reduce symptoms of dysuria and also frequency.  Patient is to follow-up with her PCP or women's [facility] if any continued problems.  ____________________________________________   FINAL CLINICAL IMPRESSION(S) / ED DIAGNOSES  Final diagnoses:  Acute cystitis with hematuria     ED Discharge Orders  Ordered    nitrofurantoin, macrocrystal-monohydrate, (MACROBID) 100 MG capsule  2 times daily     Discontinue  Reprint     11/18/19 1102    phenazopyridine (PYRIDIUM) 200 MG tablet  3 times daily PRN     Discontinue  Reprint     11/18/19 1102           Note:  This document was prepared using Dragon voice recognition software and may include unintentional dictation errors.    Tommi Rumps, PA-C 11/18/19 1318    [place], MD 11/18/19 [phone]

## 2019-11-18 NOTE — Discharge Instructions (Addendum)
Follow-up with your primary care provider or [facility] if any continued problems.  A urine culture was ordered today and your provider can see the results of this.  Begin taking the Macrobid twice daily for 7 days.  The Pyridium 200 mg is 3 times a day if needed for urinary frequency or pain.  Increase fluids.  You may also take Tylenol with this medication if needed for pain.  If you continue to have problems you may also want to follow-up with Dr. Logan Bores.

## 2019-11-19 DIAGNOSIS — Z419 Encounter for procedure for purposes other than remedying health state, unspecified: Secondary | ICD-10-CM | POA: Diagnosis not present

## 2019-11-19 LAB — URINE CULTURE: Special Requests: NORMAL

## 2019-11-20 ENCOUNTER — Ambulatory Visit: Payer: Self-pay | Admitting: *Deleted

## 2019-11-20 NOTE — Telephone Encounter (Signed)
   Answer Assessment - Initial Assessment Questions 1. AMOUNT: "Describe the bleeding that you are having."    - SPOTTING: spotting, or pinkish / brownish mucous discharge; does not fill panti-liner or pad    - MILD:  less than 1 pad / hour; less than patient's usual menstrual bleeding   - MODERATE: 1-2 pads / hour; 1 menstrual cup every 6 hours; small-medium blood clots (e.g., pea, grape, small coin)   - SEVERE: soaking 2 or more pads/hour for 2 or more hours; 1 menstrual cup every 2 hours; bleeding not contained by pads or continuous red blood from vagina; large blood clots (e.g., golf ball, large coin)      mild 2. ONSET: "When did the bleeding begin?" "Is it continuing now?"     7/30 3. MENSTRUAL PERIOD: "When was the last normal menstrual period?" "How is this different than your period?"     7/21 4. REGULARITY: "How regular are your periods?"     Regular-- on OCP 5. ABDOMINAL PAIN: "Do you have any pain?" "How bad is the pain?"  (e.g., Scale 1-10; mild, moderate, or severe)   - MILD (1-3): doesn't interfere with normal activities, abdomen soft and not tender to touch    - MODERATE (4-7): interferes with normal activities or awakens from sleep, tender to touch    - SEVERE (8-10): excruciating pain, doubled over, unable to do any normal activities      Lower abdomin cramping 6. PREGNANCY: "Could you be pregnant?" "Are you sexually active?" "Did you recently give birth?"     No- uses OCP 7. BREASTFEEDING: "Are you breastfeeding?"     no 8. HORMONES: "Are you taking any hormone medications, prescription or OTC?" (e.g., birth control pills, estrogen)     OCP 9. BLOOD THINNERS: "Do you take any blood thinners?" (e.g., Coumadin/warfarin, Pradaxa/dabigatran, aspirin)     no 10. CAUSE: "What do you think is causing the bleeding?" (e.g., recent gyn surgery, recent gyn procedure; known bleeding disorder, cervical cancer, polycystic ovarian disease, fibroids)         no 11. HEMODYNAMIC STATUS:  "Are you weak or feeling lightheaded?" If Yes, ask: "Can you stand and walk normally?"        no 12. OTHER SYMPTOMS: "What other symptoms are you having with the bleeding?" (e.g., passed tissue, vaginal discharge, fever, menstrual-type cramps)       no  Protocols used: VAGINAL BLEEDING - ABNORMAL-A-AH

## 2019-11-20 NOTE — Telephone Encounter (Signed)
Patient reports she is having vaginal and urinary bleeding. Patient was seen at ED and was treated for UTI- she is presently using antibiotics. She reports it is really not helping and she is still seeing blood in urine. Patient also states she has been having abnormal vaginal bleeding. Patient reports she had her cycle and now in conjunction with the hematuria- she is bleeding lightly. Patient reports she may have taken OCP late-1-2 hours- but no missed pills. Patient states she did not have vaginal exam at ED - so infection was not ruled out. Appointment for follow up made with provider.  Reason for Disposition  Periods last > 7 days  Protocols used: VAGINAL BLEEDING - ABNORMAL-A-AH

## 2019-11-20 NOTE — Telephone Encounter (Signed)
She was evaluated at ED on Saturday after symptoms got worse. She was dx with a UTI. She continues to have hematuria.

## 2019-11-20 NOTE — Telephone Encounter (Signed)
Please call patient and have her schedule appointment to be seen as soon as possible.

## 2019-11-21 ENCOUNTER — Telehealth: Payer: Self-pay | Admitting: *Deleted

## 2019-11-21 NOTE — Telephone Encounter (Signed)
Contacted pt to complete transition of care assessment:  Transition Care Management Follow-up Telephone Call  . Medicaid Managed Care Transition Call Status:MM [facility] Call Made  . Date of discharge and from where: [facility], 11/18/19 . How have you been since you were released from the hospital? "Still bleeding"  . Any questions or concerns? No  Items Reviewed: Marland Kitchen Did the pt receive and understand the discharge instructions provided? Yes  . Medications obtained and verified? Yes  . Any new allergies since your discharge? No  . Dietary orders reviewed? No . Do you have support at home? yes  Functional Questionnaire: (I = Independent and D = Dependent)  ADLs: Independent Bathing/Dressing:Independent Meal Prep: Independent Eating: Independent Maintaining continence: Independent Transferring/Ambulation: Independent Managing Meds: Independent Follow up appointments reviewed:  PCP Hospital f/u appt confirmed? Yes  Scheduled to see  Dr Carlynn Purl on 11/22/19 @ 0920 [facility] Hospital f/u appt confirmed?  No; pt states she is going to see her PCP first Are transportation arrangements needed? No   If their condition worsens, is the pt aware to call PCP or go to the EmergencyDept.? yes  Was the patient provided with contact information for the PCP's office or ED? yes Was to pt encouraged to call back with questions or concerns? Yes  Burnard Bunting, RN, BSN, CCRN [facility] [phone]

## 2019-11-22 ENCOUNTER — Ambulatory Visit: Payer: Medicaid Other | Admitting: Family Medicine

## 2019-11-22 ENCOUNTER — Other Ambulatory Visit: Payer: Self-pay

## 2019-11-22 ENCOUNTER — Other Ambulatory Visit: Payer: Self-pay | Admitting: Family Medicine

## 2019-11-22 ENCOUNTER — Encounter: Payer: Self-pay | Admitting: Family Medicine

## 2019-11-22 VITALS — BP 132/78 | HR 99 | Temp 98.1°F | Resp 14 | Ht 64.0 in | Wt 171.1 lb

## 2019-11-22 DIAGNOSIS — R319 Hematuria, unspecified: Secondary | ICD-10-CM | POA: Diagnosis not present

## 2019-11-22 DIAGNOSIS — N390 Urinary tract infection, site not specified: Secondary | ICD-10-CM | POA: Diagnosis not present

## 2019-11-22 DIAGNOSIS — N939 Abnormal uterine and vaginal bleeding, unspecified: Secondary | ICD-10-CM

## 2019-11-22 DIAGNOSIS — Z131 Encounter for screening for diabetes mellitus: Secondary | ICD-10-CM | POA: Diagnosis not present

## 2019-11-22 DIAGNOSIS — E538 Deficiency of other specified B group vitamins: Secondary | ICD-10-CM | POA: Diagnosis not present

## 2019-11-22 DIAGNOSIS — R700 Elevated erythrocyte sedimentation rate: Secondary | ICD-10-CM | POA: Diagnosis not present

## 2019-11-22 DIAGNOSIS — E559 Vitamin D deficiency, unspecified: Secondary | ICD-10-CM

## 2019-11-22 DIAGNOSIS — R718 Other abnormality of red blood cells: Secondary | ICD-10-CM

## 2019-11-22 DIAGNOSIS — I10 Essential (primary) hypertension: Secondary | ICD-10-CM

## 2019-11-22 LAB — POCT URINALYSIS DIPSTICK
Glucose, UA: NEGATIVE
Ketones, UA: NEGATIVE
Nitrite, UA: NEGATIVE
Protein, UA: POSITIVE — AB
Spec Grav, UA: 1.015 (ref 1.010–1.025)
Urobilinogen, UA: 0.2 E.U./dL
pH, UA: 7 (ref 5.0–8.0)

## 2019-11-22 MED ORDER — CYANOCOBALAMIN 1000 MCG/ML IJ SOLN
1000.0000 ug | Freq: Once | INTRAMUSCULAR | Status: AC
  Administered 2019-11-22: 1000 ug via INTRAMUSCULAR

## 2019-11-22 MED ORDER — VITAMIN D (ERGOCALCIFEROL) 1.25 MG (50000 UNIT) PO CAPS: 50000.0000 [IU] | ORAL_CAPSULE | ORAL | 0 refills | Status: DC

## 2019-11-22 NOTE — Progress Notes (Signed)
Name: Lori Gross   MRN: [id number]    DOB: Mar 22, 1996   Date:11/22/2019       Progress Note  Subjective  Chief Complaint  Chief Complaint  Patient presents with  . Vaginal Bleeding    HPI  Vaginal bleeding/UTI: she takes ocp's, but had her normal cycle on July 21 st and on Friday July 30 th she started to bleed heavily again with cramping,  went to [facility] for evaluation , diagnosed with UTI and sent home on Macrobid, she states lower back pain and period cramps has resolved. She had a negative urine pregnancy test. Urine culture showed contamination. She states over the weekend she has urinary frequency and urgency that has resolved since. She states blood was not only when voiding but had to wear a pad and is now still spotting - wearing a panty liner. She never had intermenstrual bleeding. She states she took ocp later than usual two last week.   Patient Active Problem List   Diagnosis Date Noted  . Tachycardia 07/15/2018  . Dyslipidemia 07/15/2018  . Hypertension, benign 11/20/2015  . Dysmenorrhea 10/08/2014  . 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 Microalbuminuria 10/08/2014  . Obesity (BMI 30.0-34.9) 10/08/2014  . Migraine without aura and responsive to treatment 10/08/2014    Past Surgical History:  Procedure Laterality Date  . NO PAST SURGERIES      Family History  Problem Relation Age of Onset  . Hypertension Mother   . Cancer Mother 40       Common Bile Duct  . Hypertension Sister   . Hypertension Brother   . Diabetes Paternal Grandmother   . Cancer Paternal Grandmother 11       brain cancer  . Diabetes Paternal Grandfather   . Hypertension Father   . Cancer Father 25       kidney cancer    Social History   Tobacco Use  . Smoking status: Never Smoker  . Smokeless tobacco: Never Used  Substance Use Topics  . Alcohol use: No    Alcohol/week: 0.0 standard drinks     Current Outpatient Medications:  .  amLODipine (NORVASC) 5 MG tablet, Take 5 mg by  mouth daily., Disp: , Rfl:  .  Cyanocobalamin (B-12) 1000 MCG SUBL, Place 1 tablet under the tongue daily., Disp: 100 tablet, Rfl: 1 .  desogestrel-ethinyl estradiol (KARIVA) 0.15-0.02/0.01 MG (21/5) tablet, Take 1 tablet by mouth daily., Disp: 3 Package, Rfl: 3 .  nitrofurantoin, macrocrystal-monohydrate, (MACROBID) 100 MG capsule, Take 1 capsule (100 mg total) by mouth 2 (two) times daily for 7 days., Disp: 14 capsule, Rfl: 0 .  phenazopyridine (PYRIDIUM) 200 MG tablet, Take 1 tablet (200 mg total) by mouth 3 (three) times daily as needed for pain., Disp: 12 tablet, Rfl: 0 .  spironolactone-hydrochlorothiazide (ALDACTAZIDE) 25-25 MG tablet, Take 1 tablet by mouth daily., Disp: 90 tablet, Rfl: 1 .  Vitamin D, Ergocalciferol, (DRISDOL) 1.25 MG (50000 UNIT) CAPS capsule, Take 1 capsule (50,000 Units total) by mouth every 7 (seven) days., Disp: 12 capsule, Rfl: 0  No Known Allergies  I personally reviewed active problem list, medication list, allergies, family history, social history with the patient/caregiver today.   ROS  Ten systems reviewed and is negative except as mentioned in HPI   Objective  Vitals:   11/22/19 0917  BP: 132/78  Pulse: 99  Resp: 14  Temp: 98.1 F (36.7 C)  TempSrc: Temporal  SpO2: 100%  Weight: 171 lb 1.6 oz (77.6 kg)  Height:  5\' 4"  (1.626 m)    Body mass index is 29.37 kg/m.  Physical Exam  Constitutional: Patient appears well-developed and well-nourished. Overweight  No distress.  HEENT: head atraumatic, normocephalic, pupils equal and reactive to light,  neck supple Cardiovascular: Normal rate, regular rhythm and normal heart sounds.  No murmur heard. No BLE edema. Pulmonary/Chest: Effort normal and breath sounds normal. No respiratory distress. Abdominal: Soft.  There is no tenderness. Psychiatric: Patient has a normal mood and affect. behavior is normal. Judgment and thought content normal.   Recent Results (from the past 2160 hour(s))   Cytology - PAP     Status: Abnormal   Collection Time: 10/04/19 12:11 PM  Result Value Ref Range   High risk HPV Negative    Adequacy      Satisfactory for evaluation; transformation zone component PRESENT.   Diagnosis (A)     - Atypical squamous cells of undetermined significance (ASC-US)   Comment Normal Reference Range HPV - Negative   Cytology - PAP     Status: None   Collection Time: 10/04/19 12:11 PM  Result Value Ref Range   High risk HPV Negative    Adequacy      Satisfactory for evaluation; transformation zone component PRESENT.   Diagnosis      - Negative for Intraepithelial Lesions or Malignancy (NILM)   Diagnosis - Benign reactive/reparative changes    Comment Normal Reference Range HPV - Negative   Urinalysis, Complete w Microscopic     Status: Abnormal   Collection Time: 11/18/19  7:54 AM  Result Value Ref Range   Color, Urine YELLOW (A) YELLOW   APPearance HAZY (A) CLEAR   Specific Gravity, Urine 1.027 1.005 - 1.030   pH 5.0 5.0 - 8.0   Glucose, UA NEGATIVE NEGATIVE mg/dL   Hgb urine dipstick LARGE (A) NEGATIVE   Bilirubin Urine NEGATIVE NEGATIVE   Ketones, ur NEGATIVE NEGATIVE mg/dL   Protein, ur 30 (A) NEGATIVE mg/dL   Nitrite NEGATIVE NEGATIVE   Leukocytes,Ua NEGATIVE NEGATIVE   RBC / HPF >50 (H) 0 - 5 RBC/hpf   WBC, UA >50 (H) 0 - 5 WBC/hpf   Bacteria, UA NONE SEEN NONE SEEN   Squamous Epithelial / LPF 0-5 0 - 5   Mucus PRESENT     Comment: Performed at [facility], [address], [place], [place]  Urine culture     Status: Abnormal   Collection Time: 11/18/19  7:54 AM   Specimen: Urine, Clean Catch  Result Value Ref Range   Specimen Description      URINE, CLEAN CATCH Performed at [facility], [address] [place], [place]    Special Requests      Normal Performed at [facility], [address] [place], [place]    Culture MULTIPLE SPECIES PRESENT, SUGGEST RECOLLECTION (A)     Report Status 11/19/2019 FINAL   Pregnancy, urine POC     Status: None   Collection Time: 11/18/19  9:34 AM  Result Value Ref Range   Preg Test, Ur NEGATIVE NEGATIVE    Comment:        THE SENSITIVITY OF THIS METHODOLOGY IS >24 mIU/mL   POCT Urinalysis Dipstick     Status: Abnormal   Collection Time: 11/22/19  9:28 AM  Result Value Ref Range   Color, UA Amber    Clarity, UA Clear    Glucose, UA Negative Negative   Bilirubin, UA Small  Ketones, UA Neg    Spec Grav, UA 1.015 1.010 - 1.025   Blood, UA large    pH, UA 7.0 5.0 - 8.0   Protein, UA Positive (A) Negative   Urobilinogen, UA 0.2 0.2 or 1.0 E.U./dL   Nitrite, UA negative    Leukocytes, UA Trace (A) Negative   Appearance Bloody    Odor No      PHQ2/9: Depression screen [facility] 2/9 11/22/2019 10/17/2019 07/17/2019 04/04/2019 01/16/2019  Decreased Interest 0 0 0 0 0  Down, Depressed, Hopeless 0 0 0 0 0  PHQ - 2 Score 0 0 0 0 0  Altered sleeping 0 0 0 0 0  Tired, decreased energy 3 0 0 0 0  Change in appetite 0 0 0 0 0  Feeling bad or failure about yourself  0 0 0 0 0  Trouble concentrating 0 0 0 0 0  Moving slowly or fidgety/restless 0 0 0 0 0  Suicidal thoughts 0 0 0 0 0  PHQ-9 Score 3 0 0 0 0  Difficult doing work/chores - - Not difficult at all - Not difficult at all    phq 9 is negative   Fall Risk: Fall Risk  11/22/2019 10/17/2019 07/17/2019 04/04/2019 01/16/2019  Falls in the past year? 0 0 0 0 0  Number falls in past yr: 0 0 0 0 0  Injury with Fall? 0 0 0 0 0    Functional Status Survey: Is the patient deaf or have difficulty hearing?: No Does the patient have difficulty seeing, even when wearing glasses/contacts?: No Does the patient have difficulty concentrating, remembering, or making decisions?: No Does the patient have difficulty walking or climbing stairs?: No Does the patient have difficulty dressing or bathing?: No Does the patient have difficulty doing errands alone such as visiting a doctor's office  or shopping?: No    Assessment & Plan   1. Vaginal bleeding  - POCT Urinalysis Dipstick - Urine Culture - she wants to hold off on GC testing - discussed changing ocp or holding off until Dec when she sees Dr. Logan Bores, she chose the later, call him if still bleeding by Monday   2. Urinary tract infection with hematuria, site unspecified  - POCT Urinalysis Dipstick - Urine Culture  4. Vitamin D deficiency  - VITAMIN D 25 Hydroxy (Vit-D Deficiency, Fractures) - Vitamin D, Ergocalciferol, (DRISDOL) 1.25 MG (50000 UNIT) CAPS capsule; Take 1 capsule (50,000 Units total) by mouth every 7 (seven) days.  Dispense: 12 capsule; Refill: 0  5. Elevated sed rate  - Sedimentation rate  6. Abnormal red blood cells  Recheck CBC  7. Hypertension, benign  - COMPLETE METABOLIC PANEL WITH GFR  8. Diabetes mellitus screening  - Hemoglobin A1c

## 2019-11-22 NOTE — Addendum Note (Signed)
Addended by: Benay Pike on: 11/22/2019 10:17 AM   Modules accepted: Orders

## 2019-11-23 LAB — SEDIMENTATION RATE: Sed Rate: 29 mm/h — ABNORMAL HIGH (ref 0–20)

## 2019-11-23 LAB — COMPLETE METABOLIC PANEL WITH GFR
AG Ratio: 1.6 (calc) (ref 1.0–2.5)
ALT: 11 U/L (ref 6–29)
AST: 14 U/L (ref 10–30)
Albumin: 4.2 g/dL (ref 3.6–5.1)
Alkaline phosphatase (APISO): 49 U/L (ref 31–125)
BUN: 11 mg/dL (ref 7–25)
CO2: 28 mmol/L (ref 20–32)
Calcium: 9.8 mg/dL (ref 8.6–10.2)
Chloride: 101 mmol/L (ref 98–110)
Creat: 0.7 mg/dL (ref 0.50–1.10)
GFR, Est African American: 140 mL/min/{1.73_m2} (ref >=60)
GFR, Est Non African American: 120 mL/min/{1.73_m2} (ref >=60)
Globulin: 2.7 g/dL (calc) (ref 1.9–3.7)
Glucose, Bld: 83 mg/dL (ref 65–139)
Potassium: 4.3 mmol/L (ref 3.5–5.3)
Sodium: 137 mmol/L (ref 135–146)
Total Bilirubin: 0.4 mg/dL (ref 0.2–1.2)
Total Protein: 6.9 g/dL (ref 6.1–8.1)

## 2019-11-23 LAB — CBC WITH DIFFERENTIAL/PLATELET
Absolute Monocytes: 270 cells/uL (ref 200–950)
Basophils Absolute: 21 cells/uL (ref 0–200)
Basophils Relative: 0.4 %
Eosinophils Absolute: 68 cells/uL (ref 15–500)
Eosinophils Relative: 1.3 %
HCT: 35 % (ref 35.0–45.0)
Hemoglobin: 12.1 g/dL (ref 11.7–15.5)
Lymphs Abs: 1570 cells/uL (ref 850–3900)
MCH: 34.9 pg — ABNORMAL HIGH (ref 27.0–33.0)
MCHC: 34.6 g/dL (ref 32.0–36.0)
MCV: 100.9 fL — ABNORMAL HIGH (ref 80.0–100.0)
MPV: 10.7 fL (ref 7.5–12.5)
Monocytes Relative: 5.2 %
Neutro Abs: 3271 cells/uL (ref 1500–7800)
Neutrophils Relative %: 62.9 %
Platelets: 227 10*3/uL (ref 140–400)
RBC: 3.47 10*6/uL — ABNORMAL LOW (ref 3.80–5.10)
RDW: 12.6 % (ref 11.0–15.0)
Total Lymphocyte: 30.2 %
WBC: 5.2 10*3/uL (ref 3.8–10.8)

## 2019-11-23 LAB — HEMOGLOBIN A1C
Hgb A1c MFr Bld: 4.8 % of total Hgb (ref <5.7)
Mean Plasma Glucose: 91 (calc)
eAG (mmol/L): 5 (calc)

## 2019-11-23 LAB — B12 AND FOLATE PANEL
Folate: 12.9 ng/mL
Vitamin B-12: 268 pg/mL (ref 200–1100)

## 2019-11-23 LAB — VITAMIN D 25 HYDROXY (VIT D DEFICIENCY, FRACTURES): Vit D, 25-Hydroxy: 52 ng/mL (ref 30–100)

## 2019-11-24 LAB — URINE CULTURE
MICRO NUMBER:: 10787477
Result:: NO GROWTH
SPECIMEN QUALITY:: ADEQUATE

## 2019-12-06 ENCOUNTER — Encounter: Payer: Self-pay | Admitting: Obstetrics and Gynecology

## 2019-12-06 ENCOUNTER — Other Ambulatory Visit: Payer: Self-pay

## 2019-12-06 ENCOUNTER — Ambulatory Visit (INDEPENDENT_AMBULATORY_CARE_PROVIDER_SITE_OTHER): Payer: Medicaid Other | Admitting: Obstetrics and Gynecology

## 2019-12-06 VITALS — BP 125/85 | HR 103 | Ht 64.0 in | Wt 171.5 lb

## 2019-12-06 DIAGNOSIS — N921 Excessive and frequent menstruation with irregular cycle: Secondary | ICD-10-CM | POA: Diagnosis not present

## 2019-12-06 NOTE — Progress Notes (Signed)
HPI:      [name] is a 24 y.o. [id number] who LMP was Patient's last menstrual period was 11/08/2019.  Subjective:   She presents today stating that she began bleeding in the middle of her OCP pack last month.  She continues to spot up until now.  She has just gotten to her "blue pills" which is her menstrual week. She states she did not miss pills. Patient states that she has not had bleeding after intercourse. She says the only thing different has been a visit to the emergency department for a diagnosed "UTI".    Hx: The following portions of the patient's history were reviewed and updated as appropriate:             She  has a past medical history of Benign headache, Dysmenorrhea, Grieving, Hypertension, Marijuana use (2016), Microalbuminuria, Migraine without aura and without status migrainosus, not intractable, Obesity, and Urticaria. She does not have any pertinent problems on file. She  has a past surgical history that includes No past surgeries. Her family history includes Cancer (age of onset: 36) in her mother; Cancer (age of onset: 24) in her father; Cancer (age of onset: 66) in her paternal grandmother; Diabetes in her paternal grandfather and paternal grandmother; Hypertension in her brother, father, mother, and sister. She  reports that she has never smoked. She has never used smokeless tobacco. She reports that she does not drink alcohol and does not use drugs. She has a current medication list which includes the following prescription(s): amlodipine, b-12, desogestrel-ethinyl estradiol, spironolactone-hydrochlorothiazide, and vitamin d (ergocalciferol). She has No Known Allergies.       Review of Systems:  Review of Systems  Constitutional: Denied constitutional symptoms, night sweats, recent illness, fatigue, fever, insomnia and weight loss.  Eyes: Denied eye symptoms, eye pain, photophobia, vision change and visual disturbance.  Ears/Nose/Throat/Neck: Denied  ear, nose, throat or neck symptoms, hearing loss, nasal discharge, sinus congestion and sore throat.  Cardiovascular: Denied cardiovascular symptoms, arrhythmia, chest pain/pressure, edema, exercise intolerance, orthopnea and palpitations.  Respiratory: Denied pulmonary symptoms, asthma, pleuritic pain, productive sputum, cough, dyspnea and wheezing.  Gastrointestinal: Denied, gastro-esophageal reflux, melena, nausea and vomiting.  Genitourinary: See HPI for additional information.  Musculoskeletal: Denied musculoskeletal symptoms, stiffness, swelling, muscle weakness and myalgia.  Dermatologic: Denied dermatology symptoms, rash and scar.  Neurologic: Denied neurology symptoms, dizziness, headache, neck pain and syncope.  Psychiatric: Denied psychiatric symptoms, anxiety and depression.  Endocrine: Denied endocrine symptoms including hot flashes and night sweats.   Meds:   Current Outpatient Medications on File Prior to Visit  Medication Sig Dispense Refill  . amLODipine (NORVASC) 5 MG tablet Take 5 mg by mouth daily.    . Cyanocobalamin (B-12) 1000 MCG SUBL Place 1 tablet under the tongue daily. 100 tablet 1  . desogestrel-ethinyl estradiol (KARIVA) 0.15-0.02/0.01 MG (21/5) tablet Take 1 tablet by mouth daily. 3 Package 3  . spironolactone-hydrochlorothiazide (ALDACTAZIDE) 25-25 MG tablet Take 1 tablet by mouth daily. 90 tablet 1  . Vitamin D, Ergocalciferol, (DRISDOL) 1.25 MG (50000 UNIT) CAPS capsule Take 1 capsule (50,000 Units total) by mouth every 7 (seven) days. 12 capsule 0   No current facility-administered medications on file prior to visit.    Objective:     Vitals:   12/06/19 1410  BP: 125/85  Pulse: (!) 103                Assessment:    G2P2002 Patient Active Problem List   Diagnosis  Date Noted  . Tachycardia 07/15/2018  . Dyslipidemia 07/15/2018  . Hypertension, benign 11/20/2015  . Dysmenorrhea 10/08/2014  . 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  Microalbuminuria 10/08/2014  . Obesity (BMI 30.0-34.9) 10/08/2014  . Migraine without aura and responsive to treatment 10/08/2014     1. Breakthrough bleeding on birth control pills     Patient currently on her menstrual period week   Plan:            1.  Advised patient to finish out this pack and begin her next pack.  I expect that she will have no further breakthrough bleeding when she has a new start in her next pack.  If she continues to have breakthrough bleeding during the white pills she should contact us and we will consider further work-up as well as the option to change OCPs for better cycle control.   Orders No orders of the defined types were placed in this encounter.   No orders of the defined types were placed in this encounter.     F/U  Return for Pt to contact us if symptoms worsen. I spent 21 minutes involved in the care of this patient preparing to see the patient by obtaining and reviewing her medical history (including labs, imaging tests and prior procedures), documenting clinical information in the electronic health record (EHR), counseling and coordinating care plans, writing and sending prescriptions, ordering tests or procedures and directly communicating with the patient by discussing pertinent items from her history and physical exam as well as detailing my assessment and plan as noted above so that she has an informed understanding.  All of her questions were answered.  Lori Gross, M.D. 12/06/2019 2:29 PM

## 2019-12-20 DIAGNOSIS — Z419 Encounter for procedure for purposes other than remedying health state, unspecified: Secondary | ICD-10-CM | POA: Diagnosis not present

## 2020-01-19 DIAGNOSIS — Z419 Encounter for procedure for purposes other than remedying health state, unspecified: Secondary | ICD-10-CM | POA: Diagnosis not present

## 2020-02-19 DIAGNOSIS — Z419 Encounter for procedure for purposes other than remedying health state, unspecified: Secondary | ICD-10-CM | POA: Diagnosis not present

## 2020-03-20 DIAGNOSIS — Z419 Encounter for procedure for purposes other than remedying health state, unspecified: Secondary | ICD-10-CM | POA: Diagnosis not present

## 2020-03-21 ENCOUNTER — Encounter: Payer: Medicaid Other | Admitting: Obstetrics and Gynecology

## 2020-03-26 ENCOUNTER — Encounter: Payer: Self-pay | Admitting: Obstetrics and Gynecology

## 2020-04-04 ENCOUNTER — Emergency Department
Admission: EM | Admit: 2020-04-04 | Discharge: 2020-04-04 | Disposition: A | Discharge status: Home / Self Care | Payer: Medicaid Other | Attending: Emergency Medicine | Admitting: Emergency Medicine

## 2020-04-04 ENCOUNTER — Encounter: Payer: Self-pay | Admitting: Emergency Medicine

## 2020-04-04 ENCOUNTER — Other Ambulatory Visit: Payer: Self-pay

## 2020-04-04 DIAGNOSIS — Z79899 Other long term (current) drug therapy: Secondary | ICD-10-CM | POA: Diagnosis not present

## 2020-04-04 DIAGNOSIS — R000 Tachycardia, unspecified: Secondary | ICD-10-CM | POA: Diagnosis not present

## 2020-04-04 DIAGNOSIS — N390 Urinary tract infection, site not specified: Secondary | ICD-10-CM | POA: Diagnosis not present

## 2020-04-04 DIAGNOSIS — K529 Noninfective gastroenteritis and colitis, unspecified: Secondary | ICD-10-CM | POA: Diagnosis not present

## 2020-04-04 DIAGNOSIS — I10 Essential (primary) hypertension: Secondary | ICD-10-CM | POA: Diagnosis not present

## 2020-04-04 DIAGNOSIS — A09 Infectious gastroenteritis and colitis, unspecified: Secondary | ICD-10-CM | POA: Diagnosis not present

## 2020-04-04 DIAGNOSIS — R1110 Vomiting, unspecified: Secondary | ICD-10-CM | POA: Diagnosis present

## 2020-04-04 LAB — COMPREHENSIVE METABOLIC PANEL
ALT: 16 U/L (ref 0–44)
AST: 21 U/L (ref 15–41)
Albumin: 4.1 g/dL (ref 3.5–5.0)
Alkaline Phosphatase: 53 U/L (ref 38–126)
Anion gap: 14 (ref 5–15)
BUN: 15 mg/dL (ref 6–20)
CO2: 24 mmol/L (ref 22–32)
Calcium: 9 mg/dL (ref 8.9–10.3)
Chloride: 99 mmol/L (ref 98–111)
Creatinine, Ser: 0.91 mg/dL (ref 0.44–1.00)
GFR, Estimated: >60 mL/min (ref >60)
Glucose, Bld: 128 mg/dL — ABNORMAL HIGH (ref 70–99)
Potassium: 3.1 mmol/L — ABNORMAL LOW (ref 3.5–5.1)
Sodium: 137 mmol/L (ref 135–145)
Total Bilirubin: 0.7 mg/dL (ref 0.3–1.2)
Total Protein: 8.2 g/dL — ABNORMAL HIGH (ref 6.5–8.1)

## 2020-04-04 LAB — URINALYSIS, COMPLETE (UACMP) WITH MICROSCOPIC
Bilirubin Urine: NEGATIVE
Glucose, UA: NEGATIVE mg/dL
Hgb urine dipstick: NEGATIVE
Ketones, ur: NEGATIVE mg/dL
Nitrite: NEGATIVE
Protein, ur: 30 mg/dL — AB
Specific Gravity, Urine: 1.017 (ref 1.005–1.030)
Squamous Epithelial / HPF: >50 — ABNORMAL HIGH (ref 0–5)
WBC, UA: >50 WBC/hpf — ABNORMAL HIGH (ref 0–5)
pH: 5 (ref 5.0–8.0)

## 2020-04-04 LAB — CBC WITH DIFFERENTIAL/PLATELET
Abs Immature Granulocytes: 0.02 10*3/uL (ref 0.00–0.07)
Basophils Absolute: 0 10*3/uL (ref 0.0–0.1)
Basophils Relative: 0 %
Eosinophils Absolute: 0.1 10*3/uL (ref 0.0–0.5)
Eosinophils Relative: 2 %
HCT: 44.6 % (ref 36.0–46.0)
Hemoglobin: 15.7 g/dL — ABNORMAL HIGH (ref 12.0–15.0)
Immature Granulocytes: 0 %
Lymphocytes Relative: 21 %
Lymphs Abs: 1.4 10*3/uL (ref 0.7–4.0)
MCH: 34.4 pg — ABNORMAL HIGH (ref 26.0–34.0)
MCHC: 35.2 g/dL (ref 30.0–36.0)
MCV: 97.8 fL (ref 80.0–100.0)
Monocytes Absolute: 0.5 10*3/uL (ref 0.1–1.0)
Monocytes Relative: 7 %
Neutro Abs: 4.7 10*3/uL (ref 1.7–7.7)
Neutrophils Relative %: 70 %
Platelets: 298 10*3/uL (ref 150–400)
RBC: 4.56 MIL/uL (ref 3.87–5.11)
RDW: 12.1 % (ref 11.5–15.5)
WBC: 6.8 10*3/uL (ref 4.0–10.5)
nRBC: 0 % (ref 0.0–0.2)

## 2020-04-04 LAB — POC URINE PREG, ED: Preg Test, Ur: NEGATIVE

## 2020-04-04 LAB — LIPASE, BLOOD: Lipase: 29 U/L (ref 11–51)

## 2020-04-04 MED ORDER — CIPROFLOXACIN HCL 500 MG PO TABS: 500.0000 mg | ORAL_TABLET | Freq: Two times a day (BID) | ORAL | 0 refills | Status: AC

## 2020-04-04 MED ORDER — ONDANSETRON 4 MG PO TBDP: ORAL_TABLET | ORAL | 0 refills | Status: DC

## 2020-04-04 MED ORDER — ONDANSETRON HCL 4 MG/2ML IJ SOLN
4.0000 mg | INTRAMUSCULAR | Status: AC
  Administered 2020-04-04: 06:00:00 4 mg via INTRAVENOUS
  Filled 2020-04-04: qty 2

## 2020-04-04 MED ORDER — SODIUM CHLORIDE 0.9 % IV BOLUS
1000.0000 mL | Freq: Once | INTRAVENOUS | Status: AC
  Administered 2020-04-04: 06:00:00 1000 mL via INTRAVENOUS

## 2020-04-04 MED ORDER — POTASSIUM CHLORIDE CRYS ER 20 MEQ PO TBCR
40.0000 meq | EXTENDED_RELEASE_TABLET | Freq: Once | ORAL | Status: AC
  Administered 2020-04-04: 07:00:00 40 meq via ORAL
  Filled 2020-04-04: qty 2

## 2020-04-04 MED ORDER — CIPROFLOXACIN HCL 500 MG PO TABS
500.0000 mg | ORAL_TABLET | ORAL | Status: AC
  Administered 2020-04-04: 06:00:00 500 mg via ORAL
  Filled 2020-04-04: qty 1

## 2020-04-04 NOTE — Discharge Instructions (Signed)
As we discussed you have a generally reassuring work-up.  Most likely your vomiting and diarrhea are caused by a viral illness which will resolve soon.  However he also have a urinary tract infection.  We discussed various treatment options and decided on ciprofloxacin, which should treat the urinary tract infection and may also be good for the diarrheal illness.  Please take the full prescription as prescribed for the next 3 days.  I also prescribed a medication you can use for nausea.  Please follow-up with your regular doctor by early next week if you are still having symptoms.  Try to stick with a bland diet but stay hydrated with clear fluids such as water or Gatorade.  Return to the emergency department if you develop new or worsening symptoms that concern you.

## 2020-04-04 NOTE — ED Provider Notes (Addendum)
[facility] Emergency Department Provider Note  ____________________________________________   Event Date/Time   First MD Initiated Contact with Patient 04/04/20 [id number]     (approximate)  I have reviewed the triage vital signs and the nursing notes.   HISTORY  Chief Complaint Emesis    HPI Lori Gross is a 24 y.o. female with medical history as listed below who presents for evaluation of about 3 to 4 days of persistent vomiting and diarrhea.  She says she has had no abdominal pain, just the recurrent diarrhea and vomiting.  Nothing particular makes the symptoms better or worse.  She said that her young son had similar symptoms before the onset of her symptoms.  She feels like she has not been able to eat or drink very much and feels generally weak.  She denies fever/chills, sore throat, chest pain, shortness of breath, and dysuria.  She said her symptoms are severe although she has not had any vomiting or diarrhea for a few hours.         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 Authorizing Provider  amLODipine (NORVASC) 5 MG tablet Take 5 mg by mouth daily. 08/16/19   [provider]  ciprofloxacin (CIPRO) 500 MG  tablet Take 1 tablet (500 mg total) by mouth 2 (two) times daily for 3 days. 04/04/20 04/07/20  Loleta Rose, MD  Cyanocobalamin (B-12) 1000 MCG SUBL Place 1 tablet under the tongue daily. 10/17/19   Alba Maralyn Witherell, MD  desogestrel-ethinyl estradiol (KARIVA) 0.15-0.02/0.01 MG (21/5) tablet Take 1 tablet by mouth daily. 03/21/19   Linzie Collin, MD  ondansetron (ZOFRAN ODT) 4 MG disintegrating tablet Allow 1-2 tablets to dissolve in your mouth every 8 hours as needed for nausea/vomiting 04/04/20   Loleta Rose, MD  spironolactone-hydrochlorothiazide (ALDACTAZIDE) 25-25 MG tablet Take 1 tablet by mouth daily. 10/17/19   Alba Mirenda Baltazar, MD  Vitamin D, Ergocalciferol, (DRISDOL) 1.25 MG (50000 UNIT) CAPS capsule Take 1 capsule (50,000 Units total) by mouth every 7 (seven) days. 11/22/19   Alba Juanya Villavicencio, MD    Allergies Patient has no known allergies.  Family History  Problem Relation Age of Onset   Hypertension Mother    Cancer Mother 94       Common Bile Duct   Hypertension Sister    Hypertension Brother    Diabetes Paternal Grandmother    Cancer Paternal Grandmother 51       brain cancer   Diabetes Paternal Grandfather    Hypertension Father    Cancer Father 79       kidney cancer    Social History Social History   Tobacco Use   Smoking status: Never Smoker   Smokeless  tobacco: Never Used  Vaping Use   Vaping Use: Never used  Substance Use Topics   Alcohol use: No    Alcohol/week: 0.0 standard drinks   Drug use: No    Review of Systems Constitutional: No fever/chills Eyes: No visual changes. ENT: No sore throat. Cardiovascular: Denies chest pain. Respiratory: Denies shortness of breath. Gastrointestinal: 3 to 4 days of vomiting and diarrhea.  No abdominal pain. Genitourinary: Negative for dysuria. Musculoskeletal: Negative for neck pain.  Negative for back pain. Integumentary: Negative for rash. Neurological: Negative for headaches, focal weakness  or numbness.   ____________________________________________   PHYSICAL EXAM:  VITAL SIGNS: ED Triage Vitals  Enc Vitals Group     BP 04/04/20 0125 (!) 145/95     Pulse Rate 04/04/20 0125 (!) 142     Resp 04/04/20 0125 16     Temp 04/04/20 0125 97.8 F (36.6 C)     Temp Source 04/04/20 0125 Oral     SpO2 04/04/20 0125 100 %     Weight 04/04/20 0120 89.8 kg (198 lb)     Height 04/04/20 0120 1.626 m (5\' 4" )     Head Circumference --      Peak Flow --      Pain Score 04/04/20 0118 0     Pain Loc --      Pain Edu? --      Excl. in GC? --     Constitutional: Alert and oriented.  Eyes: Conjunctivae are normal.  Head: Atraumatic. Nose: No congestion/rhinnorhea. Mouth/Throat: Patient is wearing a mask. Neck: No stridor.  No meningeal signs.   Cardiovascular: Normal rate, regular rhythm. Good peripheral circulation. Respiratory: Normal respiratory effort.  No retractions. Gastrointestinal: Soft and nontender. No distention.  No rebound or guarding. Musculoskeletal: No lower extremity tenderness nor edema. No gross deformities of extremities. Neurologic:  Normal speech and language. No gross focal neurologic deficits are appreciated.  Skin:  Skin is warm, dry and intact. Psychiatric: Mood and affect are normal. Speech and behavior are normal.  ____________________________________________   LABS (all labs ordered are listed, but only abnormal results are displayed)  Labs Reviewed  CBC WITH DIFFERENTIAL/PLATELET - Abnormal; Notable for the following components:      Result Value   Hemoglobin 15.7 (*)    MCH 34.4 (*)    All other components within normal limits  COMPREHENSIVE METABOLIC PANEL - Abnormal; Notable for the following components:   Potassium 3.1 (*)    Glucose, Bld 128 (*)    Total Protein 8.2 (*)    All other components within normal limits  URINALYSIS, COMPLETE (UACMP) WITH MICROSCOPIC - Abnormal; Notable for the following components:   Color, Urine AMBER (*)     APPearance TURBID (*)    Protein, ur 30 (*)    Leukocytes,Ua LARGE (*)    WBC, UA >50 (*)    Bacteria, UA RARE (*)    Squamous Epithelial / LPF >50 (*)    All other components within normal limits  URINE CULTURE  LIPASE, BLOOD  POC URINE PREG, ED   ____________________________________________  EKG  ED ECG REPORT I, Loleta Roseory Korey Arroyo, the attending physician, personally viewed and interpreted this ECG.  Date: 04/04/2020 EKG Time: 1:22 AM Rate: 148 Rhythm: Sinus tachycardia QRS Axis: normal Intervals: normal ST/T Wave abnormalities: Non-specific ST segment / T-wave changes, but no clear evidence of acute ischemia. Narrative Interpretation: no definitive evidence of acute ischemia; does not meet STEMI criteria.   ____________________________________________  RADIOLOGY Karen KaysI, Kalum Minner  York Cerise, personally viewed and evaluated these images (plain radiographs) as part of my medical decision making, as well as reviewing the written report by the radiologist.  ED MD interpretation: No indication for emergent imaging  Official radiology report(s): No results found.  ____________________________________________   PROCEDURES   Procedure(s) performed (including Critical Care):  Procedures   ____________________________________________   INITIAL IMPRESSION / MDM / ASSESSMENT AND PLAN / ED COURSE  As part of my medical decision making, I reviewed the following data within the electronic MEDICAL RECORD NUMBER Nursing notes reviewed and incorporated, Labs reviewed , Old chart reviewed and Notes from prior ED visits   Differential diagnosis includes, but is not limited to, viral gastroenteritis, biliary colic, UTI/pyelonephritis, pancreatitis, less likely appendicitis or diverticulitis.  The patient has no abdominal pain or tenderness on exam.  Vital signs are stable, afebrile, and not tachycardic (she was tachycardic in triage but this completely resolved after she was in an exam room  without treatment and I suspect it was related to acute symptoms and possible anxiety at the time, in addition to her volume depletion which we have addressed with fluids).  Comprehensive metabolic panel is essentially normal other than mild hypokalemia which is to be expected given the GI losses.  CBC is essentially normal other than a slightly elevated hemoglobin which likely represents volume loss and hemoconcentration.  Urinalysis is grossly positive although it is nitrite negative.  Lipase is normal.  The patient was able to tolerate fluids after IV Zofran and she got a liter of fluids given her volume losses.  She was also able to tolerate the potassium supplement.  We talked about adequate and appropriate treatment and the risks and benefits of Cipro versus Keflex.  Given that she is having 4 days of GI symptoms I think it is reasonable to choose Cipro because of its efficacy for both urinary tract infections and diarrheal illness.  we talked about the possibilities of side effects such as tendon rupture but this is relatively low risk and the treatment is appropriate for her.  She will take the full course of treatment and have Zofran available, will work on rehydration, and I gave my usual and customary return precautions.  Clinical Course as of 04/04/20 0643  Thu Apr 04, 2020  0627 Of note, I am ordering a 3-day course of Cipro 500 mg twice daily because this should be adequate for uncomplicated UTI as well as nonspecific diarrheal illness.  [CF]    Clinical Course User Index [CF] Loleta Rose, MD     ____________________________________________  FINAL CLINICAL IMPRESSION(S) / ED DIAGNOSES  Final diagnoses:  Gastroenteritis  Urinary tract infection without hematuria, site unspecified     MEDICATIONS GIVEN DURING THIS VISIT:  Medications  potassium chloride SA (KLOR-CON) CR tablet 40 mEq (has no administration in time range)  ondansetron (ZOFRAN) injection 4 mg (4 mg Intravenous  Given 04/04/20 0530)  sodium chloride 0.9 % bolus 1,000 mL (1,000 mLs Intravenous New Bag/Given 04/04/20 0530)  ciprofloxacin (CIPRO) tablet 500 mg (500 mg Oral Given 04/04/20 0530)     ED Discharge Orders         Ordered    ciprofloxacin (CIPRO) 500 MG tablet  2 times daily        04/04/20 0627    ondansetron (ZOFRAN ODT) 4 MG disintegrating tablet        04/04/20 2376          *Please note:  Vashti Hey was evaluated in Emergency  Department on 04/04/2020 for the symptoms described in the history of present illness. She was evaluated in the context of the global COVID-19 pandemic, which necessitated consideration that the patient might be at risk for infection with the SARS-CoV-2 virus that causes COVID-19. Institutional protocols and algorithms that pertain to the evaluation of patients at risk for COVID-19 are in a state of rapid change based on information released by regulatory bodies including the CDC and federal and state organizations. These policies and algorithms were followed during the patient's care in the ED.  Some ED evaluations and interventions may be delayed as a result of limited staffing during and after the pandemic.*  Note:  This document was prepared using Dragon voice recognition software and may include unintentional dictation errors.   Loleta Rose, MD 04/04/20 0630    Loleta Rose, MD 04/04/20 6761    Loleta Rose, MD 04/04/20 [phone]

## 2020-04-04 NOTE — ED Triage Notes (Signed)
Patient ambulatory to triage with steady gait, without difficulty or distress noted; pt reports N/V/D since Tuesday; denies abd pain

## 2020-04-06 LAB — URINE CULTURE
Culture: >=100000 — AB
Special Requests: NORMAL

## 2020-04-15 NOTE — Progress Notes (Signed)
Name: Lori Gross   MRN: [id number]    DOB: Mar 11, 1996   Date:04/22/2020       Progress Note  Subjective  Chief Complaint  Follow Up  HPI  HTN: bp today is borderline, taking norvasc 5 mg, not on aldactizide since last visit, we will resume diuretic but just hctz today. She denies chest pain , palpitation or sob  Dyslipidemia: last LDL was 137 , she states diet is out of control now, she prefers waiting to recheck labs, she will resume a healthier diet   Migraine : she states migraine was associated with photophobia and phonophobia, throbbing like and usually on right side of her head. Unchanged    Weight loss: since March last year she stopped drinking sodas, has been working two jobs at [employer] full time and also [employer] part time, has to small children at home. She is always moving,  Lost about 60 pounds, weight back in August was down to 172 lbs but she states since Thanksgiving she has not been compliant with a healthy diet and has gained it again, up to 198.9 lbs.    Abnormal pap: seen by gyn, and pap smear was back to normal and negative HPV  B12 and Vitamin D; she is taking supplements again   Patient Active Problem List   Diagnosis Date Noted  . Tachycardia 07/15/2018  . Dyslipidemia 07/15/2018  . Hypertension, benign 11/20/2015  . Dysmenorrhea 10/08/2014  . 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 Microalbuminuria 10/08/2014  . Obesity (BMI 30.0-34.9) 10/08/2014  . Migraine without aura and responsive to treatment 10/08/2014    Past Surgical History:  Procedure Laterality Date  . NO PAST SURGERIES      Family History  Problem Relation Age of Onset  . Hypertension Mother   . Cancer Mother 48       Common Bile Duct  . Hypertension Sister   . Hypertension Brother   . Diabetes Paternal Grandmother   . Cancer Paternal Grandmother 15       brain cancer  . Diabetes Paternal Grandfather   . Hypertension Father   . Cancer Father 68       kidney cancer     Social History   Tobacco Use  . Smoking status: Never Smoker  . Smokeless tobacco: Never Used  Substance Use Topics  . Alcohol use: No    Alcohol/week: 0.0 standard drinks     Current Outpatient Medications:  .  amLODipine (NORVASC) 5 MG tablet, Take 5 mg by mouth daily., Disp: , Rfl:  .  Cyanocobalamin (B-12) 1000 MCG SUBL, Place 1 tablet under the tongue daily., Disp: 100 tablet, Rfl: 1 .  desogestrel-ethinyl estradiol (KARIVA) 0.15-0.02/0.01 MG (21/5) tablet, Take 1 tablet by mouth daily., Disp: 3 Package, Rfl: 3 .  ondansetron (ZOFRAN ODT) 4 MG disintegrating tablet, Allow 1-2 tablets to dissolve in your mouth every 8 hours as needed for nausea/vomiting, Disp: 30 tablet, Rfl: 0 .  Vitamin D, Ergocalciferol, (DRISDOL) 1.25 MG (50000 UNIT) CAPS capsule, Take 1 capsule (50,000 Units total) by mouth every 7 (seven) days., Disp: 12 capsule, Rfl: 0  No Known Allergies  I personally reviewed active problem list, medication list, allergies, family history, social history, health maintenance with the patient/caregiver today.   ROS  Constitutional: Negative for fever , positive for weight change.  Respiratory: Negative for cough and shortness of breath.   Cardiovascular: Negative for chest pain or palpitations.  Gastrointestinal: Negative for abdominal pain, no bowel changes.  Musculoskeletal:  Negative for gait problem or joint swelling.  Skin: Negative for rash.  Neurological: Negative for dizziness or headache.  No other specific complaints in a complete review of systems (except as listed in HPI above).  Objective  Vitals:   04/22/20 1241  BP: 138/86  Pulse: 95  Resp: 17  Temp: 98 F (36.7 C)  TempSrc: Oral  SpO2: 99%  Weight: 198 lb 14.4 oz (90.2 kg)  Height: 5\' 4"  (1.626 m)    Body mass index is 34.14 kg/m.  Physical Exam  Constitutional: Patient appears well-developed and well-nourished. Obese  No distress.  HEENT: head atraumatic, normocephalic, pupils  equal and reactive to light, neck supple Cardiovascular: Normal rate, regular rhythm and normal heart sounds.  No murmur heard. No BLE edema. Pulmonary/Chest: Effort normal and breath sounds normal. No respiratory distress. Abdominal: Soft.  There is no tenderness. Psychiatric: Patient has a normal mood and affect. behavior is normal. Judgment and thought content normal.  Recent Results (from the past 2160 hour(s))  CBC with Differential     Status: Abnormal   Collection Time: 04/04/20  1:28 AM  Result Value Ref Range   WBC 6.8 4.0 - 10.5 K/uL   RBC 4.56 3.87 - 5.11 MIL/uL   Hemoglobin 15.7 (H) 12.0 - 15.0 g/dL   HCT 04/06/20 42.8 - 76.8 %   MCV 97.8 80.0 - 100.0 fL   MCH 34.4 (H) 26.0 - 34.0 pg   MCHC 35.2 30.0 - 36.0 g/dL   RDW 11.5 72.6 - 20.3 %   Platelets 298 150 - 400 K/uL   nRBC 0.0 0.0 - 0.2 %   Neutrophils Relative % 70 %   Neutro Abs 4.7 1.7 - 7.7 K/uL   Lymphocytes Relative 21 %   Lymphs Abs 1.4 0.7 - 4.0 K/uL   Monocytes Relative 7 %   Monocytes Absolute 0.5 0.1 - 1.0 K/uL   Eosinophils Relative 2 %   Eosinophils Absolute 0.1 0.0 - 0.5 K/uL   Basophils Relative 0 %   Basophils Absolute 0.0 0.0 - 0.1 K/uL   Immature Granulocytes 0 %   Abs Immature Granulocytes 0.02 0.00 - 0.07 K/uL    Comment: Performed at [facility], [address] [place], [place]  Comprehensive metabolic panel     Status: Abnormal   Collection Time: 04/04/20  1:28 AM  Result Value Ref Range   Sodium 137 135 - 145 mmol/L   Potassium 3.1 (L) 3.5 - 5.1 mmol/L   Chloride 99 98 - 111 mmol/L   CO2 24 22 - 32 mmol/L   Glucose, Bld 128 (H) 70 - 99 mg/dL    Comment: Glucose reference range applies only to samples taken after fasting for at least 8 hours.   BUN 15 6 - 20 mg/dL   Creatinine, Ser 04/06/20 0.44 - 1.00 mg/dL   Calcium 9.0 8.9 - 8.45 mg/dL   Total Protein 8.2 (H) 6.5 - 8.1 g/dL   Albumin 4.1 3.5 - 5.0 g/dL   AST 21 15 - 41 U/L   ALT 16 0 - 44 U/L   Alkaline Phosphatase  53 38 - 126 U/L   Total Bilirubin 0.7 0.3 - 1.2 mg/dL   GFR, Estimated 36.4 >68 mL/min    Comment: (NOTE) Calculated using the CKD-EPI Creatinine Equation (2021)    Anion gap 14 5 - 15    Comment: Performed at [facility], [address] [place], [place]  Lipase, blood  Status: None   Collection Time: 04/04/20  1:28 AM  Result Value Ref Range   Lipase 29 11 - 51 U/L    Comment: Performed at [facility], [address] [place], [place] [address]  Urinalysis, Complete w Microscopic     Status: Abnormal   Collection Time: 04/04/20  1:28 AM  Result Value Ref Range   Color, Urine AMBER (A) YELLOW    Comment: BIOCHEMICALS MAY BE AFFECTED BY COLOR   APPearance TURBID (A) CLEAR   Specific Gravity, Urine 1.017 1.005 - 1.030   pH 5.0 5.0 - 8.0   Glucose, UA NEGATIVE NEGATIVE mg/dL   Hgb urine dipstick NEGATIVE NEGATIVE   Bilirubin Urine NEGATIVE NEGATIVE   Ketones, ur NEGATIVE NEGATIVE mg/dL   Protein, ur 30 (A) NEGATIVE mg/dL   Nitrite NEGATIVE NEGATIVE   Leukocytes,Ua LARGE (A) NEGATIVE   RBC / HPF 11-20 0 - 5 RBC/hpf   WBC, UA >50 (H) 0 - 5 WBC/hpf   Bacteria, UA RARE (A) NONE SEEN   Squamous Epithelial / LPF >50 (H) 0 - 5   Mucus PRESENT     Comment: Performed at [facility], [address], [place], [place] [address]  Urine Culture     Status: Abnormal   Collection Time: 04/04/20  1:28 AM   Specimen: Urine, Random  Result Value Ref Range   Specimen Description      URINE, RANDOM Performed at [facility], [address] [place], [place] [address]    Special Requests      Normal Performed at [facility], [address] [place], [place] [address]    Culture >=100,000 COLONIES/mL ESCHERICHIA COLI (A)    Report Status 04/06/2020 FINAL    Organism ID, Bacteria ESCHERICHIA COLI (A)       Susceptibility   Escherichia coli - MIC*    AMPICILLIN 4 SENSITIVE Sensitive     CEFAZOLIN <=4 SENSITIVE Sensitive      CEFEPIME <=0.12 SENSITIVE Sensitive     CEFTRIAXONE <=0.25 SENSITIVE Sensitive     CIPROFLOXACIN <=0.25 SENSITIVE Sensitive     GENTAMICIN <=1 SENSITIVE Sensitive     IMIPENEM <=0.25 SENSITIVE Sensitive     NITROFURANTOIN <=16 SENSITIVE Sensitive     TRIMETH/SULFA <=20 SENSITIVE Sensitive     AMPICILLIN/SULBACTAM <=2 SENSITIVE Sensitive     PIP/TAZO <=4 SENSITIVE Sensitive     * >=100,000 COLONIES/mL ESCHERICHIA COLI  POC Urine Pregnancy, ED     Status: None   Collection Time: 04/04/20  1:41 AM  Result Value Ref Range   Preg Test, Ur NEGATIVE NEGATIVE    Comment:        THE SENSITIVITY OF THIS METHODOLOGY IS >24 mIU/mL       PHQ2/9: Depression screen [facility] 2/9 04/22/2020 11/22/2019 10/17/2019 07/17/2019 04/04/2019  Decreased Interest 0 0 0 0 0  Down, Depressed, Hopeless 0 0 0 0 0  PHQ - 2 Score 0 0 0 0 0  Altered sleeping - 0 0 0 0  Tired, decreased energy - 3 0 0 0  Change in appetite - 0 0 0 0  Feeling bad or failure about yourself  - 0 0 0 0  Trouble concentrating - 0 0 0 0  Moving slowly or fidgety/restless - 0 0 0 0  Suicidal thoughts - 0 0 0 0  PHQ-9 Score - 3 0 0 0  Difficult doing work/chores - - - Not difficult at all -  Some recent data might be  hidden    phq 9 is negative  Fall Risk: Fall Risk  04/22/2020 11/22/2019 10/17/2019 07/17/2019 04/04/2019  Falls in the past year? 0 0 0 0 0  Number falls in past yr: 0 0 0 0 0  Injury with Fall? 0 0 0 0 0     Functional Status Survey: Is the patient deaf or have difficulty hearing?: No Does the patient have difficulty seeing, even when wearing glasses/contacts?: No Does the patient have difficulty concentrating, remembering, or making decisions?: No Does the patient have difficulty walking or climbing stairs?: No Does the patient have difficulty dressing or bathing?: No Does the patient have difficulty doing errands alone such as visiting a doctor's office or shopping?: No    Assessment & Plan  1. Need for immunization  against influenza  - Flu Vaccine QUAD 36+ mos IM  2. Hypertension, benign  - amLODipine (NORVASC) 5 MG tablet; Take 1 tablet (5 mg total) by mouth daily.  Dispense: 90 tablet; Refill: 0 - hydrochlorothiazide (HYDRODIURIL) 12.5 MG tablet; Take 1 tablet (12.5 mg total) by mouth daily.  Dispense: 90 tablet; Refill: 0  3. Vitamin D deficiency  - Vitamin D, Ergocalciferol, (DRISDOL) 1.25 MG (50000 UNIT) CAPS capsule; Take 1 capsule (50,000 Units total) by mouth every 7 (seven) days.  Dispense: 12 capsule; Refill: 0  4. B12 deficiency  Continue supplementation  5. Dyslipidemia  Recheck levels next visit   6. Migraine without aura and responsive to treatment

## 2020-04-20 DIAGNOSIS — Z419 Encounter for procedure for purposes other than remedying health state, unspecified: Secondary | ICD-10-CM | POA: Diagnosis not present

## 2020-04-22 ENCOUNTER — Encounter: Payer: Self-pay | Admitting: Family Medicine

## 2020-04-22 ENCOUNTER — Other Ambulatory Visit: Payer: Self-pay

## 2020-04-22 ENCOUNTER — Ambulatory Visit: Payer: Medicaid Other | Admitting: Family Medicine

## 2020-04-22 VITALS — BP 138/86 | HR 95 | Temp 98.0°F | Resp 17 | Ht 64.0 in | Wt 198.9 lb

## 2020-04-22 DIAGNOSIS — G43009 Migraine without aura, not intractable, without status migrainosus: Secondary | ICD-10-CM | POA: Diagnosis not present

## 2020-04-22 DIAGNOSIS — Z23 Encounter for immunization: Secondary | ICD-10-CM | POA: Diagnosis not present

## 2020-04-22 DIAGNOSIS — E538 Deficiency of other specified B group vitamins: Secondary | ICD-10-CM

## 2020-04-22 DIAGNOSIS — I10 Essential (primary) hypertension: Secondary | ICD-10-CM

## 2020-04-22 DIAGNOSIS — E559 Vitamin D deficiency, unspecified: Secondary | ICD-10-CM | POA: Diagnosis not present

## 2020-04-22 DIAGNOSIS — E785 Hyperlipidemia, unspecified: Secondary | ICD-10-CM | POA: Diagnosis not present

## 2020-04-22 MED ORDER — HYDROCHLOROTHIAZIDE 12.5 MG PO TABS: 12.5000 mg | ORAL_TABLET | Freq: Every day | ORAL | 0 refills | Status: DC

## 2020-04-22 MED ORDER — AMLODIPINE BESYLATE 5 MG PO TABS: 5.0000 mg | ORAL_TABLET | Freq: Every day | ORAL | 0 refills | Status: DC

## 2020-04-22 MED ORDER — VITAMIN D (ERGOCALCIFEROL) 1.25 MG (50000 UNIT) PO CAPS: 50000.0000 [IU] | ORAL_CAPSULE | ORAL | 0 refills | Status: DC

## 2020-04-24 ENCOUNTER — Other Ambulatory Visit: Payer: Self-pay | Admitting: Obstetrics and Gynecology

## 2020-04-24 ENCOUNTER — Other Ambulatory Visit: Payer: Self-pay | Admitting: Family Medicine

## 2020-04-24 DIAGNOSIS — E559 Vitamin D deficiency, unspecified: Secondary | ICD-10-CM

## 2020-04-24 DIAGNOSIS — Z3041 Encounter for surveillance of contraceptive pills: Secondary | ICD-10-CM

## 2020-04-24 DIAGNOSIS — I10 Essential (primary) hypertension: Secondary | ICD-10-CM

## 2020-05-09 ENCOUNTER — Telehealth: Payer: Self-pay

## 2020-05-09 ENCOUNTER — Other Ambulatory Visit: Payer: Self-pay | Admitting: Family Medicine

## 2020-05-09 DIAGNOSIS — I10 Essential (primary) hypertension: Secondary | ICD-10-CM

## 2020-05-09 NOTE — Telephone Encounter (Signed)
Copied from CRM [id number]. Topic: General - Other >> May 03, 2020  2:59 PM Lori Gross wrote: Reason for CRM: Patient would like the nurse or doctor to call her regarding her BP medication.  She stated that the doctor changed her meds. And her BP is still running high.  Please advise and call patient to discuss at [phone]

## 2020-05-09 NOTE — Telephone Encounter (Signed)
Left vm for call back

## 2020-05-10 ENCOUNTER — Telehealth: Payer: Self-pay

## 2020-05-10 NOTE — Telephone Encounter (Signed)
BP has been 147/95 with heart rate of 92. This started about 1 week ago. She wants you to increase the dose on the norvasc to see if that works. She will continue the HCTZ.

## 2020-05-10 NOTE — Telephone Encounter (Signed)
Copied from CRM [id number]. Topic: General - Other >> May 10, 2020  9:11 AM Jaquita Rector A wrote: Reason for CRM: Patient called in to inquire of Dr Carlynn Purl about her BP being elevated since the change in her medication. Asking for a call to discuss please call Ph# [phone]

## 2020-05-12 ENCOUNTER — Other Ambulatory Visit: Payer: Self-pay | Admitting: Family Medicine

## 2020-05-12 DIAGNOSIS — I10 Essential (primary) hypertension: Secondary | ICD-10-CM

## 2020-05-12 MED ORDER — AMLODIPINE BESYLATE 10 MG PO TABS: 10.0000 mg | ORAL_TABLET | Freq: Every day | ORAL | 0 refills | Status: DC

## 2020-05-13 NOTE — Telephone Encounter (Signed)
BP has been 147/95 with heart rate of 92. This started about 1 week ago. She wants you to increase the dose on the norvasc to see if that works. She will continue the HCTZ.

## 2020-05-21 DIAGNOSIS — Z419 Encounter for procedure for purposes other than remedying health state, unspecified: Secondary | ICD-10-CM | POA: Diagnosis not present

## 2020-05-30 ENCOUNTER — Encounter: Payer: Medicaid Other | Admitting: Obstetrics and Gynecology

## 2020-06-06 ENCOUNTER — Other Ambulatory Visit: Payer: Self-pay | Admitting: Family Medicine

## 2020-06-06 DIAGNOSIS — I10 Essential (primary) hypertension: Secondary | ICD-10-CM

## 2020-06-18 DIAGNOSIS — Z419 Encounter for procedure for purposes other than remedying health state, unspecified: Secondary | ICD-10-CM | POA: Diagnosis not present

## 2020-06-28 ENCOUNTER — Encounter: Payer: Self-pay | Admitting: Obstetrics and Gynecology

## 2020-06-28 ENCOUNTER — Ambulatory Visit (INDEPENDENT_AMBULATORY_CARE_PROVIDER_SITE_OTHER): Payer: Medicaid Other | Admitting: Obstetrics and Gynecology

## 2020-06-28 ENCOUNTER — Other Ambulatory Visit (HOSPITAL_COMMUNITY)
Admission: RE | Admit: 2020-06-28 | Discharge: 2020-06-28 | Disposition: A | Discharge status: Home / Self Care | Payer: Medicaid Other | Source: Ambulatory Visit | Attending: Obstetrics and Gynecology | Admitting: Obstetrics and Gynecology

## 2020-06-28 ENCOUNTER — Other Ambulatory Visit: Payer: Self-pay

## 2020-06-28 VITALS — BP 122/93 | HR 80 | Ht 65.0 in | Wt 211.4 lb

## 2020-06-28 DIAGNOSIS — Z01419 Encounter for gynecological examination (general) (routine) without abnormal findings: Secondary | ICD-10-CM

## 2020-06-28 DIAGNOSIS — Z8742 Personal history of other diseases of the female genital tract: Secondary | ICD-10-CM | POA: Insufficient documentation

## 2020-06-28 DIAGNOSIS — Z3041 Encounter for surveillance of contraceptive pills: Secondary | ICD-10-CM

## 2020-06-28 MED ORDER — DESOGESTREL-ETHINYL ESTRADIOL 0.15-0.02/0.01 MG (21/5) PO TABS: 1.0000 | ORAL_TABLET | Freq: Every day | ORAL | 3 refills | Status: DC

## 2020-06-28 NOTE — Progress Notes (Signed)
HPI:      [name] is a 25 y.o. [phone] who LMP was No LMP recorded. (Menstrual status: Oral contraceptives).  Subjective:   She presents today for her annual examination.  She has no complaints she reports she is doing well.  Taking OCPs as directed.  Having normal regular cycles.    Hx: The following portions of the patient's history were reviewed and updated as appropriate:             She  has a past medical history of Benign headache, Dysmenorrhea, Grieving, Hypertension, Marijuana use (2016), Microalbuminuria, Migraine without aura and without status migrainosus, not intractable, Obesity, and Urticaria. She does not have any pertinent problems on file. She  has a past surgical history that includes No past surgeries. Her family history includes Cancer (age of onset: 40) in her mother; Cancer (age of onset: 27) in her father; Cancer (age of onset: 80) in her paternal grandmother; Diabetes in her paternal grandfather and paternal grandmother; Hypertension in her brother, father, mother, and sister. She  reports that she has never smoked. She has never used smokeless tobacco. She reports that she does not drink alcohol and does not use drugs. She has a current medication list which includes the following prescription(s): amlodipine, b-12, hydrochlorothiazide, vitamin d (ergocalciferol), and desogestrel-ethinyl estradiol. She has No Known Allergies.       Review of Systems:  Review of Systems  Constitutional: Denied constitutional symptoms, night sweats, recent illness, fatigue, fever, insomnia and weight loss.  Eyes: Denied eye symptoms, eye pain, photophobia, vision change and visual disturbance.  Ears/Nose/Throat/Neck: Denied ear, nose, throat or neck symptoms, hearing loss, nasal discharge, sinus congestion and sore throat.  Cardiovascular: Denied cardiovascular symptoms, arrhythmia, chest pain/pressure, edema, exercise intolerance, orthopnea and palpitations.   Respiratory: Denied pulmonary symptoms, asthma, pleuritic pain, productive sputum, cough, dyspnea and wheezing.  Gastrointestinal: Denied, gastro-esophageal reflux, melena, nausea and vomiting.  Genitourinary: Denied genitourinary symptoms including symptomatic vaginal discharge, pelvic relaxation issues, and urinary complaints.  Musculoskeletal: Denied musculoskeletal symptoms, stiffness, swelling, muscle weakness and myalgia.  Dermatologic: Denied dermatology symptoms, rash and scar.  Neurologic: Denied neurology symptoms, dizziness, headache, neck pain and syncope.  Psychiatric: Denied psychiatric symptoms, anxiety and depression.  Endocrine: Denied endocrine symptoms including hot flashes and night sweats.   Meds:   Current Outpatient Medications on File Prior to Visit  Medication Sig Dispense Refill  . amLODipine (NORVASC) 10 MG tablet TAKE 1 TABLET BY MOUTH EVERY DAY 30 tablet 1  . Cyanocobalamin (B-12) 1000 MCG SUBL Place 1 tablet under the tongue daily. 100 tablet 1  . hydrochlorothiazide (HYDRODIURIL) 12.5 MG tablet Take 1 tablet (12.5 mg total) by mouth daily. 90 tablet 0  . Vitamin D, Ergocalciferol, (DRISDOL) 1.25 MG (50000 UNIT) CAPS capsule TAKE 1 CAPSULE (50,000 UNITS TOTAL) BY MOUTH EVERY 7 (SEVEN) DAYS. 12 capsule 0   No current facility-administered medications on file prior to visit.       Upstream - 06/28/20 1111      Pregnancy Intention Screening   Does the patient want to become pregnant in the next year? No    Does the patient's partner want to become pregnant in the next year? No    Would the patient like to discuss contraceptive options today? No      Contraception Wrap Up   Current Method Oral Contraceptive    End Method Oral Contraceptive    Contraception Counseling Provided No  The pregnancy intention screening data noted above was reviewed. Potential methods of contraception were discussed. The patient elected to proceed with Oral  Contraceptive.     Objective:     Vitals:   06/28/20 1105  BP: (!) 122/93  Pulse: 80    Filed Weights   06/28/20 1105  Weight: 211 lb 6.4 oz (95.9 kg)              Physical examination General NAD, Conversant  HEENT Atraumatic; Op clear with mmm.  Normo-cephalic. Pupils reactive. Anicteric sclerae  Thyroid/Neck Smooth without nodularity or enlargement. Normal ROM.  Neck Supple.  Skin No rashes, lesions or ulceration. Normal palpated skin turgor. No nodularity.  Breasts: No masses or discharge.  Symmetric.  No axillary adenopathy.  Lungs: Clear to auscultation.No rales or wheezes. Normal Respiratory effort, no retractions.  Heart: NSR.  No murmurs or rubs appreciated. No periferal edema  Abdomen: Soft.  Non-tender.  No masses.  No HSM. No hernia  Extremities: Moves all appropriately.  Normal ROM for age. No lymphadenopathy.  Neuro: Oriented to PPT.  Normal mood. Normal affect.     Pelvic:   Vulva: Normal appearance.  No lesions.  Vagina: No lesions or abnormalities noted.  Support: Normal pelvic support.  Urethra No masses tenderness or scarring.  Meatus Normal size without lesions or prolapse.  Cervix: Normal appearance.  No lesions.  Anus: Normal exam.  No lesions.  Perineum: Normal exam.  No lesions.        Bimanual   Uterus: Normal size.  Non-tender.  Mobile.  AV.  Adnexae: No masses.  Non-tender to palpation.  Cul-de-sac: Negative for abnormality.      Assessment:    [id number] Patient Active Problem List   Diagnosis Date Noted  . Tachycardia 07/15/2018  . Dyslipidemia 07/15/2018  . Hypertension, benign 11/20/2015  . Dysmenorrhea 10/08/2014  . 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 Microalbuminuria 10/08/2014  . Obesity (BMI 30.0-34.9) 10/08/2014  . Migraine without aura and responsive to treatment 10/08/2014     1. Well woman exam with routine gynecological exam   2. History of abnormal cervical Pap smear   3. Encounter for surveillance of contraceptive  pills     Patient with a history of abnormal glandular cells followed by normal cytology and negative HPV Pap smears.  Doing well on OCPs.   Plan:            1.  Basic Screening Recommendations The basic screening recommendations for asymptomatic women were discussed with the patient during her visit.  The age-appropriate recommendations were discussed with her and the rational for the tests reviewed.  When I am informed by the patient that another primary care physician has previously obtained the age-appropriate tests and they are up-to-date, only outstanding tests are ordered and referrals given as necessary.  Abnormal results of tests will be discussed with her when all of her results are completed.  Routine preventative health maintenance measures emphasized: Exercise/Diet/Weight control, Tobacco Warnings, Alcohol/Substance use risks and Stress Management Pap performed because of her previous abnormal history. 2.  She would like to continue OCPs.   Orders No orders of the defined types were placed in this encounter.    Meds ordered this encounter  Medications  . desogestrel-ethinyl estradiol (KARIVA) 0.15-0.02/0.01 MG (21/5) tablet    Sig: Take 1 tablet by mouth daily.    Dispense:  84 tablet    Refill:  3    Patient needs appointment for further refills  F/U  No follow-ups on file.  [name], M.D. 06/28/2020 11:41 AM

## 2020-06-28 NOTE — Addendum Note (Signed)
Addended by: Dorian Pod on: 06/28/2020 12:08 PM   Modules accepted: Orders

## 2020-07-02 LAB — CYTOLOGY - PAP
Diagnosis: NEGATIVE
Diagnosis: REACTIVE

## 2020-07-03 ENCOUNTER — Telehealth: Payer: Self-pay | Admitting: Obstetrics and Gynecology

## 2020-07-03 NOTE — Telephone Encounter (Signed)
Notified patient of PAP results. Scheduled her for a one year physical.

## 2020-07-03 NOTE — Telephone Encounter (Signed)
Dr. Logan Bores pt. Please advise. Lori Gross

## 2020-07-03 NOTE — Telephone Encounter (Signed)
Pt called inquiring about test results. I updated patients demographics and assisted her to try to get back into mychart. Please Advise.

## 2020-07-08 ENCOUNTER — Other Ambulatory Visit: Payer: Self-pay | Admitting: Family Medicine

## 2020-07-08 DIAGNOSIS — I10 Essential (primary) hypertension: Secondary | ICD-10-CM

## 2020-07-19 DIAGNOSIS — Z419 Encounter for procedure for purposes other than remedying health state, unspecified: Secondary | ICD-10-CM | POA: Diagnosis not present

## 2020-07-19 NOTE — Progress Notes (Deleted)
Name: Lori Gross   MRN: [id number]    DOB: 1995/07/25   Date:07/19/2020       Progress Note  Subjective  Chief Complaint  Follow up   HPI HTN: bp today is borderline, taking norvasc 5 mg, not on aldactizide since last visit, we will resume diuretic but just hctz today. She denies chest pain , palpitation or sob  Dyslipidemia: last LDL was 137 , she states diet is out of control now, she prefers waiting to recheck labs, she will resume a healthier diet   Migraine : she states migraine was associated with photophobia and phonophobia, throbbing like and usually on right side of her head. Unchanged    Weight loss: since March last year she stopped drinking sodas, has been working two jobs at [employer] full time and also [employer] part time, has to small children at home. She is always moving,  Lost about 60 pounds, weight back in August was down to 172 lbs but she states since Thanksgiving she has not been compliant with a healthy diet and has gained it again, up to 198.9 lbs.    Abnormal pap: seen by gyn, and pap smear was back to normal and negative HPV  B12 and Vitamin D; she is taking supplements again   *** Patient Active Problem List   Diagnosis Date Noted  . Tachycardia 07/15/2018  . Dyslipidemia 07/15/2018  . Hypertension, benign 11/20/2015  . Dysmenorrhea 10/08/2014  . 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 Microalbuminuria 10/08/2014  . Obesity (BMI 30.0-34.9) 10/08/2014  . Migraine without aura and responsive to treatment 10/08/2014    Past Surgical History:  Procedure Laterality Date  . NO PAST SURGERIES      Family History  Problem Relation Age of Onset  . Hypertension Mother   . Cancer Mother 7       Common Bile Duct  . Hypertension Sister   . Hypertension Brother   . Diabetes Paternal Grandmother   . Cancer Paternal Grandmother 76       brain cancer  . Diabetes Paternal Grandfather   . Hypertension Father   . Cancer Father 75       kidney  cancer    Social History   Tobacco Use  . Smoking status: Never Smoker  . Smokeless tobacco: Never Used  Substance Use Topics  . Alcohol use: No    Alcohol/week: 0.0 standard drinks     Current Outpatient Medications:  .  amLODipine (NORVASC) 10 MG tablet, TAKE 1 TABLET BY MOUTH EVERY DAY, Disp: 30 tablet, Rfl: 1 .  Cyanocobalamin (B-12) 1000 MCG SUBL, Place 1 tablet under the tongue daily., Disp: 100 tablet, Rfl: 1 .  desogestrel-ethinyl estradiol (KARIVA) 0.15-0.02/0.01 MG (21/5) tablet, Take 1 tablet by mouth daily., Disp: 84 tablet, Rfl: 3 .  hydrochlorothiazide (HYDRODIURIL) 12.5 MG tablet, Take 1 tablet (12.5 mg total) by mouth daily., Disp: 90 tablet, Rfl: 0 .  Vitamin D, Ergocalciferol, (DRISDOL) 1.25 MG (50000 UNIT) CAPS capsule, TAKE 1 CAPSULE (50,000 UNITS TOTAL) BY MOUTH EVERY 7 (SEVEN) DAYS., Disp: 12 capsule, Rfl: 0  No Known Allergies  I personally reviewed {Reviewed:14835} with the patient/caregiver today.   ROS  ***  Objective  There were no vitals filed for this visit.  There is no height or weight on file to calculate BMI.  Physical Exam ***  Recent Results (from the past 2160 hour(s))  Cytology - PAP     Status: None   Collection Time: 06/28/20 12:08 PM  Result Value Ref Range   Adequacy      Satisfactory for evaluation; transformation zone component PRESENT.   Diagnosis      - Negative for Intraepithelial Lesions or Malignancy (NILM)   Diagnosis - Benign reactive/reparative changes     Diabetic Foot Exam: Diabetic Foot Exam - Simple   No data filed    ***  PHQ2/9: Depression screen [facility] 2/9 04/22/2020 11/22/2019 10/17/2019 07/17/2019 04/04/2019  Decreased Interest 0 0 0 0 0  Down, Depressed, Hopeless 0 0 0 0 0  PHQ - 2 Score 0 0 0 0 0  Altered sleeping - 0 0 0 0  Tired, decreased energy - 3 0 0 0  Change in appetite - 0 0 0 0  Feeling bad or failure about yourself  - 0 0 0 0  Trouble concentrating - 0 0 0 0  Moving slowly or  fidgety/restless - 0 0 0 0  Suicidal thoughts - 0 0 0 0  PHQ-9 Score - 3 0 0 0  Difficult doing work/chores - - - Not difficult at all -  Some recent data might be hidden    phq 9 is {gen pos DDU:202542} ***  Fall Risk: Fall Risk  04/22/2020 11/22/2019 10/17/2019 07/17/2019 04/04/2019  Falls in the past year? 0 0 0 0 0  Number falls in past yr: 0 0 0 0 0  Injury with Fall? 0 0 0 0 0   ***   Functional Status Survey:   ***   Assessment & Plan  *** There are no diagnoses linked to this encounter.

## 2020-07-22 ENCOUNTER — Ambulatory Visit: Payer: Medicaid Other | Admitting: Family Medicine

## 2020-08-07 ENCOUNTER — Other Ambulatory Visit: Payer: Self-pay | Admitting: Family Medicine

## 2020-08-07 DIAGNOSIS — I10 Essential (primary) hypertension: Secondary | ICD-10-CM

## 2020-08-18 DIAGNOSIS — Z419 Encounter for procedure for purposes other than remedying health state, unspecified: Secondary | ICD-10-CM | POA: Diagnosis not present

## 2020-09-01 ENCOUNTER — Other Ambulatory Visit: Payer: Self-pay | Admitting: Family Medicine

## 2020-09-01 DIAGNOSIS — I10 Essential (primary) hypertension: Secondary | ICD-10-CM

## 2020-09-16 ENCOUNTER — Other Ambulatory Visit: Payer: Self-pay | Admitting: Family Medicine

## 2020-09-16 DIAGNOSIS — E559 Vitamin D deficiency, unspecified: Secondary | ICD-10-CM

## 2020-09-16 DIAGNOSIS — I10 Essential (primary) hypertension: Secondary | ICD-10-CM

## 2020-09-17 NOTE — Telephone Encounter (Signed)
appt sch'd and pt informed that prescription has been sent to pharmacy

## 2020-09-18 DIAGNOSIS — Z419 Encounter for procedure for purposes other than remedying health state, unspecified: Secondary | ICD-10-CM | POA: Diagnosis not present

## 2020-10-01 NOTE — Progress Notes (Deleted)
Name: Lori Gross   MRN: [id number]    DOB: Oct 14, 1995   Date:10/01/2020       Progress Note  Subjective  Chief Complaint  Follow up/Medication refill   HPI HTN: bp today is borderline, taking norvasc 5 mg, not on aldactizide since last visit, we will resume diuretic but just hctz today. She denies chest pain , palpitation or sob   Dyslipidemia: last LDL was 137 , she states diet is out of control now, she prefers waiting to recheck labs, she will resume a healthier diet    Migraine : she states migraine was associated with photophobia and phonophobia, throbbing like and usually on right side of her head. Unchanged     Weight loss: since March last year she stopped drinking sodas, has been working two jobs at [employer] full time and also [employer] part time, has to small children at home. She is always moving,  Lost about 60 pounds, weight back in August was down to 172 lbs but she states since Thanksgiving she has not been compliant with a healthy diet and has gained it again, up to 198.9 lbs.    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   Microalbuminuria 10/08/2014   Obesity (BMI 30.0-34.9) 10/08/2014   Migraine without aura and responsive to treatment 10/08/2014    Past Surgical History:  Procedure Laterality Date   NO PAST SURGERIES      Family History  Problem Relation Age of Onset   Hypertension Mother    Cancer Mother 41       Common Bile Duct   Hypertension Sister    Hypertension Brother    Diabetes Paternal Grandmother    Cancer Paternal Grandmother 66       brain cancer   Diabetes Paternal Grandfather    Hypertension Father    Cancer Father 77       kidney  cancer    Social History   Tobacco Use   Smoking status: Never   Smokeless tobacco: Never  Substance Use Topics   Alcohol use: No    Alcohol/week: 0.0 standard drinks     Current Outpatient Medications:    amLODipine (NORVASC) 10 MG tablet, TAKE 1 TABLET BY MOUTH EVERY DAY, Disp: 30 tablet, Rfl: 1   Cyanocobalamin (B-12) 1000 MCG SUBL, Place 1 tablet under the tongue daily., Disp: 100 tablet, Rfl: 1   desogestrel-ethinyl estradiol (KARIVA) 0.15-0.02/0.01 MG (21/5) tablet, Take 1 tablet by mouth daily., Disp: 84 tablet, Rfl: 3   hydrochlorothiazide (HYDRODIURIL) 12.5 MG tablet, TAKE 1 TABLET BY MOUTH EVERY DAY, Disp: 30 tablet, Rfl: 0   Vitamin D, Ergocalciferol, (DRISDOL) 1.25 MG (50000 UNIT) CAPS capsule, TAKE 1 CAPSULE (50,000 UNITS TOTAL) BY MOUTH EVERY 7 (SEVEN) DAYS., Disp: 12 capsule, Rfl: 0  No Known Allergies  I personally reviewed {Reviewed:14835} with the patient/caregiver today.   ROS  ***  Objective  There were no vitals filed for this visit.  There is no height or weight on file to calculate BMI.  Physical Exam ***  No results found for this or any previous visit (from the past 2160 hour(s)).  Diabetic Foot Exam: Diabetic Foot Exam - Simple  No data filed    ***  PHQ2/9: Depression screen [facility] 2/9 04/22/2020 11/22/2019 10/17/2019 07/17/2019 04/04/2019  Decreased Interest 0 0 0 0 0  Down, Depressed, Hopeless 0 0 0 0 0  PHQ - 2 Score 0 0 0 0 0  Altered sleeping - 0 0 0 0  Tired, decreased energy - 3 0 0 0  Change in appetite - 0 0 0 0  Feeling bad or failure about yourself  - 0 0 0 0  Trouble concentrating - 0 0 0 0  Moving slowly or fidgety/restless - 0 0 0 0  Suicidal thoughts - 0 0 0 0  PHQ-9 Score - 3 0 0 0  Difficult doing work/chores - - - Not difficult at all -  Some recent data might be hidden    phq 9 is {gen pos AXE:940768}  Fall Risk: Fall Risk  04/22/2020 11/22/2019 10/17/2019 07/17/2019 04/04/2019  Falls in the past year? 0 0 0 0 0   Number falls in past yr: 0 0 0 0 0  Injury with Fall? 0 0 0 0 0   ***   Functional Status Survey:     Assessment & Plan  There are no diagnoses linked to this encounter.

## 2020-10-02 ENCOUNTER — Ambulatory Visit: Payer: Medicaid Other | Admitting: Family Medicine

## 2020-10-03 NOTE — Progress Notes (Signed)
Name: Lori Gross   MRN: [id number]    DOB: 03-Jan-1996   Date:10/04/2020       Progress Note  Subjective  Chief Complaint  Medication refill   HPI  HTN: bp today is borderline, taking norvasc 5 mg, she is also on HCTZ but with the weight gain her bp is trending up, we will put her back on aldactazide and stop hctz . Marland Kitchen She denies chest pain , palpitation or sob   Dyslipidemia: last LDL was 137 , she states diet is out of control now, she would like to hold off until her CPE   Migraine : she states migraine was associated with photophobia and phonophobia, throbbing like and usually on right side of her head. She states no episodes in the past couple of months. She takes prn medication otc , usually improves with Tylenol   Obesity: she lost of weight when she stopped drinking sodas 2021 ,she works at [employer] full time she also has  has to small children at home. She is always moving,  She had lost about 60 pounds, weight back in August was down to 172 lbs but she states since Thanksgiving 2021 she has not been compliant with a healthy diet and has gained it again, today is 218 lbs    Abnormal pap: seen by gyn, and pap smear was back to normal and negative HPV, she goes yearly for follow ups   B12 and Vitamin D; she is taking supplements again, we will recheck next visit    Dry cough: she noticed some post-nasal drainage yesterday and a dry cough, no fever or chills, denies fatigue. 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   Microalbuminuria 10/08/2014   Obesity (BMI 30.0-34.9) 10/08/2014   Migraine without aura and responsive to treatment 10/08/2014    Past Surgical History:  Procedure Laterality Date   NO PAST SURGERIES      Family History   Problem Relation Age of Onset   Hypertension Mother    Cancer Mother 48       Common Bile Duct   Hypertension Sister    Hypertension Brother    Diabetes Paternal Grandmother    Cancer Paternal Grandmother 38       brain cancer   Diabetes Paternal Grandfather    Hypertension Father    Cancer Father 36       kidney cancer    Social History   Tobacco Use   Smoking status: Never   Smokeless tobacco: Never  Substance Use Topics   Alcohol use: No    Alcohol/week: 0.0 standard drinks     Current Outpatient Medications:    amLODipine (NORVASC) 10 MG tablet, TAKE 1 TABLET BY MOUTH EVERY DAY, Disp: 30 tablet, Rfl: 1   Cyanocobalamin (B-12) 1000 MCG SUBL, Place 1 tablet under the tongue daily., Disp: 100 tablet, Rfl: 1   desogestrel-ethinyl estradiol (KARIVA) 0.15-0.02/0.01 MG (21/5) tablet, Take 1 tablet by mouth daily., Disp: 84 tablet, Rfl: 3   hydrochlorothiazide (HYDRODIURIL) 12.5 MG tablet, TAKE 1 TABLET BY MOUTH EVERY DAY, Disp: 30 tablet, Rfl: 0   Vitamin D, Ergocalciferol, (DRISDOL) 1.25 MG (50000 UNIT) CAPS capsule, TAKE 1 CAPSULE (50,000 UNITS TOTAL) BY MOUTH EVERY 7 (SEVEN) DAYS., Disp: 12 capsule, Rfl: 0  No Known Allergies  I personally reviewed active problem list, medication list, allergies, family history, social history, health maintenance with the patient/caregiver today.   ROS  Constitutional: Negative for fever or weight change.  Respiratory: Negative for cough and shortness of breath.   Cardiovascular: Negative for chest pain or palpitations.  Gastrointestinal: Negative for abdominal pain, no bowel changes.  Musculoskeletal: Negative for gait problem or joint swelling.  Skin: Negative for rash.  Neurological: Negative for dizziness or headache.  No other specific complaints in a complete review of systems (except as listed in HPI above).   Objective  Vitals:   10/04/20 1200  BP: 132/80  Pulse: 100  Resp: 16  Temp: 98.1 F (36.7 C)  TempSrc: Oral   SpO2: 99%  Weight: 218 lb 9.6 oz (99.2 kg)  Height: 5\' 5"  (1.651 m)    Body mass index is 36.38 kg/m.  Physical Exam  Constitutional: Patient appears well-developed and well-nourished. Obese  No distress.  HEENT: head atraumatic, normocephalic, pupils equal and reactive to light, ears normal TM, neck supple, throat within normal limits Cardiovascular: Normal rate, regular rhythm and normal heart sounds.  No murmur heard. No BLE edema. Pulmonary/Chest: Effort normal and breath sounds normal. No respiratory distress. Abdominal: Soft.  There is no tenderness. Psychiatric: Patient has a normal mood and affect. behavior is normal. Judgment and thought content normal.    PHQ2/9: Depression screen [facility] 2/9 10/04/2020 04/22/2020 11/22/2019 10/17/2019 07/17/2019  Decreased Interest 0 0 0 0 0  Down, Depressed, Hopeless 0 0 0 0 0  PHQ - 2 Score 0 0 0 0 0  Altered sleeping - - 0 0 0  Tired, decreased energy - - 3 0 0  Change in appetite - - 0 0 0  Feeling bad or failure about yourself  - - 0 0 0  Trouble concentrating - - 0 0 0  Moving slowly or fidgety/restless - - 0 0 0  Suicidal thoughts - - 0 0 0  PHQ-9 Score - - 3 0 0  Difficult doing work/chores - - - - Not difficult at all  Some recent data might be hidden    phq 9 is negative  Fall Risk: Fall Risk  10/04/2020 04/22/2020 11/22/2019 10/17/2019 07/17/2019  Falls in the past year? 0 0 0 0 0  Number falls in past yr: 0 0 0 0 0  Injury with Fall? 0 0 0 0 0  Follow up Falls prevention discussed - - - -      Functional Status Survey: Is the patient deaf or have difficulty hearing?: No Does the patient have difficulty seeing, even when wearing glasses/contacts?: No Does the patient have difficulty concentrating, remembering, or making decisions?: No Does the patient have difficulty walking or climbing stairs?: No Does the patient have difficulty dressing or bathing?: No Does the patient have difficulty doing errands alone such as visiting a  doctor's office or shopping?: No    Assessment & Plan  1. Hypertension, benign  Adjusting medication  - amLODipine (NORVASC) 10 MG tablet; Take 1 tablet (10 mg total) by mouth daily.  Dispense: 90 tablet; Refill: 1 - triamterene-hydrochlorothiazide (MAXZIDE-25) 37.5-25 MG tablet; Take 1 tablet by mouth daily.  Dispense: 90 tablet; Refill: 0  2. Dyslipidemia  Recheck next visit   3. Vitamin D deficiency  Continue supplements   4. Migraine without aura and responsive to treatment  Doing well at this time  5. B12 deficiency  Continue supplementation   6. Dry cough  Advised saline spray and otc coricidin hbp  7. Left wrist pain  Discussed ice, brace    8. Morbid obesity (HCC)  BMI above 35 with co morbidities, she will try to stop drinking sodas again

## 2020-10-04 ENCOUNTER — Ambulatory Visit: Payer: Medicaid Other | Admitting: Family Medicine

## 2020-10-04 ENCOUNTER — Other Ambulatory Visit: Payer: Self-pay

## 2020-10-04 ENCOUNTER — Encounter: Payer: Self-pay | Admitting: Family Medicine

## 2020-10-04 VITALS — BP 132/80 | HR 100 | Temp 98.1°F | Resp 16 | Ht 65.0 in | Wt 218.6 lb

## 2020-10-04 DIAGNOSIS — G43009 Migraine without aura, not intractable, without status migrainosus: Secondary | ICD-10-CM | POA: Diagnosis not present

## 2020-10-04 DIAGNOSIS — R058 Other specified cough: Secondary | ICD-10-CM | POA: Diagnosis not present

## 2020-10-04 DIAGNOSIS — E785 Hyperlipidemia, unspecified: Secondary | ICD-10-CM | POA: Diagnosis not present

## 2020-10-04 DIAGNOSIS — E538 Deficiency of other specified B group vitamins: Secondary | ICD-10-CM | POA: Diagnosis not present

## 2020-10-04 DIAGNOSIS — E559 Vitamin D deficiency, unspecified: Secondary | ICD-10-CM | POA: Diagnosis not present

## 2020-10-04 DIAGNOSIS — I10 Essential (primary) hypertension: Secondary | ICD-10-CM

## 2020-10-04 DIAGNOSIS — M25532 Pain in left wrist: Secondary | ICD-10-CM

## 2020-10-04 MED ORDER — TRIAMTERENE-HCTZ 37.5-25 MG PO TABS: 1.0000 | ORAL_TABLET | Freq: Every day | ORAL | 0 refills | Status: DC

## 2020-10-04 MED ORDER — AMLODIPINE BESYLATE 10 MG PO TABS: 1.0000 | ORAL_TABLET | Freq: Every day | ORAL | 1 refills | Status: DC

## 2020-10-04 NOTE — Patient Instructions (Signed)
Tendinitis  Tendinitis is inflammation of a tendon. A tendon is a strong cord of tissuethat connects muscle to bone. Tendinitis can affect any tendon, but it most commonly affects the: Shoulder tendon (rotator cuff). Ankle tendon (Achilles tendon). Elbow tendon (triceps tendon). Tendons in the wrist. What are the causes? This condition may be caused by: Overusing a tendon or muscle. This is common. Age-related wear and tear. Injury. Inflammatory conditions, such as arthritis. Certain medicines. What increases the risk? You are more likely to develop this condition if you do activities that involve the same movements over and over again (repetitive motions). What are the signs or symptoms? Symptoms of this condition may include: Pain. Tenderness. Mild swelling. Decreased range of motion. How is this diagnosed? This condition is diagnosed with a physical exam. You may also have tests, such as: Ultrasound. This uses sound waves to make an image of the inside of your body in the affected area. MRI. How is this treated? This condition may be treated by resting, icing, applying pressure (compression), and raising (elevating) the affected area above the level of your heart. This is known as RICE therapy. Treatment may also include: Medicines to help reduce inflammation or to help reduce pain. Exercises or physical therapy to strengthen and stretch the tendon. A brace or splint. Surgery. This is rarely needed. Follow these instructions at home: If you have a splint or brace: Wear the splint or brace as told by your health care provider. Remove it only as told by your health care provider. Loosen the splint or brace if your fingers or toes tingle, become numb, or turn cold and blue. Keep the splint or brace clean. If the splint or brace is not waterproof: Do not let it get wet. Cover it with a watertight covering when you take a bath or shower. Managing pain, stiffness, and  swelling If directed, put ice on the affected area. If you have a removable splint or brace, remove it as told by your health care provider. Put ice in a plastic bag. Place a towel between your skin and the bag. Leave the ice on for 20 minutes, 2-3 times a day. Move the fingers or toes of the affected limb often, if this applies. This can help to prevent stiffness and lessen swelling. If directed, raise (elevate) the affected area above the level of your heart while you are sitting or lying down. If directed, apply heat to the affected area before you exercise. Use the heat source that your health care provider recommends, such as a moist heat pack or a heating pad.     Place a towel between your skin and the heat source. Leave the heat on for 20-30 minutes. Remove the heat if your skin turns bright red. This is especially important if you are unable to feel pain, heat, or cold. You may have a greater risk of getting burned.  Driving Do not drive or use heavy machinery while taking prescription pain medicine. Ask your health care provider when it is safe to drive if you have a splint or brace on any part of your arm or leg. Activity Rest the affected area as told by your health care provider. Return to your normal activities as told by your health care provider. Ask your health care provider what activities are safe for you. Avoid using the affected area while you are experiencing symptoms of tendinitis. Do exercises as told by your health care provider. General instructions If you have a splint,   do not put pressure on any part of the splint until it is fully hardened. This may take several hours. Wear an elastic bandage or compression wrap only as told by your health care provider. Take over-the-counter and prescription medicines only as told by your health care provider. Keep all follow-up visits as told by your health care provider. This is important. Contact a health care provider  if: Your symptoms do not improve. You develop new, unexplained problems, such as numbness in your hands. Summary Tendinitis is inflammation of a tendon. You are more likely to develop this condition if you do activities that involve the same movements over and over again. This condition may be treated by resting, icing, applying pressure (compression), and elevating the area above the level of your heart. This is known as RICE therapy. Avoid using the affected area while you are experiencing symptoms of tendinitis. This information is not intended to replace advice given to you by your health care provider. Make sure you discuss any questions you have with your healthcare provider. Document Revised: 10/12/2017 Document Reviewed: 08/25/2017 Elsevier Patient Education  2022 Elsevier Inc.  

## 2020-10-07 ENCOUNTER — Other Ambulatory Visit: Payer: Self-pay | Admitting: Family Medicine

## 2020-10-07 ENCOUNTER — Telehealth: Payer: Self-pay

## 2020-10-07 DIAGNOSIS — E559 Vitamin D deficiency, unspecified: Secondary | ICD-10-CM

## 2020-10-07 NOTE — Telephone Encounter (Signed)
Patient called to clarify she was to take norvasc as well as the new bp medication prescribed at her last visit. Dr. Carlynn Purl confirmed and patient verbalized understanding and agreement.

## 2020-10-07 NOTE — Telephone Encounter (Signed)
Requested medication (s) are due for refill today: yes  Requested medication (s) are on the active medication list: yes   Last refill: 07/06/2020  Future visit scheduled: yes   Notes to clinic: this refill cannot be delegated    Requested Prescriptions  Pending Prescriptions Disp Refills   Vitamin D, Ergocalciferol, (DRISDOL) 1.25 MG (50000 UNIT) CAPS capsule [Pharmacy Med Name: VITAMIN D2 1.25MG (50,000 UNIT)] 12 capsule 0    Sig: TAKE 1 CAPSULE (50,000 UNITS TOTAL) BY MOUTH EVERY 7 (SEVEN) DAYS.      Endocrinology:  Vitamins - Vitamin D Supplementation Failed - 10/07/2020  2:15 PM      Failed - 50,000 IU strengths are not delegated      Failed - Phosphate in normal range and within 360 days    Phosphorus  Date Value Ref Range Status  09/01/2016 4.7 (H) 2.5 - 4.5 mg/dL Final          Passed - Ca in normal range and within 360 days    Calcium  Date Value Ref Range Status  04/04/2020 9.0 8.9 - 10.3 mg/dL Final          Passed - Vitamin D in normal range and within 360 days    Vit D, 25-Hydroxy  Date Value Ref Range Status  11/22/2019 52 30 - 100 ng/mL Final    Comment:    Vitamin D Status         25-OH Vitamin D: . Deficiency:                    <20 ng/mL Insufficiency:             20 - 29 ng/mL Optimal:                 > or = 30 ng/mL . For 25-OH Vitamin D testing on patients on  D2-supplementation and patients for whom quantitation  of D2 and D3 fractions is required, the [employer]ureD(TM) 25-OH VIT D, (D2,D3), LC/MS/MS is recommended: order  code 29924 (patients >57yrs). See Note 1 . Note 1 . For additional information, please refer to  http://education.Quest[facility].com/faq/FAQ199  (This link is being provided for informational/ educational purposes only.)           Passed - Valid encounter within last 12 months    Recent Outpatient Visits           3 days ago Hypertension, benign   [facility] Alba Cory, MD   5 months ago  Need for immunization against influenza   [facility] Alba Cory, MD   10 months ago Urinary tract infection with hematuria, site unspecified   [facility] Alba Cory, MD   11 months ago Hypertension, benign   [facility] Alba Cory, MD   1 year ago Hypertension, benign   [facility] Alba Cory, MD       Future Appointments             In 3 months Carlynn Purl, Danna Hefty, MD [facility]   In 8 months Logan Bores, Ellsworth Lennox, MD [facility]

## 2020-10-18 DIAGNOSIS — Z419 Encounter for procedure for purposes other than remedying health state, unspecified: Secondary | ICD-10-CM | POA: Diagnosis not present

## 2020-11-18 DIAGNOSIS — Z419 Encounter for procedure for purposes other than remedying health state, unspecified: Secondary | ICD-10-CM | POA: Diagnosis not present

## 2020-11-21 ENCOUNTER — Other Ambulatory Visit: Payer: Self-pay | Admitting: Family Medicine

## 2020-11-21 DIAGNOSIS — E559 Vitamin D deficiency, unspecified: Secondary | ICD-10-CM

## 2020-12-03 ENCOUNTER — Ambulatory Visit: Payer: Medicaid Other | Admitting: Family Medicine

## 2020-12-19 DIAGNOSIS — Z419 Encounter for procedure for purposes other than remedying health state, unspecified: Secondary | ICD-10-CM | POA: Diagnosis not present

## 2021-01-06 ENCOUNTER — Telehealth (INDEPENDENT_AMBULATORY_CARE_PROVIDER_SITE_OTHER): Payer: Medicaid Other | Admitting: Family Medicine

## 2021-01-06 ENCOUNTER — Encounter: Payer: Self-pay | Admitting: Family Medicine

## 2021-01-06 VITALS — Ht 64.0 in | Wt 218.0 lb

## 2021-01-06 DIAGNOSIS — Z862 Personal history of diseases of the blood and blood-forming organs and certain disorders involving the immune mechanism: Secondary | ICD-10-CM | POA: Diagnosis not present

## 2021-01-06 DIAGNOSIS — I10 Essential (primary) hypertension: Secondary | ICD-10-CM | POA: Diagnosis not present

## 2021-01-06 DIAGNOSIS — E785 Hyperlipidemia, unspecified: Secondary | ICD-10-CM | POA: Diagnosis not present

## 2021-01-06 DIAGNOSIS — E538 Deficiency of other specified B group vitamins: Secondary | ICD-10-CM

## 2021-01-06 DIAGNOSIS — Z131 Encounter for screening for diabetes mellitus: Secondary | ICD-10-CM | POA: Diagnosis not present

## 2021-01-06 DIAGNOSIS — G43009 Migraine without aura, not intractable, without status migrainosus: Secondary | ICD-10-CM

## 2021-01-06 DIAGNOSIS — E559 Vitamin D deficiency, unspecified: Secondary | ICD-10-CM | POA: Diagnosis not present

## 2021-01-06 NOTE — Progress Notes (Signed)
Name: Lori Gross   MRN: [id number]    DOB: Jan 26, 1996   Date:01/06/2021       Progress Note  Subjective  Chief Complaint  Chief Complaint  Patient presents with   Follow-up    I connected with  Lori Gross  on 01/06/21 at  3:20 PM EDT by a video enabled telemedicine application and verified that I am speaking with the correct person using two identifiers.  I discussed the limitations of evaluation and management by telemedicine and the availability of in person appointments. The patient expressed understanding and agreed to proceed with the virtual visit  Staff also discussed with the patient that there may be a patient responsible charge related to this service. Patient Location: at home  Provider Location: at home  Additional Individuals present: alone   HPI  HTN: bp today is borderline, taking norvasc 5 mg and triamterene HCTZ and denies side effects.  She denies chest pain , palpitation or sob   Dyslipidemia: last LDL was 137 , she states diet is out of control now, she will return for labs    Migraine : she states migraine was associated with photophobia and phonophobia, throbbing like and usually on right side of her head. She states no episodes in the past fe months.  She takes prn medication otc , usually improves with Tylenol .   Obesity: she lost of weight when she stopped drinking sodas 2021 ,she works at [employer] full time she also has  has to small children at home. She is always moving,  She had lost about 60 pounds, weight back in August was down to 172 lbs but she states since Thanksgiving 2021 she has not been compliant with a healthy diet and has gained it again, she has not been able to check her weight lately, she will come to our office so we can check it for her    Abnormal pap: seen by gyn, and pap smear was back to normal and negative HPV, she goes yearly for follow ups   B12 and Vitamin D; she is taking supplements again, we will recheck  labs this week.    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   Microalbuminuria 10/08/2014   Obesity (BMI 30.0-34.9) 10/08/2014   Migraine without aura and responsive to treatment 10/08/2014    Past Surgical History:  Procedure Laterality Date   NO PAST SURGERIES      Family History  Problem Relation Age of Onset   Hypertension Mother    Cancer Mother 67       Common Bile Duct   Hypertension Sister    Hypertension Brother    Diabetes Paternal Grandmother    Cancer Paternal Grandmother 52       brain cancer   Diabetes Paternal Grandfather    Hypertension Father    Cancer Father 75       kidney cancer        Current Outpatient Medications:    amLODipine (NORVASC) 10 MG tablet, Take 1 tablet (10 mg total) by mouth daily., Disp: 90 tablet, Rfl: 1   Cyanocobalamin (B-12) 1000 MCG SUBL, Place 1 tablet under the tongue daily., Disp: 100 tablet, Rfl: 1   desogestrel-ethinyl estradiol (KARIVA) 0.15-0.02/0.01 MG (21/5) tablet, Take 1 tablet by mouth daily., Disp: 84 tablet, Rfl: 3   triamterene-hydrochlorothiazide (MAXZIDE-25)  37.5-25 MG tablet, Take 1 tablet by mouth daily., Disp: 90 tablet, Rfl: 0   Vitamin D, Ergocalciferol, (DRISDOL) 1.25 MG (50000 UNIT) CAPS capsule, TAKE 1 CAPSULE (50,000 UNITS TOTAL) BY MOUTH EVERY 7 (SEVEN) DAYS, Disp: 12 capsule, Rfl: 0  No Known Allergies  I personally reviewed active problem list, medication list, allergies with the patient/caregiver today.   ROS  Ten systems reviewed and is negative except as mentioned in HPI   Objective  Virtual encounter, vitals not obtained.  Body mass index is 37.42 kg/m.  Physical Exam  Awake, alert and oriented   PHQ2/9: Depression screen [facility] 2/9 01/06/2021 10/04/2020 04/22/2020 11/22/2019 10/17/2019  Decreased Interest 0 0 0 0 0  Down,  Depressed, Hopeless 0 0 0 0 0  PHQ - 2 Score 0 0 0 0 0  Altered sleeping - - - 0 0  Tired, decreased energy - - - 3 0  Change in appetite - - - 0 0  Feeling bad or failure about yourself  - - - 0 0  Trouble concentrating - - - 0 0  Moving slowly or fidgety/restless - - - 0 0  Suicidal thoughts - - - 0 0  PHQ-9 Score - - - 3 0  Difficult doing work/chores - - - - -  Some recent data might be hidden   PHQ-2/9 Result is negative.    Fall Risk: Fall Risk  01/06/2021 10/04/2020 04/22/2020 11/22/2019 10/17/2019  Falls in the past year? 0 0 0 0 0  Number falls in past yr: - 0 0 0 0  Injury with Fall? - 0 0 0 0  Follow up - Falls prevention discussed - - -     Assessment & Plan  1. Hypertension, benign  - CBC with Differential/Platelet - COMPLETE METABOLIC PANEL WITH GFR - TSH  2. Dyslipidemia  - Lipid panel  3. B12 deficiency  - Vitamin B12  4. Morbid obesity (HCC)  Discussed with the patient the risk posed by an increased BMI. Discussed importance of portion control, calorie counting and at least 150 minutes of physical activity weekly. Avoid sweet beverages and drink more water. Eat at least 6 servings of fruit and vegetables daily    5. Vitamin D deficiency  - VITAMIN D 25 Hydroxy (Vit-D Deficiency, Fractures)  6. Migraine without aura and responsive to treatment   7. History of iron deficiency anemia  Recheck level   8. Diabetes mellitus screening  - Hemoglobin A1c   I discussed the assessment and treatment plan with the patient. The patient was provided an opportunity to ask questions and all were answered. The patient agreed with the plan and demonstrated an understanding of the instructions.  The patient was advised to call back or seek an in-person evaluation if the symptoms worsen or if the condition fails to improve as anticipated.  I provided 25 minutes of non-face-to-face time during this encounter.

## 2021-01-18 DIAGNOSIS — Z419 Encounter for procedure for purposes other than remedying health state, unspecified: Secondary | ICD-10-CM | POA: Diagnosis not present

## 2021-01-25 ENCOUNTER — Other Ambulatory Visit: Payer: Self-pay | Admitting: Family Medicine

## 2021-01-25 DIAGNOSIS — I10 Essential (primary) hypertension: Secondary | ICD-10-CM

## 2021-01-25 NOTE — Telephone Encounter (Signed)
Requested Prescriptions  Pending Prescriptions Disp Refills  . triamterene-hydrochlorothiazide (MAXZIDE-25) 37.5-25 MG tablet [Pharmacy Med Name: TRIAMTERENE-HCTZ 37.5-25 MG TB] 90 tablet 0    Sig: TAKE 1 TABLET BY MOUTH EVERY DAY     Cardiovascular: Diuretic Combos Failed - 01/25/2021 11:36 AM      Failed - K in normal range and within 360 days    Potassium  Date Value Ref Range Status  04/04/2020 3.1 (L) 3.5 - 5.1 mmol/L Final         Passed - Na in normal range and within 360 days    Sodium  Date Value Ref Range Status  04/04/2020 137 135 - 145 mmol/L Final  09/01/2016 139 134 - 144 mmol/L Final         Passed - Cr in normal range and within 360 days    Creat  Date Value Ref Range Status  11/22/2019 0.70 0.50 - 1.10 mg/dL Final   Creatinine, Ser  Date Value Ref Range Status  04/04/2020 0.91 0.44 - 1.00 mg/dL Final   Creatinine, Urine  Date Value Ref Range Status  07/17/2019 88 20 - 275 mg/dL Final         Passed - Ca in normal range and within 360 days    Calcium  Date Value Ref Range Status  04/04/2020 9.0 8.9 - 10.3 mg/dL Final         Passed - Last BP in normal range    BP Readings from Last 1 Encounters:  10/04/20 132/80         Passed - Valid encounter within last 6 months    Recent Outpatient Visits          2 weeks ago Hypertension, benign   [facility] Alba Cory, MD   3 months ago Hypertension, benign   [facility] Alba [facility], MD   9 months ago Need for immunization against influenza   [facility] Alba [facility], MD   1 year ago Urinary tract infection with hematuria, site unspecified   [facility] Al[facility], MD   1 year ago Hypertension, benign   [facility] Alba Cory, MD      Future Appointments            In 5 months Logan Bores, Ellsworth Lennox, MD [facility]

## 2021-02-05 ENCOUNTER — Telehealth: Payer: Self-pay

## 2021-02-05 NOTE — Telephone Encounter (Signed)
Pt aware and gave verbal understanding 

## 2021-02-05 NOTE — Telephone Encounter (Signed)
Copied from CRM [id number]. Topic: General - Other >> Feb 05, 2021  7:55 AM Jaquita Rector A wrote: Reason for CRM: Patient called in to inform Dr Carlynn Purl that she is taking her birth control pills but have not had her actual cycle have been cramping and spotting but not fully bleeding. Patient want to know if she need to be concerned asking for a call back at Ph# [phone]

## 2021-02-18 DIAGNOSIS — Z419 Encounter for procedure for purposes other than remedying health state, unspecified: Secondary | ICD-10-CM | POA: Diagnosis not present

## 2021-03-20 DIAGNOSIS — Z419 Encounter for procedure for purposes other than remedying health state, unspecified: Secondary | ICD-10-CM | POA: Diagnosis not present

## 2021-03-23 ENCOUNTER — Other Ambulatory Visit: Payer: Self-pay | Admitting: Family Medicine

## 2021-03-23 DIAGNOSIS — E559 Vitamin D deficiency, unspecified: Secondary | ICD-10-CM

## 2021-04-20 DIAGNOSIS — Z419 Encounter for procedure for purposes other than remedying health state, unspecified: Secondary | ICD-10-CM | POA: Diagnosis not present

## 2021-04-27 ENCOUNTER — Other Ambulatory Visit: Payer: Self-pay | Admitting: Family Medicine

## 2021-04-27 DIAGNOSIS — I10 Essential (primary) hypertension: Secondary | ICD-10-CM

## 2021-04-27 NOTE — Telephone Encounter (Signed)
Requested Prescriptions  Pending Prescriptions Disp Refills   amLODipine (NORVASC) 10 MG tablet [Pharmacy Med Name: AMLODIPINE BESYLATE 10 MG TAB] 90 tablet 1    Sig: TAKE 1 TABLET BY MOUTH EVERY DAY     Cardiovascular:  Calcium Channel Blockers Passed - 04/27/2021  3:09 PM      Passed - Last BP in normal range    BP Readings from Last 1 Encounters:  10/04/20 132/80         Passed - Valid encounter within last 6 months    Recent Outpatient Visits          3 months ago Hypertension, benign   [facility] Steele Sizer, MD   6 months ago Hypertension, benign   [facility] Steele Sizer, MD   1 year ago Need for immunization against influenza   [facility] Steele Sizer, [facility]   1 year ago Urinary tract infection with hematuria, site unspecified   [facility] Steele Sizer, MD   1 year ago Hypertension, benign   [facility] Steele Sizer, MD      Future Appointments            In 2 months Amalia Hailey, Nyoka Lint, MD [facility]

## 2021-05-11 ENCOUNTER — Other Ambulatory Visit: Payer: Self-pay | Admitting: Family Medicine

## 2021-05-11 DIAGNOSIS — I10 Essential (primary) hypertension: Secondary | ICD-10-CM

## 2021-05-11 NOTE — Telephone Encounter (Signed)
Last RF 01/25/21 #90 No Future visit scheduled Needs appt- message sent through MyChart  Requested Prescriptions  Pending Prescriptions Disp Refills   triamterene-hydrochlorothiazide (MAXZIDE-25) 37.5-25 MG tablet [Pharmacy Med Name: TRIAMTERENE-HCTZ 37.5-25 MG TB] 90 tablet 0    Sig: TAKE 1 TABLET BY MOUTH EVERY DAY     Cardiovascular: Diuretic Combos Failed - 05/11/2021 11:54 AM      Failed - K in normal range and within 360 days    Potassium  Date Value Ref Range Status  04/04/2020 3.1 (L) 3.5 - 5.1 mmol/L Final          Failed - Na in normal range and within 360 days    Sodium  Date Value Ref Range Status  04/04/2020 137 135 - 145 mmol/L Final  09/01/2016 139 134 - 144 mmol/L Final          Failed - Cr in normal range and within 360 days    Creat  Date Value Ref Range Status  11/22/2019 0.70 0.50 - 1.10 mg/dL Final   Creatinine, Ser  Date Value Ref Range Status  04/04/2020 0.91 0.44 - 1.00 mg/dL Final   Creatinine, Urine  Date Value Ref Range Status  07/17/2019 88 20 - 275 mg/dL Final          Failed - Ca in normal range and within 360 days    Calcium  Date Value Ref Range Status  04/04/2020 9.0 8.9 - 10.3 mg/dL Final          Passed - Last BP in normal range    BP Readings from Last 1 Encounters:  10/04/20 132/80          Passed - Valid encounter within last 6 months    Recent Outpatient Visits           4 months ago Hypertension, benign   [facility] Steele Sizer, MD   7 months ago Hypertension, benign   [facility] Steele Sizer, MD   1 year ago Need for immunization against influenza   [facility] Steele Sizer, MD   1 year ago Urinary tract infection with hematuria, site unspecified   [facility] Steele Sizer, MD   1 year ago Hypertension, benign   [facility] Steele Sizer, MD       Future Appointments             In 1 month  Amalia Hailey Nyoka Lint, MD [facility]

## 2021-05-12 NOTE — Telephone Encounter (Signed)
Requested medication (s) are due for refill today:   Yes  Requested medication (s) are on the active medication list:   Yes  Future visit scheduled:   Yes   Last ordered: 01/25/2021 #90, 0 refills  Returned because protocol failed due to labs being due.  DX Code and a new rx needed.   Requested Prescriptions  Pending Prescriptions Disp Refills   triamterene-hydrochlorothiazide (MAXZIDE-25) 37.5-25 MG tablet [Pharmacy Med Name: TRIAMTERENE-HCTZ 37.5-25 MG TB] 90 tablet 0    Sig: TAKE 1 TABLET BY MOUTH EVERY DAY     Cardiovascular: Diuretic Combos Failed - 05/12/2021  9:23 AM      Failed - K in normal range and within 360 days    Potassium  Date Value Ref Range Status  04/04/2020 3.1 (L) 3.5 - 5.1 mmol/L Final          Failed - Na in normal range and within 360 days    Sodium  Date Value Ref Range Status  04/04/2020 137 135 - 145 mmol/L Final  09/01/2016 139 134 - 144 mmol/L Final          Failed - Cr in normal range and within 360 days    Creat  Date Value Ref Range Status  11/22/2019 0.70 0.50 - 1.10 mg/dL Final   Creatinine, Ser  Date Value Ref Range Status  04/04/2020 0.91 0.44 - 1.00 mg/dL Final   Creatinine, Urine  Date Value Ref Range Status  07/17/2019 88 20 - 275 mg/dL Final          Failed - Ca in normal range and within 360 days    Calcium  Date Value Ref Range Status  04/04/2020 9.0 8.9 - 10.3 mg/dL Final          Passed - Last BP in normal range    BP Readings from Last 1 Encounters:  10/04/20 132/80          Passed - Valid encounter within last 6 months    Recent Outpatient Visits           4 months ago Hypertension, benign   [facility] Steele Sizer, MD   7 months ago Hypertension, benign   [facility] Steele Sizer, MD   1 year ago Need for immunization against influenza   [facility] Steele Sizer, MD   1 year ago Urinary tract infection with hematuria, site  unspecified   [facility] Steele Sizer, MD   1 year ago Hypertension, benign   [facility] Steele Sizer, MD       Future Appointments             In 1 week Kathrine Haddock, NP [facility] [place]   In 1 month Amalia Hailey, Nyoka Lint, MD [facility]

## 2021-05-12 NOTE — Telephone Encounter (Signed)
Pt called and scheduled her med refill appt for next Tuesday with Kathrine Haddock / she stated she is completely out and asked for a courtesy refill for enough to last her until her appt / please advise

## 2021-05-20 ENCOUNTER — Encounter: Payer: Self-pay | Admitting: Unknown Physician Specialty

## 2021-05-20 ENCOUNTER — Ambulatory Visit (INDEPENDENT_AMBULATORY_CARE_PROVIDER_SITE_OTHER): Payer: Medicaid Other | Admitting: Unknown Physician Specialty

## 2021-05-20 VITALS — BP 124/80 | HR 101 | Temp 98.0°F | Resp 16 | Ht 64.0 in | Wt 222.0 lb

## 2021-05-20 DIAGNOSIS — G43009 Migraine without aura, not intractable, without status migrainosus: Secondary | ICD-10-CM

## 2021-05-20 DIAGNOSIS — M25532 Pain in left wrist: Secondary | ICD-10-CM | POA: Diagnosis not present

## 2021-05-20 DIAGNOSIS — I10 Essential (primary) hypertension: Secondary | ICD-10-CM

## 2021-05-20 HISTORY — DX: Pain in left wrist: M25.532

## 2021-05-20 LAB — COMPLETE METABOLIC PANEL WITH GFR
AG Ratio: 1.4 (calc) (ref 1.0–2.5)
ALT: 11 U/L (ref 6–29)
AST: 16 U/L (ref 10–30)
Albumin: 4.3 g/dL (ref 3.6–5.1)
Alkaline phosphatase (APISO): 81 U/L (ref 31–125)
BUN: 9 mg/dL (ref 7–25)
CO2: 27 mmol/L (ref 20–32)
Calcium: 9.7 mg/dL (ref 8.6–10.2)
Chloride: 103 mmol/L (ref 98–110)
Creat: 0.68 mg/dL (ref 0.50–0.96)
Globulin: 3.1 g/dL (calc) (ref 1.9–3.7)
Glucose, Bld: 77 mg/dL (ref 65–99)
Potassium: 4.2 mmol/L (ref 3.5–5.3)
Sodium: 141 mmol/L (ref 135–146)
Total Bilirubin: 0.3 mg/dL (ref 0.2–1.2)
Total Protein: 7.4 g/dL (ref 6.1–8.1)
eGFR: 122 mL/min/{1.73_m2} (ref >=60)

## 2021-05-20 LAB — LIPID PANEL
Cholesterol: 283 mg/dL — ABNORMAL HIGH (ref <200)
HDL: 83 mg/dL (ref >=50)
LDL Cholesterol (Calc): 169 mg/dL (calc) — ABNORMAL HIGH
Non-HDL Cholesterol (Calc): 200 mg/dL (calc) — ABNORMAL HIGH (ref <130)
Total CHOL/HDL Ratio: 3.4 (calc) (ref <5.0)
Triglycerides: 159 mg/dL — ABNORMAL HIGH (ref <150)

## 2021-05-20 LAB — TSH: TSH: 1.27 mIU/L

## 2021-05-20 MED ORDER — TRIAMTERENE-HCTZ 37.5-25 MG PO TABS: 1.0000 | ORAL_TABLET | Freq: Every day | ORAL | 1 refills | Status: DC

## 2021-05-20 MED ORDER — AMLODIPINE BESYLATE 10 MG PO TABS: 10.0000 mg | ORAL_TABLET | Freq: Every day | ORAL | 1 refills | Status: DC

## 2021-05-20 NOTE — Assessment & Plan Note (Signed)
Further discussion is that she does see "dots"  Encouraged to check if BCPs are the best [facility] option for her

## 2021-05-20 NOTE — Assessment & Plan Note (Signed)
Recommended splinting which she hasn't done yet.

## 2021-05-20 NOTE — Assessment & Plan Note (Addendum)
Will refill her BP meds.  Check cholesterol and CMP.  Add physical labs she did not get done that was ordered 6 months ago

## 2021-05-20 NOTE — Progress Notes (Signed)
BP 124/80    Pulse (!) 101    Temp 98 F (36.7 C) (Oral)    Resp 16    Ht 5\' 4"  (1.626 m)    Wt 222 lb (100.7 kg)    SpO2 98%    BMI 38.11 kg/m    Subjective:    Patient ID: , female    DOB: February 08, 1996, 26 y.o.   MRN: [id number]  HPI: Lori RANDOL is a 26 y.o. female  Chief Complaint  Patient presents with   Follow-up   Hypertension   Hypertension Using medications without difficulty.  Also taking BCPs through Gyn.  Was on BP meds before starting on BCPs.  BP has been stable.  Migraines documented previously but none for the last 4 months.  Lori Gross does report black and white dots before the migraine.   Average home BPs:  Not checing   No problems or lightheadedness other than with a migraine No chest pain with exertion or shortness of breath No Edema  Wrist - states wrist is "catchy.  Works at a 34  Relevant past medical, surgical, family and social history reviewed and updated as indicated. Interim medical history since our last visit reviewed. Allergies and medications reviewed and updated.  Review of Systems  Constitutional: Negative.    Per HPI unless specifically indicated above     Objective:    BP 124/80    Pulse (!) 101    Temp 98 F (36.7 C) (Oral)    Resp 16    Ht 5\' 4"  (1.626 m)    Wt 222 lb (100.7 kg)    SpO2 98%    BMI 38.11 kg/m   Wt Readings from Last 3 Encounters:  05/20/21 222 lb (100.7 kg)  01/06/21 218 lb (98.9 kg)  10/04/20 218 lb 9.6 oz (99.2 kg)    Physical Exam Constitutional:      General: Lori Gross is not in acute distress.    Appearance: Normal appearance. Lori Gross is well-developed.  HENT:     Head: Normocephalic and atraumatic.  Eyes:     General: Lids are normal. No scleral icterus.       Right eye: No discharge.        Left eye: No discharge.     Conjunctiva/sclera: Conjunctivae normal.  Neck:     Vascular: No carotid bruit or JVD.  Cardiovascular:     Rate and Rhythm: Normal rate and regular rhythm.      Heart sounds: Normal heart sounds.  Pulmonary:     Effort: Pulmonary effort is normal. No respiratory distress.     Breath sounds: Normal breath sounds.  Abdominal:     Palpations: There is no hepatomegaly or splenomegaly.  Musculoskeletal:        General: Normal range of motion.     Cervical back: Normal range of motion and neck supple.  Skin:    General: Skin is warm and dry.     Coloration: Skin is not pale.     Findings: No rash.  Neurological:     Mental Status: Lori Gross is alert and oriented to person, place, and time.  Psychiatric:        Behavior: Behavior normal.        Thought Content: Thought content normal.        Judgment: Judgment normal.    Results for orders placed or performed in visit on 06/28/20  Cytology - PAP  Result Value Ref Range   Adequacy  Satisfactory for evaluation; transformation zone component PRESENT.   Diagnosis      - Negative for Intraepithelial Lesions or Malignancy (NILM)   Diagnosis - Benign reactive/reparative changes       Assessment & Plan:   Problem List Items Addressed This Visit       Unprioritized   Hypertension, benign    Will refill her BP meds.  Check cholesterol and CMP      Relevant Medications   amLODipine (NORVASC) 10 MG tablet   triamterene-hydrochlorothiazide (MAXZIDE-25) 37.5-25 MG tablet   Other Relevant Orders   Comprehensive metabolic panel   COMPLETE METABOLIC PANEL WITH GFR   TSH   Lipid panel   Migraine without aura and responsive to treatment    Further discussion is that Lori Gross does see "dots"  Encouraged to check if BCPs are the best [facility] option for her      Relevant Medications   amLODipine (NORVASC) 10 MG tablet   triamterene-hydrochlorothiazide (MAXZIDE-25) 37.5-25 MG tablet   Wrist pain, left    Recommended splinting which Lori Gross hasn't done yet.        Other Visit Diagnoses     Left wrist pain    -  Primary       Pap due in 1 month.  Encoraged to talk to gyn about alternative BC.    Follow  up plan: Return in about 6 months (around 11/17/2021).

## 2021-05-21 DIAGNOSIS — Z419 Encounter for procedure for purposes other than remedying health state, unspecified: Secondary | ICD-10-CM | POA: Diagnosis not present

## 2021-05-22 ENCOUNTER — Telehealth: Payer: Self-pay

## 2021-05-22 NOTE — Telephone Encounter (Signed)
Copied from Central [id number]. Topic: General - Other >> May 22, 2021  2:23 PM McGill, Nelva Bush wrote: Reason for CRM: Pt requesting a call back in regards to her lab results.

## 2021-05-22 NOTE — Telephone Encounter (Signed)
Called back pt with results, Cheryl,NP had just released results and had sent her a mychart message.

## 2021-06-09 ENCOUNTER — Ambulatory Visit: Payer: Self-pay

## 2021-06-09 NOTE — Progress Notes (Signed)
Name: Lori Gross   MRN: [id number]    DOB: 1996-03-03   Date:06/10/2021       Progress Note  Subjective  Chief Complaint  Palpitations x3 days  HPI  Palpitation: she has noticed fluttering on her heart over the past few days. She had stopped drinking sodas two weeks ago, but over the past week she resumed drinking mountain dew 4 cups daily. She states the reason she resumed drinking was because she developed headaches without the caffeine. She denies chest pain but feels some SOB with episodes. It resolves when she coughs. Episodes are brief and intermittently throughout the day. She states today only had a mild episode. 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   Microalbuminuria 10/08/2014   Obesity (BMI 30.0-34.9) 10/08/2014   Migraine without aura and responsive to treatment 10/08/2014    Past Surgical History:  Procedure Laterality Date   NO PAST SURGERIES      Family History  Problem Relation Age of Onset   Hypertension Mother    Cancer Mother 31       Common Bile Duct   Hypertension Sister    Hypertension Brother    Diabetes Paternal Grandmother    Cancer Paternal Grandmother 41       brain cancer   Diabetes Paternal Grandfather    Hypertension Father    Cancer Father 61       kidney cancer    Social History   Tobacco Use   Smoking status: Never   Smokeless tobacco: Never  Substance Use Topics   Alcohol use: No    Alcohol/week: 0.0 standard drinks     Current Outpatient Medications:    amLODipine (NORVASC) 10 MG tablet, Take 1 tablet (10 mg total) by mouth daily., Disp: 90 tablet, Rfl: 1   desogestrel-ethinyl estradiol (KARIVA) 0.15-0.02/0.01 MG (21/5) tablet, Take  1 tablet by mouth daily., Disp: 84 tablet, Rfl: 3   triamterene-hydrochlorothiazide (MAXZIDE-25) 37.5-25 MG tablet, Take 1 tablet by mouth daily., Disp: 90 tablet, Rfl: 1   Cyanocobalamin (B-12) 1000 MCG SUBL, Place 1 tablet under the tongue daily. (Patient not taking: Reported on 06/10/2021), Disp: 100 tablet, Rfl: 1   Vitamin D, Ergocalciferol, (DRISDOL) 1.25 MG (50000 UNIT) CAPS capsule, TAKE 1 CAPSULE (50,000 UNITS TOTAL) BY MOUTH EVERY 7 (SEVEN) DAYS (Patient not taking: Reported on 06/10/2021), Disp: 12 capsule, Rfl: 0  No Known Allergies  I personally reviewed active problem list, medication list, allergies, family history, social history, health maintenance with the patient/caregiver today.   ROS  Constitutional: Negative for fever or weight change.  Respiratory: Negative for cough and shortness of breath.   Cardiovascular: Negative for chest pain , positive for intermittent palpitations.  Gastrointestinal: Negative for abdominal pain, no bowel changes.  Musculoskeletal: Negative for gait problem or joint swelling.  Skin: Negative for rash.  Neurological: Negative for dizziness or headache.  No other specific complaints in a complete review of systems (except as listed in HPI above).   Objective  Vitals:   06/10/21 1059  BP: 130/78  Pulse: (!) 106  Resp: 16  Temp: 98.7 F (37.1 C)  TempSrc: Oral  SpO2: 100%  Weight: 222 lb 6.4 oz (100.9 kg)  Height: '5\' 4"'  (1.626  m)    Body mass index is 38.17 kg/m.  Physical Exam  Constitutional: Patient appears well-developed and well-nourished. Obese  No distress.  HEENT: head atraumatic, normocephalic, pupils equal and reactive to light,neck supple, throat within normal limits Cardiovascular: Normal rate, regular rhythm and normal heart sounds.  No murmur heard. No BLE edema. Pulmonary/Chest: Effort normal and breath sounds normal. No respiratory distress. Abdominal: Soft.  There is no tenderness. Psychiatric: Patient has a  normal mood and affect. behavior is normal. Judgment and thought content normal.   Recent Results (from the past 2160 hour(s))  COMPLETE METABOLIC PANEL WITH GFR     Status: None   Collection Time: 05/20/21  3:23 PM  Result Value Ref Range   Glucose, Bld 77 65 - 99 mg/dL    Comment: .            Fasting reference interval .    BUN 9 7 - 25 mg/dL   Creat 0.68 0.50 - 0.96 mg/dL   eGFR 122 > OR = 60 mL/min/1.26m    Comment: The eGFR is based on the CKD-EPI 2021 equation. To calculate  the new eGFR from a previous Creatinine or Cystatin C result, go to https://www.kidney.org/professionals/ kdoqi/gfr%5Fcalculator    BUN/Creatinine Ratio NOT APPLICABLE 6 - 22 (calc)   Sodium 141 135 - 146 mmol/L   Potassium 4.2 3.5 - 5.3 mmol/L   Chloride 103 98 - 110 mmol/L   CO2 27 20 - 32 mmol/L   Calcium 9.7 8.6 - 10.2 mg/dL   Total Protein 7.4 6.1 - 8.1 g/dL   Albumin 4.3 3.6 - 5.1 g/dL   Globulin 3.1 1.9 - 3.7 g/dL (calc)   AG Ratio 1.4 1.0 - 2.5 (calc)   Total Bilirubin 0.3 0.2 - 1.2 mg/dL   Alkaline phosphatase (APISO) 81 31 - 125 U/L   AST 16 10 - 30 U/L   ALT 11 6 - 29 U/L  TSH     Status: None   Collection Time: 05/20/21  3:23 PM  Result Value Ref Range   TSH 1.27 mIU/L    Comment:           Reference Range .           > or = 20 Years  0.40-4.50 .                Pregnancy Ranges           First trimester    0.26-2.66           Second trimester   0.55-2.73           Third trimester    0.43-2.91   Lipid panel     Status: Abnormal   Collection Time: 05/20/21  3:23 PM  Result Value Ref Range   Cholesterol 283 (H) <200 mg/dL   HDL 83 > OR = 50 mg/dL   Triglycerides 159 (H) <150 mg/dL   LDL Cholesterol (Calc) 169 (H) mg/dL (calc)    Comment: Reference range: <100 . Desirable range <100 mg/dL for primary prevention;   <70 mg/dL for patients with CHD or diabetic patients  with > or = 2 CHD risk factors. .Marland KitchenLDL-C is now calculated using the Martin-Hopkins  calculation, which is  a validated novel method providing  better accuracy than the Friedewald equation in the  estimation of LDL-C.  MCresenciano Genreet al. JAnnamaria Helling 21610;960(45: 2061-2068  (http://education.[employer].com/faq/FAQ164)    Total CHOL/HDL Ratio 3.4 <5.0 (calc)   Non-HDL Cholesterol (  Calc) 200 (H) <130 mg/dL (calc)    Comment: For patients with diabetes plus 1 major ASCVD risk  factor, treating to a non-HDL-C goal of <100 mg/dL  (LDL-C of <70 mg/dL) is considered a therapeutic  option.     PHQ2/9: Depression screen [facility] 2/9 06/10/2021 05/20/2021 01/06/2021 10/04/2020 04/22/2020  Decreased Interest 0 0 0 0 0  Down, Depressed, Hopeless 0 0 0 0 0  PHQ - 2 Score 0 0 0 0 0  Altered sleeping 0 0 - - -  Tired, decreased energy 0 0 - - -  Change in appetite 0 0 - - -  Feeling bad or failure about yourself  0 0 - - -  Trouble concentrating 0 0 - - -  Moving slowly or fidgety/restless 0 0 - - -  Suicidal thoughts 0 0 - - -  PHQ-9 Score 0 0 - - -  Difficult doing work/chores Not difficult at all Not difficult at all - - -  Some recent data might be hidden    phq 9 is negative   Fall Risk: Fall Risk  06/10/2021 05/20/2021 01/06/2021 10/04/2020 04/22/2020  Falls in the past year? 0 0 0 0 0  Number falls in past yr: 0 0 - 0 0  Injury with Fall? 0 0 - 0 0  Risk for fall due to : No Fall Risks No Fall Risks - - -  Follow up Falls prevention discussed Falls prevention discussed - Falls prevention discussed -      Functional Status Survey: Is the patient deaf or have difficulty hearing?: No Does the patient have difficulty seeing, even when wearing glasses/contacts?: No Does the patient have difficulty concentrating, remembering, or making decisions?: No Does the patient have difficulty walking or climbing stairs?: No Does the patient have difficulty dressing or bathing?: No Does the patient have difficulty doing errands alone such as visiting a doctor's office or shopping?: No    Assessment & Plan  1.  Palpitations  - EKG 12-Lead - Ambulatory referral to Cardiology  2. Hypertension, benign  - Ambulatory referral to Cardiology  3. Dyslipidemia  Discussed life style modification  4. Migraine without aura and responsive to treatment  We need to avoid triptans due to her history of HTN - Ubrogepant (UBRELVY) 100 MG TABS; Take 1 tablet by mouth daily as needed.  Dispense: 16 tablet; Refill: 1    She is a good candidate for bet blocker for migraine prevention and also palpitation and HTN , however we will hold off until she sees cardiologist so they can evaluate her.

## 2021-06-09 NOTE — Telephone Encounter (Signed)
Patient called in and stated that for the past 3 days she have been having fluttering in her chest and she have to cough for it to stop say that  she is on BP medication so she is concerned and want an EKG to rule out anything can be reached at Ph# [phone]    Chief Complaint: Palpitations Symptoms: Fluttering in chest Frequency: Started 3 days ago Pertinent Negatives: Patient denies chest oain Disposition: [] ED /[] Urgent Care (no appt availability in office) / [x] Appointment(In office/virtual)/ []   Virtual Care/ [] Home Care/ [] Refused Recommended Disposition /[]  Mobile Bus/ []  Follow-up with PCP Additional Notes: Instructed to go to ED for worsening of symptoms.   Reason for Disposition  [1] Palpitations AND [2] no improvement after using CARE ADVICE  Answer Assessment - Initial Assessment Questions 1. DESCRIPTION: "Please describe your heart rate or heartbeat that you are having" (e.g., fast/slow, regular/irregular, skipped or extra beats, "palpitations")     Flutter 2. ONSET: "When did it start?" (Minutes, hours or days)      3 days ago 3. DURATION: "How long does it last" (e.g., seconds, minutes, hours)     Seconds 4. PATTERN "Does it come and go, or has it been constant since it started?"  "Does it get worse with exertion?"   "Are you feeling it now?"     Comes and goes 5. TAP: "Using your hand, can you tap out what you are feeling on a chair or table in front of you, so that I can hear?" (Note: not all patients can do this)       No 6. HEART RATE: "Can you tell me your heart rate?" "How many beats in 15 seconds?"  (Note: not all patients can do this)       No 7. RECURRENT SYMPTOM: "Have you ever had this before?" If Yes, ask: "When was the last time?" and "What happened that time?"      No 8. CAUSE: "What do you think is causing the palpitations?"     Unsure 9. CARDIAC HISTORY: "Do you have any history of heart disease?" (e.g., heart attack, angina,  bypass surgery, angioplasty, arrhythmia)      No 10. OTHER SYMPTOMS: "Do you have any other symptoms?" (e.g., dizziness, chest pain, sweating, difficulty breathing)       No 11. PREGNANCY: "Is there any chance you are pregnant?" "When was your last menstrual period?"       No  Protocols used: Heart Rate and Heartbeat Questions-A-AH

## 2021-06-10 ENCOUNTER — Ambulatory Visit (INDEPENDENT_AMBULATORY_CARE_PROVIDER_SITE_OTHER): Payer: Medicaid Other | Admitting: Family Medicine

## 2021-06-10 ENCOUNTER — Encounter: Payer: Self-pay | Admitting: Family Medicine

## 2021-06-10 VITALS — BP 130/78 | HR 106 | Temp 98.7°F | Resp 16 | Ht 64.0 in | Wt 222.4 lb

## 2021-06-10 DIAGNOSIS — G43009 Migraine without aura, not intractable, without status migrainosus: Secondary | ICD-10-CM | POA: Diagnosis not present

## 2021-06-10 DIAGNOSIS — R002 Palpitations: Secondary | ICD-10-CM

## 2021-06-10 DIAGNOSIS — I10 Essential (primary) hypertension: Secondary | ICD-10-CM | POA: Diagnosis not present

## 2021-06-10 DIAGNOSIS — E785 Hyperlipidemia, unspecified: Secondary | ICD-10-CM | POA: Diagnosis not present

## 2021-06-10 MED ORDER — UBRELVY 100 MG PO TABS: 1.0000 | ORAL_TABLET | Freq: Every day | ORAL | 1 refills | Status: DC | PRN

## 2021-06-11 ENCOUNTER — Other Ambulatory Visit: Payer: Self-pay | Admitting: Obstetrics and Gynecology

## 2021-06-11 DIAGNOSIS — Z3041 Encounter for surveillance of contraceptive pills: Secondary | ICD-10-CM

## 2021-06-18 DIAGNOSIS — Z419 Encounter for procedure for purposes other than remedying health state, unspecified: Secondary | ICD-10-CM | POA: Diagnosis not present

## 2021-06-20 ENCOUNTER — Telehealth: Payer: Self-pay | Admitting: Family Medicine

## 2021-06-20 NOTE — Telephone Encounter (Signed)
Pt states the Ubrogepant (UBRELVY) 100 MG TABS ?Is not covered by her insurance.  Can you prescribe something else? ? ?[id number] [address], [place] - [address] ?

## 2021-06-23 ENCOUNTER — Telehealth: Payer: Self-pay

## 2021-06-23 ENCOUNTER — Other Ambulatory Visit: Payer: Self-pay

## 2021-06-23 DIAGNOSIS — G43009 Migraine without aura, not intractable, without status migrainosus: Secondary | ICD-10-CM

## 2021-06-23 MED ORDER — NURTEC 75 MG PO TBDP: 75.0000 mg | ORAL_TABLET | ORAL | 3 refills | Status: DC | PRN

## 2021-06-24 NOTE — Telephone Encounter (Signed)
PA completed, medication sent to pharmacy.  ?

## 2021-07-02 ENCOUNTER — Other Ambulatory Visit: Payer: Self-pay

## 2021-07-02 ENCOUNTER — Ambulatory Visit: Payer: Self-pay | Admitting: *Deleted

## 2021-07-02 ENCOUNTER — Encounter: Payer: Self-pay | Admitting: Obstetrics and Gynecology

## 2021-07-02 ENCOUNTER — Telehealth: Payer: Self-pay

## 2021-07-02 ENCOUNTER — Ambulatory Visit (INDEPENDENT_AMBULATORY_CARE_PROVIDER_SITE_OTHER): Payer: Medicaid Other | Admitting: Obstetrics and Gynecology

## 2021-07-02 ENCOUNTER — Other Ambulatory Visit (HOSPITAL_COMMUNITY)
Admission: RE | Admit: 2021-07-02 | Discharge: 2021-07-02 | Disposition: A | Discharge status: Home / Self Care | Payer: Medicaid Other | Source: Ambulatory Visit | Attending: Obstetrics and Gynecology | Admitting: Obstetrics and Gynecology

## 2021-07-02 VITALS — BP 136/92 | HR 109 | Ht 64.0 in | Wt 224.8 lb

## 2021-07-02 DIAGNOSIS — Z124 Encounter for screening for malignant neoplasm of cervix: Secondary | ICD-10-CM | POA: Diagnosis not present

## 2021-07-02 DIAGNOSIS — Z01419 Encounter for gynecological examination (general) (routine) without abnormal findings: Secondary | ICD-10-CM | POA: Insufficient documentation

## 2021-07-02 DIAGNOSIS — Z3041 Encounter for surveillance of contraceptive pills: Secondary | ICD-10-CM | POA: Diagnosis not present

## 2021-07-02 DIAGNOSIS — R87610 Atypical squamous cells of undetermined significance on cytologic smear of cervix (ASC-US): Secondary | ICD-10-CM | POA: Insufficient documentation

## 2021-07-02 DIAGNOSIS — Z0000 Encounter for general adult medical examination without abnormal findings: Secondary | ICD-10-CM | POA: Diagnosis not present

## 2021-07-02 MED ORDER — DESOGESTREL-ETHINYL ESTRADIOL 0.15-0.02/0.01 MG (21/5) PO TABS: 1.0000 | ORAL_TABLET | Freq: Every day | ORAL | 3 refills | Status: DC

## 2021-07-02 NOTE — Progress Notes (Signed)
? ? ?Primary Care Provider: Steele Sizer, MD ?[facility] Cardiologist: None ?Electrophysiologist: None ? ?Clinic Note: ?Chief Complaint  ?Patient presents with  ? NEW patient-Referred by PCP for evalof palpitations/HTN  ? ? ?=================================== ? ?ASSESSMENT/PLAN  ? ?Problem List Items Addressed This Visit   ? ?  ? Cardiology Problems  ? Hypertension, benign (Chronic)  ?  Longstanding hypertension since teenage years.  She is on amlodipine 10 mg along with Maxide. ? ?Low threshold to consider beta-blocker.  Her blood pressures are still pretty high today.  Would like to see what we are treating and see the effect. ? ?Plan: I will start Toprol-XL 25 mg daily after 1 week of wearing the monitor. ?  ?  ? Relevant Medications  ? metoprolol succinate (TOPROL XL) 25 MG 24 hr tablet  ?  ? Other  ? Obesity (BMI 30.0-34.9)  ?  Discussed dietary modifications and exercise. ? ?Weight loss should help with blood pressure, but also potentially tachycardia. ?  ?  ? Tachycardia - Primary  ?  Sinus tachycardia on exam here today.  She does have episodes of increased heart rates.  Unclear if it is just sinus tachycardia versus an arrhythmia. ? ?Plan: Zio patch monitor; start Toprol 25 mg daily (after 1 week of monitor); ?Encouraged continued adequate hydration, not just water but also needs to have some type of electrolyte replacement.  Intermittently use rehydration drinks or Ensure that she eats at least some TAVR snack along with water. ?  ?  ? Relevant Orders  ? EKG 12-Lead (Completed)  ? LONG TERM MONITOR (3-14 DAYS)  ? Dyslipidemia  ?  Lipids are pretty poorly controlled LDL 169. ->  Thankfully, she really does not have a family history of CAD. ? ?We can discuss dietary modifications and lifestyle adjustments.  However if still not controlled, may consider screenings Coronary Calcium Score for risk stratification. ?  ?  ? Systolic murmur  ?  Soft SEM, likely flow murmur, however with palpitations and  loss and hypertension need to exclude some other type of structural abnormality. ?Also need to just determine presence of potential hypertensive heart disease ?  ?  ? Relevant Orders  ? ECHOCARDIOGRAM COMPLETE  ? ? ?=================================== ? ?HPI:   ? ?Lori Gross is a 26 y.o. female with history of hypertension (on Maxide and amlodipine, and on OCPs) and hyperlipidemia (untreated) who is being seen today for the evaluation of TACHYCARDIA at the request of Steele Sizer, MD. ? ?Lori Gross was recently seen by Dr. Ancil Boozer on June 10, 2021 with complaints of palpitations.  She noted heart fluttering for several days.  Apparently she had stopping sodas a couple weeks ago but then resumed drinking [facility] 4 cups daily.  This was because of likely caffeine withdrawal having headaches.  No chest pain or dyspnea associated with episodes.  Resolved with cough.  Brief, intermittent.  Heart rate on evaluation was 106. => Normal TSH normal electrolytes. ?=> Referred to cardiology.  Beta-blocker thought to be a good option with her migraine headaches. ? ?She was also just seen on March 15 for routine well-woman gynecological examination (G2 P2 002) ? ?Recent Hospitalizations: none ? ?Reviewed  CV studies:   ? ?The following studies were reviewed today: (if available, images/films reviewed: From Epic Chart or Care Everywhere) ?none: ? ?Interval History:  ? ?Lori Gross presents here today to discuss fast heart rate spells.  She says that she has actually pretty much cut out  caffeine.She is also trying to drink lots of water at least 5 bottles a day.  This is probably because of the palpitations, but also in an effort to lose weight.  She does feel better having started losing weight and notes that her palpitation episodes are not as frequent. ? ?She is to donate plasma and when she did that she would note some palpitations and heart skipping.  But now no longer noting fluttering ? ?She  says she usually feels the heart rate spells going up when she is at work.  She says at baseline she has a pretty high resting heart rate somewhere in the high 80s to low 90s.  He can get up in the 120s with minimal activity. ? ?She says they are relatively short episodes of fast heart rates and not necessarily irregular.  She feels a little bit of uneasiness and midodrine short of breath when the heart rate goes fast, but does not note any chest pain or syncope/near syncope. ? ?CV Review of Symptoms (Summary) ?Cardiovascular ROS: no chest pain or dyspnea on exertion ?positive for - palpitations, rapid heart rate, and mild associated dizziness ?negative for - edema, irregular heartbeat, orthopnea, palpitations, shortness of breath, or syncope or near syncope, TIA/amaurosis fugax or claudication ? ?REVIEWED OF SYSTEMS  ? ?Review of Systems  ?Constitutional:  Positive for weight loss (None recently, but has been losing some weight.). Negative for malaise/fatigue.  ?HENT:  Negative for congestion.   ?Respiratory: Negative.    ?Cardiovascular:   ?     Per HPI  ?Gastrointestinal:  Negative for blood in stool and melena.  ?Genitourinary:  Negative for dysuria and hematuria.  ?Musculoskeletal:  Negative for falls, joint pain and myalgias.  ?Neurological:  Negative for dizziness and focal weakness.  ?Psychiatric/Behavioral: Negative.    ? ?I have reviewed and (if needed) personally updated the patient's problem list, medications, allergies, past medical and surgical history, social and family history.  ? ?PAST MEDICAL HISTORY  ? ?Past Medical History:  ?Diagnosis Date  ? Benign headache   ? Dysmenorrhea   ? Grieving   ? Hyperlipidemia due to dietary fat intake   ? Hypertension   ? Marijuana use 04/20/2014  ? positive UDS  ? Microalbuminuria   ? Migraine without aura and without status migrainosus, not intractable   ? Morbid obesity due to excess calories ([place])   ? BMI 37-38 with HTN & HLD  ? Obesity   ? Urticaria    ? ? ?PAST SURGICAL HISTORY  ? ?Past Surgical History:  ?Procedure Laterality Date  ? NO PAST SURGERIES    ? ? ?Immunization History  ?Administered Date(s) Administered  ? DTaP 03/26/1994, 05/28/1994, 07/27/1994, 04/27/1995, 11/10/1999  ? HPV Quadrivalent 01/22/2012, 06/28/2012, 12/27/2012  ? Hepatitis A 01/22/2012, 12/27/2012  ? Hepatitis B November [birth year], 1993, 03/26/1994, 07/27/1994  ? HiB (PRP-OMP) 03/26/1994, 05/28/1994, 07/27/1994, 04/27/1995  ? IPV 03/26/1994, 05/28/1994, 07/27/1994, 11/10/1999  ? Influenza,inj,Quad PF,6+ Mos 04/23/2015, 07/10/2016, 07/15/2018, 01/16/2019, 04/22/2020  ? Influenza-Unspecified 06/07/2014  ? MMR 04/27/1995, 11/10/1999, 11/10/2015  ? Tdap 01/22/2012, 08/27/2015, 12/22/2016  ? Varicella 02/09/1995, 01/22/2012, 11/10/2015  ? ? ?MEDICATIONS/ALLERGIES  ? ?Current Meds  ?Medication Sig  ? amLODipine (NORVASC) 10 MG tablet Take 1 tablet (10 mg total) by mouth daily.  ? Cyanocobalamin (B-12) 1000 MCG SUBL Place 1 tablet under the tongue daily.  ? desogestrel-ethinyl estradiol (KARIVA) 0.15-0.02/0.01 MG (21/5) tablet Take 1 tablet by mouth daily.  ? metoprolol succinate (TOPROL XL) 25 MG 24 hr tablet Take 1  tablet (25 mg total) by mouth daily.  ? Rimegepant Sulfate (NURTEC) 75 MG TBDP Take 75 mg by mouth as needed (One tablet (52m) by mouth daily as needed).  ? triamterene-hydrochlorothiazide (MAXZIDE-25) 37.5-25 MG tablet Take 1 tablet by mouth daily.  ? Vitamin D, Ergocalciferol, (DRISDOL) 1.25 MG (50000 UNIT) CAPS capsule TAKE 1 CAPSULE (50,000 UNITS TOTAL) BY MOUTH EVERY 7 (SEVEN) DAYS  ? ? ?No Known Allergies ? ?SOCIAL HISTORY/FAMILY HISTORY  ? ?Reviewed in Epic:  ? ?Social History  ? ?Tobacco Use  ? Smoking status: Never  ? Smokeless tobacco: Never  ?Vaping Use  ? Vaping Use: Never used  ?Substance Use Topics  ? Alcohol use: No  ?  Alcohol/week: 0.0 standard drinks  ? Drug use: No  ? ?Social History  ? ?Social History Narrative  ? Dating for 5 years (Rolan Bucco) they have  a baby girl -  CPrimitivo Gauzeon July 21st, 2017 and one boy named Julius-they are  living together  ? Working full time at [employer] full time.   ? ?Family History  ?Problem Relation Age of Onset  ? Hypertensio

## 2021-07-02 NOTE — Telephone Encounter (Signed)
Second attempt to contact patient- left message to call office 

## 2021-07-02 NOTE — Telephone Encounter (Signed)
Copied from CRM [id number]. Topic: General - Other ?>> Jul 02, 2021 11:40 AM Gaetana Michaelis A wrote: ?Reason for CRM: The patient would like to speak with a member of staff when possible about their referral for cardiology  ? ?The patient has not heard from the Cardiologist's office that they've been referred to and they would like to discuss this concern further ? ?Please contact when available ?

## 2021-07-02 NOTE — Progress Notes (Signed)
Patients presents for annual exam today. Patient states she would like to discuss birth control options. Patient is due for pap smear, ordered. Patient states no other questions or concerns at this time.   ?

## 2021-07-02 NOTE — Telephone Encounter (Signed)
3rd attempt: Patient called, left VM to return the call to the office to discuss symptoms with a nurse. ? ?Summary: headaches / rx req  ?  The patient has experienced significant headaches for roughly three years  ? ?The patient has began to decrease their caffeine intake and noticed an increased amount of discomfort and headaches  ? ?The patient's PCP previously attempted to prescribe them a medication to help with their discomfort but it was not covered by insurance  ? ?The patient would like to be prescribed a medication to help with their headaches that are persisting  ? ?Please contact further   ?  ? ?

## 2021-07-02 NOTE — Addendum Note (Signed)
Addended by: Georgiana Shore R on: 07/02/2021 11:38 AM ? ? Modules accepted: Orders ? ?

## 2021-07-02 NOTE — Telephone Encounter (Signed)
Summary: headaches / rx req  ? The patient has experienced significant headaches for roughly three years  ? ?The patient has began to decrease their caffeine intake and noticed an increased amount of discomfort and headaches  ? ?The patient's PCP previously attempted to prescribe them a medication to help with their discomfort but it was not covered by insurance  ? ?The patient would like to be prescribed a medication to help with their headaches that are persisting  ? ?Please contact further   ?  ? ?Attempted to call patient- left message to call office ?

## 2021-07-02 NOTE — Progress Notes (Signed)
HPI: ?     [name] is a 26 y.o. [id number] who LMP was Patient's last menstrual period was 06/21/2021. ? ?Subjective:  ? ?She presents today for her annual examination.  She states that her other doctor told her that maybe her blood pressure was being affected by OCPs and she wanted to ask about that.  She is on Maxide at this time for blood pressure issues.  She currently likes her birth control pills and does not want to change unless she has to. ?She has had a history of abnormal Pap smear and previously underwent colposcopy.  She is due for a Pap today. ? ?  Hx: ?The following portions of the patient's history were reviewed and updated as appropriate: ?            She  has a past medical history of Benign headache, Dysmenorrhea, Grieving, Hypertension, Marijuana use (2016), Microalbuminuria, Migraine without aura and without status migrainosus, not intractable, Obesity, and Urticaria. ?She does not have any pertinent problems on file. ?She  has a past surgical history that includes No past surgeries. ?Her family history includes Cancer (age of onset: 72) in her mother; Cancer (age of onset: 104) in her father; Cancer (age of onset: 75) in her paternal grandmother; Diabetes in her paternal grandfather and paternal grandmother; Hypertension in her brother, father, mother, and sister. ?She  reports that she has never smoked. She has never used smokeless tobacco. She reports that she does not drink alcohol and does not use drugs. ?She has a current medication list which includes the following prescription(s): amlodipine, b-12, nurtec, triamterene-hydrochlorothiazide, vitamin d (ergocalciferol), and desogestrel-ethinyl estradiol. ?She has No Known Allergies. ?      ?Review of Systems:  ?Review of Systems ? ?Constitutional: Denied constitutional symptoms, night sweats, recent illness, fatigue, fever, insomnia and weight loss.  ?Eyes: Denied eye symptoms, eye pain, photophobia, vision change and visual  disturbance.  ?Ears/Nose/Throat/Neck: Denied ear, nose, throat or neck symptoms, hearing loss, nasal discharge, sinus congestion and sore throat.  ?Cardiovascular: Denied cardiovascular symptoms, arrhythmia, chest pain/pressure, edema, exercise intolerance, orthopnea and palpitations.  ?Respiratory: Denied pulmonary symptoms, asthma, pleuritic pain, productive sputum, cough, dyspnea and wheezing.  ?Gastrointestinal: Denied, gastro-esophageal reflux, melena, nausea and vomiting.  ?Genitourinary: Denied genitourinary symptoms including symptomatic vaginal discharge, pelvic relaxation issues, and urinary complaints.  ?Musculoskeletal: Denied musculoskeletal symptoms, stiffness, swelling, muscle weakness and myalgia.  ?Dermatologic: Denied dermatology symptoms, rash and scar.  ?Neurologic: Denied neurology symptoms, dizziness, headache, neck pain and syncope.  ?Psychiatric: Denied psychiatric symptoms, anxiety and depression.  ?Endocrine: Denied endocrine symptoms including hot flashes and night sweats.  ? ?Meds: ?  ?Current Outpatient Medications on File Prior to Visit  ?Medication Sig Dispense Refill  ? amLODipine (NORVASC) 10 MG tablet Take 1 tablet (10 mg total) by mouth daily. 90 tablet 1  ? Cyanocobalamin (B-12) 1000 MCG SUBL Place 1 tablet under the tongue daily. 100 tablet 1  ? Rimegepant Sulfate (NURTEC) 75 MG TBDP Take 75 mg by mouth as needed (One tablet (75mg ) by mouth daily as needed). 8 tablet 3  ? triamterene-hydrochlorothiazide (MAXZIDE-25) 37.5-25 MG tablet Take 1 tablet by mouth daily. 90 tablet 1  ? Vitamin D, Ergocalciferol, (DRISDOL) 1.25 MG (50000 UNIT) CAPS capsule TAKE 1 CAPSULE (50,000 UNITS TOTAL) BY MOUTH EVERY 7 (SEVEN) DAYS 12 capsule 0  ? ?No current facility-administered medications on file prior to visit.  ? ? ? ? ?Objective:  ?  ? ?Vitals:  ? 07/02/21 1108  ?  BP: (!) 136/92  ?Pulse: (!) 109  ?  ?Filed Weights  ? 07/02/21 1108  ?Weight: 224 lb 12.8 oz (102 kg)  ? ?  ?         Physical  examination ?General NAD, Conversant  ?HEENT Atraumatic; Op clear with mmm.  Normo-cephalic. Pupils reactive. Anicteric sclerae  ?Thyroid/Neck Smooth without nodularity or enlargement. Normal ROM.  Neck Supple.  ?Skin No rashes, lesions or ulceration. Normal palpated skin turgor. No nodularity.  ?Breasts: No masses or discharge.  Symmetric.  No axillary adenopathy.  ?Lungs: Clear to auscultation.No rales or wheezes. Normal Respiratory effort, no retractions.  ?Heart: NSR.  No murmurs or rubs appreciated. No periferal edema  ?Abdomen: Soft.  Non-tender.  No masses.  No HSM. No hernia  ?Extremities: Moves all appropriately.  Normal ROM for age. No lymphadenopathy.  ?Neuro: Oriented to PPT.  Normal mood. Normal affect.  ? ?  Pelvic:   ?Vulva: Normal appearance.  No lesions.  ?Vagina: No lesions or abnormalities noted.  ?Support: Normal pelvic support.  ?Urethra No masses tenderness or scarring.  ?Meatus Normal size without lesions or prolapse.  ?Cervix: Normal appearance.  No lesions.  ?Anus: Normal exam.  No lesions.  ?Perineum: Normal exam.  No lesions.  ?      Bimanual   ?Uterus: Normal size.  Non-tender.  Mobile.  AV.  ?Adnexae: No masses.  Non-tender to palpation.  ?Cul-de-sac: Negative for abnormality.  ? ? ? ?Assessment:  ?  ?G2P2002 ?Patient Active Problem List  ? Diagnosis Date Noted  ? Wrist pain, left 05/20/2021  ? Tachycardia 07/15/2018  ? Dyslipidemia 07/15/2018  ? Hypertension, benign 11/20/2015  ? Dysmenorrhea 10/08/2014  ? 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 Microalbuminuria 10/08/2014  ? Obesity (BMI 30.0-34.9) 10/08/2014  ? Migraine without aura and responsive to treatment 10/08/2014  ? ?  ?1. Well woman exam with routine gynecological exam   ?2. Cervical cancer screening   ?3. Encounter for surveillance of contraceptive pills   ? ?  ? ? ?Plan:  ?  ?       ? 1.  Basic Screening Recommendations ?The basic screening recommendations for asymptomatic women were discussed with the patient during her  visit.  The age-appropriate recommendations were discussed with her and the rational for the tests reviewed.  When I am informed by the patient that another primary care physician has previously obtained the age-appropriate tests and they are up-to-date, only outstanding tests are ordered and referrals given as necessary.  Abnormal results of tests will be discussed with her when all of her results are completed.  Routine preventative health maintenance measures emphasized: Exercise/Diet/Weight control, Tobacco Warnings, Alcohol/Substance use risks and Stress Management ?2.  We have discussed OCPs and their effect on blood pressure in detail.  I have offered to change her pills to a different type.  We have briefly discussed other forms of birth control that did not affect her blood pressure-for example IUD.  Patient current desires to stay on OCPs and adjust her blood pressure medication rather than stopping her current OCPs or changing them. ?She says she will speak with her other doctor about her blood pressure and her current medication regimen. ?3.  Pap Co. test ?Orders ?No orders of the defined types were placed in this encounter. ? ?  ?Meds ordered this encounter  ?Medications  ? desogestrel-ethinyl estradiol (KARIVA) 0.15-0.02/0.01 MG (21/5) tablet  ?  Sig: Take 1 tablet by mouth daily.  ?  Dispense:  84 tablet  ?  Refill:  3  ?   ? ?  ?  F/U ? Return in about 1 year (around 07/03/2022) for Annual Physical. ? ?Elonda Huskyavid J. Vinicius Brockman, M.D. ?07/02/2021 ?11:35 AM ? ? ? ?

## 2021-07-03 ENCOUNTER — Ambulatory Visit: Payer: Medicaid Other | Admitting: Cardiology

## 2021-07-03 ENCOUNTER — Ambulatory Visit (INDEPENDENT_AMBULATORY_CARE_PROVIDER_SITE_OTHER): Payer: Medicaid Other | Admitting: Cardiology

## 2021-07-03 ENCOUNTER — Encounter: Payer: Self-pay | Admitting: Cardiology

## 2021-07-03 ENCOUNTER — Ambulatory Visit (INDEPENDENT_AMBULATORY_CARE_PROVIDER_SITE_OTHER): Payer: Medicaid Other

## 2021-07-03 VITALS — BP 150/86 | HR 115 | Ht 65.0 in | Wt 225.0 lb

## 2021-07-03 DIAGNOSIS — E669 Obesity, unspecified: Secondary | ICD-10-CM | POA: Diagnosis not present

## 2021-07-03 DIAGNOSIS — I10 Essential (primary) hypertension: Secondary | ICD-10-CM

## 2021-07-03 DIAGNOSIS — E785 Hyperlipidemia, unspecified: Secondary | ICD-10-CM | POA: Diagnosis not present

## 2021-07-03 DIAGNOSIS — R011 Cardiac murmur, unspecified: Secondary | ICD-10-CM | POA: Insufficient documentation

## 2021-07-03 DIAGNOSIS — R000 Tachycardia, unspecified: Secondary | ICD-10-CM

## 2021-07-03 MED ORDER — METOPROLOL SUCCINATE ER 25 MG PO TB24: 25.0000 mg | ORAL_TABLET | Freq: Every day | ORAL | 3 refills | Status: DC

## 2021-07-03 NOTE — Patient Instructions (Addendum)
Medication Instructions:  ?Your physician has recommended you make the following change in your medication:  ? ?START taking metoprolol succinate (Toprol XL) 25 mg daily after you've worn your monitor for 1 week  ? ?*If you need a refill on your cardiac medications before your next appointment, please call your pharmacy* ? ? ?Lab Work: ?None ordered ? ?If you have labs (blood work) drawn today and your tests are completely normal, you will receive your results only by: ?MyChart Message (if you have MyChart) OR ?A paper copy in the mail ?If you have any lab test that is abnormal or we need to change your treatment, we will call you to review the results. ? ? ?Testing/Procedures: ? ?Your physician has requested that you have an echocardiogram. Echocardiography is a painless test that uses sound waves to create images of your heart. It provides your doctor with information about the size and shape of your heart and how well your heart?s chambers and valves are working. This procedure takes approximately one hour. There are no restrictions for this procedure.  ? ?Your provider has ordered a heart monitor to wear for 14 days. This will be mailed to your home with instructions on placement. Once you have finished the time frame requested, you will return monitor in box provided. ? ?  ? ?Follow-Up: ?At [facility], you and your health needs are our priority.  As part of our continuing mission to provide you with exceptional heart care, we have created designated Provider Care Teams.  These Care Teams include your primary Cardiologist (physician) and Advanced Practice Providers (APPs -  Physician Assistants and Nurse Practitioners) who all work together to provide you with the care you need, when you need it. ? ?We recommend signing up for the patient portal called "MyChart".  Sign up information is provided on this After Visit Summary.  MyChart is used to connect with patients for Virtual Visits (Telemedicine).   Patients are able to view lab/test results, encounter notes, upcoming appointments, etc.  Non-urgent messages can be sent to your provider as well.   ?To learn more about what you can do with MyChart, go to [email or web address].   ? ?Your next appointment:   ?6-8 week(s) ? ?The format for your next appointment:   ?In Person ? ?Provider:   ?You may see Bryan Lemma, MD or one of the following Advanced Practice Providers on your designated Care Team:   ?Nicolasa Ducking, NP ?Eula Listen, PA-C ?Cadence Fransico Michael, PA-C ? ? ?Other Instructions ?N/A ?

## 2021-07-04 LAB — CYTOLOGY - PAP
Comment: NEGATIVE
Diagnosis: UNDETERMINED — AB
High risk HPV: NEGATIVE

## 2021-07-05 ENCOUNTER — Encounter: Payer: Self-pay | Admitting: Cardiology

## 2021-07-05 NOTE — Assessment & Plan Note (Signed)
Lipids are pretty poorly controlled LDL 169. ->  Thankfully, she really does not have a family history of CAD. ? ?We can discuss dietary modifications and lifestyle adjustments.  However if still not controlled, may consider screenings Coronary Calcium Score for risk stratification. ?

## 2021-07-05 NOTE — Assessment & Plan Note (Signed)
Soft SEM, likely flow murmur, however with palpitations and loss and hypertension need to exclude some other type of structural abnormality. ?Also need to just determine presence of potential hypertensive heart disease ?

## 2021-07-05 NOTE — Assessment & Plan Note (Signed)
Discussed dietary modifications and exercise. ? ?Weight loss should help with blood pressure, but also potentially tachycardia. ?

## 2021-07-05 NOTE — Assessment & Plan Note (Addendum)
Sinus tachycardia on exam here today.  She does have episodes of increased heart rates.  Unclear if it is just sinus tachycardia versus an arrhythmia. ? ?Plan: Zio patch monitor; start Toprol 25 mg daily (after 1 week of monitor); ?Encouraged continued adequate hydration, not just water but also needs to have some type of electrolyte replacement.  Intermittently use rehydration drinks or Ensure that she eats at least some TAVR snack along with water. ?

## 2021-07-05 NOTE — Assessment & Plan Note (Signed)
Longstanding hypertension since teenage years.  She is on amlodipine 10 mg along with Maxide. ? ?Low threshold to consider beta-blocker.  Her blood pressures are still pretty high today.  Would like to see what we are treating and see the effect. ? ?Plan: I will start Toprol-XL 25 mg daily after 1 week of wearing the monitor. ?

## 2021-07-17 ENCOUNTER — Telehealth: Payer: Self-pay | Admitting: Cardiology

## 2021-07-17 NOTE — Telephone Encounter (Signed)
Patient calling ?States she has yet to receive her ZIO monitor  ?Please call to discuss  ?

## 2021-07-18 NOTE — Telephone Encounter (Signed)
I called and spoke with a rep at [facility]. ?I advised the patient called yestereday afternoon stating she had not yet received her ZIO monitor that was ordered on 07/03/21 by Dr. Herbie Baltimore.  ? ?Per [employer], the patient's monitor was shipped and then returned on 07/16/21 unopened and unactivated.  ?I have confirmed that the address they have on file is the same as what we have. ?Rep advised they will go ahead and send another monitor to the patient at no charge.  ?She should receive this in 3-5 business days.  ? ?I have called and notified the patient of the above information. ?I have re-confirmed with her that her mailing address is correct on file. ?I have also asked that per [employer], if she does not receive her monitor in 3-5 business days, she will need to call them directly at [phone]. ? ?I have asked the patient to reach out to [place] when she places her monitor to make sure we do not need to r/s her follow up appointment with Dr. Herbie Baltimore. ? ?The patient voices understanding of all of the above and is agreeable. ? ? ? ? ?

## 2021-07-19 DIAGNOSIS — Z419 Encounter for procedure for purposes other than remedying health state, unspecified: Secondary | ICD-10-CM | POA: Diagnosis not present

## 2021-07-30 HISTORY — PX: TRANSTHORACIC ECHOCARDIOGRAM: SHX275

## 2021-07-31 ENCOUNTER — Ambulatory Visit (INDEPENDENT_AMBULATORY_CARE_PROVIDER_SITE_OTHER): Payer: Medicaid Other

## 2021-07-31 DIAGNOSIS — R011 Cardiac murmur, unspecified: Secondary | ICD-10-CM

## 2021-07-31 LAB — ECHOCARDIOGRAM COMPLETE
AR max vel: 3.35 cm2
AV Area VTI: 3.21 cm2
AV Area mean vel: 3.12 cm2
AV Mean grad: 4 mmHg
AV Peak grad: 8.3 mmHg
Ao pk vel: 1.44 m/s
Area-P 1/2: 3.99 cm2
Calc EF: 52.1 %
S' Lateral: 3.3 cm
Single Plane A2C EF: 56.1 %
Single Plane A4C EF: 49.6 %

## 2021-08-02 DIAGNOSIS — R000 Tachycardia, unspecified: Secondary | ICD-10-CM

## 2021-08-04 ENCOUNTER — Ambulatory Visit: Payer: Medicaid Other | Admitting: Cardiovascular Disease

## 2021-08-05 ENCOUNTER — Telehealth: Payer: Self-pay | Admitting: Cardiology

## 2021-08-05 ENCOUNTER — Encounter: Payer: Self-pay | Admitting: Cardiology

## 2021-08-05 NOTE — Telephone Encounter (Signed)
Attempted to call the patient. °No answer- I left a detailed message of results on her voice mail (ok per DPR). °  °I asked that she call back with any further questions/ concerns.  °

## 2021-08-05 NOTE — Telephone Encounter (Signed)
Leonie Man, MD  ?08/02/2021  2:01 PM EDT   ?  ?Echocardiogram result: ?Normal pulm function with ejection fraction 55 to 60%.  Normal range is 50 to 70%.  Grade 1 diastolic function -> probably inaccurately read. ?Normal valves ?Fast heart rate. ?  ?Pretty normal echocardiogram.  No structural abnormalities. ?  ?Glenetta Hew, MD  ? ?

## 2021-08-14 ENCOUNTER — Ambulatory Visit: Payer: Medicaid Other | Admitting: Cardiology

## 2021-08-18 DIAGNOSIS — Z419 Encounter for procedure for purposes other than remedying health state, unspecified: Secondary | ICD-10-CM | POA: Diagnosis not present

## 2021-08-19 ENCOUNTER — Ambulatory Visit: Payer: Medicaid Other | Admitting: Family Medicine

## 2021-09-04 ENCOUNTER — Encounter: Payer: Self-pay | Admitting: Cardiology

## 2021-09-04 ENCOUNTER — Ambulatory Visit (INDEPENDENT_AMBULATORY_CARE_PROVIDER_SITE_OTHER): Payer: Medicaid Other | Admitting: Cardiology

## 2021-09-04 VITALS — BP 130/100 | HR 83 | Ht 64.0 in | Wt 224.0 lb

## 2021-09-04 DIAGNOSIS — I10 Essential (primary) hypertension: Secondary | ICD-10-CM | POA: Diagnosis not present

## 2021-09-04 DIAGNOSIS — R011 Cardiac murmur, unspecified: Secondary | ICD-10-CM | POA: Diagnosis not present

## 2021-09-04 DIAGNOSIS — R000 Tachycardia, unspecified: Secondary | ICD-10-CM

## 2021-09-04 HISTORY — DX: Morbid (severe) obesity due to excess calories: E66.01

## 2021-09-04 MED ORDER — METOPROLOL SUCCINATE ER 50 MG PO TB24: 50.0000 mg | ORAL_TABLET | Freq: Every day | ORAL | 3 refills | Status: DC

## 2021-09-04 NOTE — Progress Notes (Signed)
Primary Care Provider: Steele Sizer, MD [facility] Cardiologist: None Electrophysiologist: None  Clinic Note: Chief Complaint  Patient presents with   6-8 week follow up     Discuss Echo &  Zio monitor results. "Doing well." Patient hasn't been taking the amlodipine as she forgets due to taking in the pm. Medications reviewed by the patient verbally.    Palpitations    Notably improved.  Feeling much better.  ===================================  ASSESSMENT/PLAN   Problem List Items Addressed This Visit       Cardiology Problems   Hypertension, benign - Primary (Chronic)    Borderline pressures today but the elevated diastolic pressures.  Ultimately the best option here would be a diuretic, but with her palpitations I am leery of doing that.  She is also not taking her amlodipine regularly.  Plan: DC amlodipine and increase Toprol to 50 mg daily.      Relevant Medications   metoprolol succinate (TOPROL-XL) 50 MG 24 hr tablet   Other Relevant Orders   EKG 12-Lead (Completed)     Other   Tachycardia    Tachycardia notably improved with Toprol.  Monitor relatively reassuring.  Only sinus tachycardia.  No arrhythmias.  Resting heart rate seems to have improved since starting beta-blocker.  Since she is not taking amlodipine regularly, we will simply titrate up Toprol to 50 mg daily.      Relevant Orders   EKG 12-Lead (Completed)   Systolic murmur    As I thought, this is probably related to a flow murmur.  Less prominent today than it was before.  Valves look normal on echo.      Relevant Orders   EKG 12-Lead (Completed)   Morbid obesity (HCC) (Chronic)    Encouraged ongoing weight loss.  We will increase hydration to try to avoid palpitations, this should hopefully help reduce how much she eats.  Continue to recommend exercise as this will also help tachycardia.  Need to watch out for signs of chronotropic incompetence.       ===================================  HPI:    Lori Gross is a 26 y.o. female with history of hypertension (on Maxide and amlodipine, and on OCPs) and hyperlipidemia (untreated) who is being seen today for the evaluation of TACHYCARDIA at the request of Steele Sizer, MD.  Elliot Gault was recently seen by Dr. Ancil Boozer on June 10, 2021 with complaints of palpitations.  She noted heart fluttering for several days.  Apparently she had stopping sodas a couple weeks ago but then resumed drinking [facility] 4 cups daily.  This was because of likely caffeine withdrawal having headaches.  No chest pain or dyspnea associated with episodes.  Resolved with cough.  Brief, intermittent.  Heart rate on evaluation was 106. => Normal TSH normal electrolytes. => Referred to cardiology.  Beta-blocker thought to be a good option with her migraine headaches.  She was seen for initial consultation on 07/03/2021 at the request of Dr. Ancil Boozer for evaluation of palpitations.  She is seeing Dr. Ancil Boozer in February with complaints palpitations/heart fluttering.  She had just gone back to drinking [facility] because she was having caffeine withdrawal headaches.  She denies any chest pain or pressure.  Having issues with migraines. During initial consultation: She noted having cut out caffeine altogether and trying to drink at least 5 bottles of water a day as much for try and lose weight S2 treat palpitations.  Was feeling better and palpitations less frequent.  She often  would note palpitations and heart skipping when donating plasma but this was also improved.    She often noted her heart rate going up at work and baseline relatively high in the high 80s to low 90s but can go up to the 120s with minimal exertion.  Heart rate does not seem to be irregular.  Seems relatively regular.  Has a little uneasiness and shortness of breath with very fast heart rates but no syncope or near syncope. Zio Patch & Echo  ordered (murmur)  Started Toprol 25 mg daily; - continue aggressive hydration but also need to ensure that she has some type of snack/food to ensure absorption..   Recent Hospitalizations: none  Reviewed  CV studies:    The following studies were reviewed today: (if available, images/films reviewed: From Epic Chart or Care Everywhere) TTE 07/30/2021: HR 100 bpm. Normal LV size & fxn. EF 55-60%. No RWMA. Gr 1 DD? But normal LA size. Normal Valves. Normal RV, RVSP & RAP.   Monitor April 2023: SR -predominant sinus rhythm with a rate range of 54 to my 51 bpm.  Average 90 bpm.  Rare isolated PACs and PVCs.  Rates notably lower week 2 while on Beta Blocker.(Toprol 25 mg)   Interval History:   RAAGA MAEDER presents here for follow-up to discuss results of her studies.  Says she is feeling a lot better since taking Toprol. She but she does note is that she has not been taking her amlodipine as much consistent night.  She says that her resting heart rate is notably better as noted on the monitor.  She really is not noticing the fluttering sensations and the fast heart rate spells have also significant proved.  She does not notice any fatigue associated with being on beta-blocker.  CV Review of Symptoms (Summary) Cardiovascular ROS: no chest pain or dyspnea on exertion positive for - palpitations and even these are notably less frequent. negative for - edema, irregular heartbeat, orthopnea, palpitations, rapid heart rate, shortness of breath, or syncope or near syncope, TIA/amaurosis fugax or claudication  REVIEWED OF SYSTEMS   Review of Systems  Constitutional:  Negative for malaise/fatigue and weight loss (None recently, but has been losing some weight.).  HENT:  Negative for congestion.   Respiratory: Negative.    Cardiovascular:        Per HPI  Gastrointestinal:  Negative for blood in stool and melena.  Genitourinary:  Negative for dysuria and hematuria.  Musculoskeletal:  Negative  for falls, joint pain and myalgias.  Neurological:  Negative for dizziness and focal weakness.  Psychiatric/Behavioral: Negative.    All other systems reviewed and are negative.   I have reviewed and (if needed) personally updated the patient's problem list, medications, allergies, past medical and surgical history, social and family history.   PAST MEDICAL HISTORY   Past Medical History:  Diagnosis Date   Benign headache    Dysmenorrhea    Grieving    Hyperlipidemia due to dietary fat intake    Hypertension    Marijuana use 04/20/2014   positive UDS   Microalbuminuria    Migraine without aura and without status migrainosus, not intractable    Morbid obesity due to excess calories (HCC)    BMI 37-38 with HTN & HLD   Obesity    Urticaria     PAST SURGICAL HISTORY   Past Surgical History:  Procedure Laterality Date   NO PAST SURGERIES     TRANSTHORACIC ECHOCARDIOGRAM  07/30/2021  HR 100 bpm. Normal LV size & fxn. EF 55-60%. No RWMA. Gr 1 DD? But normal LA size. Normal Valves. Normal RV, RVSP & RAP.    Immunization History  Administered Date(s) Administered   DTaP 03/26/1994, 05/28/1994, 07/27/1994, 04/27/1995, 11/10/1999   HPV Quadrivalent 01/22/2012, 06/28/2012, 12/27/2012   Hepatitis A 01/22/2012, 12/27/2012   Hepatitis B Nov [birth year], 1993, 03/26/1994, 07/27/1994   HiB (PRP-OMP) 03/26/1994, 05/28/1994, 07/27/1994, 04/27/1995   IPV 03/26/1994, 05/28/1994, 07/27/1994, 11/10/1999   Influenza,inj,Quad PF,6+ Mos 04/23/2015, 07/10/2016, 07/15/2018, 01/16/2019, 04/22/2020   Influenza-Unspecified 06/07/2014   MMR 04/27/1995, 11/10/1999, 11/10/2015   Tdap 01/22/2012, 08/27/2015, 12/22/2016   Varicella 02/09/1995, 01/22/2012, 11/10/2015    MEDICATIONS/ALLERGIES   Current Meds  Medication Sig   Cyanocobalamin (B-12) 1000 MCG SUBL Place 1 tablet under the tongue daily.   desogestrel-ethinyl estradiol (KARIVA) 0.15-0.02/0.01 MG (21/5) tablet Take 1 tablet by mouth daily.    metoprolol succinate (TOPROL-XL) 50 MG 24 hr tablet Take 1 tablet (50 mg total) by mouth daily. Take with or immediately following a meal.   Rimegepant Sulfate (NURTEC) 75 MG TBDP Take 75 mg by mouth as needed (One tablet ($RemoveBefo'75mg'DrRHQCqHBya$ ) by mouth daily as needed).   triamterene-hydrochlorothiazide (MAXZIDE-25) 37.5-25 MG tablet Take 1 tablet by mouth daily.   Vitamin D, Ergocalciferol, (DRISDOL) 1.25 MG (50000 UNIT) CAPS capsule TAKE 1 CAPSULE (50,000 UNITS TOTAL) BY MOUTH EVERY 7 (SEVEN) DAYS   [DISCONTINUED] metoprolol succinate (TOPROL XL) 25 MG 24 hr tablet Take 1 tablet (25 mg total) by mouth daily.    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    Family History  Problem Relation Age of Onset   Hypertension Mother    Cancer Mother 59       Common Bile Duct   Hypertension Sister    Hypertension Brother    Diabetes Paternal Grandmother    Cancer Paternal Grandmother 53       brain cancer   Diabetes Paternal Grandfather    Hypertension Father    Cancer Father 58       kidney cancer   OBJCTIVE -PE, EKG, labs   Wt Readings from Last 3 Encounters:  09/04/21 224 lb (101.6 kg)  07/03/21 225 lb (102.1 kg)  07/02/21 224 lb 12.8 oz (102 kg)   Physical Exam: BP (!) 130/100 (BP Location: Right Arm, Patient Position: Sitting, Cuff Size: Large)   Pulse 83   Ht $R'5\' 4"'Yh$  (1.626 m)   Wt 224 lb (101.6 kg)   SpO2 99%   BMI 38.45 kg/m  Physical Exam Vitals reviewed.  Constitutional:       General: She is not in acute distress.    Appearance: Normal appearance. She is obese. She is not ill-appearing or toxic-appearing.     Comments: Well-groomed.  Monitor morbidly obese.  HENT:     Head: Normocephalic and atraumatic.  Neck:     Vascular: No  carotid bruit or JVD.  Cardiovascular:     Rate and Rhythm: Normal rate and regular rhythm. No extrasystoles are present.    Chest Wall: PMI is not displaced.     Pulses: Normal pulses and intact distal pulses.     Heart sounds: S1 normal and S2 normal. Heart sounds are distant. Murmur (~7/4 systolic murmur - RUSB) heard.     No friction rub. No gallop.  Pulmonary:     Effort: Pulmonary effort is normal. No respiratory distress.     Breath sounds: Normal breath sounds. No wheezing, rhonchi or rales.  Chest:     Chest wall: No tenderness.  Musculoskeletal:        General: No swelling. Normal range of motion.     Cervical back: Normal range of motion and neck supple.  Skin:    General: Skin is warm and dry.  Neurological:     General: No focal deficit present.     Mental Status: She is alert and oriented to person, place, and time.     Gait: Gait normal.  Psychiatric:        Mood and Affect: Mood normal.        Behavior: Behavior normal.        Thought Content: Thought content normal.        Judgment: Judgment normal.     Adult ECG Report  Rate: 83 ;  Rhythm: normal sinus rhythm, sinus arrhythmia, and mild NS ST-T changes. ;   Narrative Interpretation: stable EKG. HR slower  Recent Labs: Reviewed Lab Results  Component Value Date   CHOL 283 (H) 05/20/2021   HDL 83 05/20/2021   LDLCALC 169 (H) 05/20/2021   TRIG 159 (H) 05/20/2021   CHOLHDL 3.4 05/20/2021   Lab Results  Component Value Date   CREATININE 0.68 05/20/2021   BUN 9 05/20/2021   NA 141 05/20/2021   K 4.2 05/20/2021   CL 103 05/20/2021   CO2 27 05/20/2021      Latest Ref Rng & Units 04/04/2020    1:28 AM 11/22/2019   10:03 AM 07/17/2019    4:08 PM  CBC   WBC 4.0 - 10.5 K/uL 6.8   5.2   5.5    Hemoglobin 12.0 - 15.0 g/dL 15.7   12.1   11.7    Hematocrit 36.0 - 46.0 % 44.6   35.0   35.3    Platelets 150 - 400 K/uL 298   227   203      Lab Results  Component Value Date   HGBA1C 4.8 11/22/2019   Lab Results  Component Value Date   TSH 1.27 05/20/2021    ==================================================  COVID-19 Education: The signs and symptoms of COVID-19 were discussed with the patient and how to seek care for testing (follow up with PCP or arrange E-visit).    I spent a total of 16 minutes with the patient spent in direct patient consultation.  Additional time spent with chart review  / charting (studies, outside notes, etc): 15 min Total Time: 31 min  Current medicines are reviewed at length with the patient today.  (+/- concerns) n/a  This visit occurred during the SARS-CoV-2 public health emergency.  Safety protocols were in place, including screening questions prior to the visit, additional usage of staff PPE, and extensive cleaning of exam room while observing appropriate contact time as indicated for disinfecting solutions.  Notice: This dictation was prepared with Dragon dictation along with smart phrase technology. Any  transcriptional errors that result from this process are unintentional and may not be corrected upon review.   Studies Ordered:  Orders Placed This Encounter  Procedures   EKG 12-Lead    Patient Instructions / Medication Changes & Studies & Tests Ordered   Patient Instructions  Medication Instructions:  - Your physician has recommended you make the following change in your medication:   1) INCREASE Toprol (metoprolol succinate) to 50 mg: - take 1 tablet by mouth once daily   2) STOP Amlodipine   *If you need a refill on your cardiac medications before your next appointment, please call your pharmacy*   Lab Work: - none ordered  If you have labs (blood work) drawn today and your tests  are completely normal, you will receive your results only by: MyChart Message (if you have MyChart) OR A paper copy in the mail If you have any lab test that is abnormal or we need to change your treatment, we will call you to review the results.   Testing/Procedures: - none ordered   Follow-Up: At [facility], you and your health needs are our priority.  As part of our continuing mission to provide you with exceptional heart care, we have created designated Provider Care Teams.  These Care Teams include your primary Cardiologist (physician) and Advanced Practice Providers (APPs -  Physician Assistants and Nurse Practitioners) who all work together to provide you with the care you need, when you need it.  We recommend signing up for the patient portal called "MyChart".  Sign up information is provided on this After Visit Summary.  MyChart is used to connect with patients for Virtual Visits (Telemedicine).  Patients are able to view lab/test results, encounter notes, upcoming appointments, etc.  Non-urgent messages can be sent to your provider as well.   To learn more about what you can do with MyChart, go to [email or web address].    Your next appointment:   6 month(s)  The format for your next appointment:   In Person  Provider:   Glenetta Hew, MD    Other Instructions  1) Drink plenty of water  Important Information About Sugar          Glenetta Hew, M.D., M.S. Interventional Cardiologist   Pager # [phone] Phone # [phone] [address] [place], [place] [address]   Thank you for choosing Heartcare in [place]!!

## 2021-09-04 NOTE — Patient Instructions (Signed)
Medication Instructions:  - Your physician has recommended you make the following change in your medication:   1) INCREASE Toprol (metoprolol succinate) to 50 mg: - take 1 tablet by mouth once daily   2) STOP Amlodipine   *If you need a refill on your cardiac medications before your next appointment, please call your pharmacy*   Lab Work: - none ordered  If you have labs (blood work) drawn today and your tests are completely normal, you will receive your results only by: MyChart Message (if you have MyChart) OR A paper copy in the mail If you have any lab test that is abnormal or we need to change your treatment, we will call you to review the results.   Testing/Procedures: - none ordered   Follow-Up: At [facility], you and your health needs are our priority.  As part of our continuing mission to provide you with exceptional heart care, we have created designated Provider Care Teams.  These Care Teams include your primary Cardiologist (physician) and Advanced Practice Providers (APPs -  Physician Assistants and Nurse Practitioners) who all work together to provide you with the care you need, when you need it.  We recommend signing up for the patient portal called "MyChart".  Sign up information is provided on this After Visit Summary.  MyChart is used to connect with patients for Virtual Visits (Telemedicine).  Patients are able to view lab/test results, encounter notes, upcoming appointments, etc.  Non-urgent messages can be sent to your provider as well.   To learn more about what you can do with MyChart, go to [email or web address].    Your next appointment:   6 month(s)  The format for your next appointment:   In Person  Provider:   Bryan Lemma, MD    Other Instructions  1) Drink plenty of water  Important Information About Sugar

## 2021-09-18 DIAGNOSIS — Z419 Encounter for procedure for purposes other than remedying health state, unspecified: Secondary | ICD-10-CM | POA: Diagnosis not present

## 2021-10-05 ENCOUNTER — Encounter: Payer: Self-pay | Admitting: Cardiology

## 2021-10-05 NOTE — Assessment & Plan Note (Signed)
Tachycardia notably improved with Toprol.  Monitor relatively reassuring.  Only sinus tachycardia.  No arrhythmias.  Resting heart rate seems to have improved since starting beta-blocker.  Since she is not taking amlodipine regularly, we will simply titrate up Toprol to 50 mg daily.

## 2021-10-05 NOTE — Assessment & Plan Note (Signed)
Borderline pressures today but the elevated diastolic pressures.  Ultimately the best option here would be a diuretic, but with her palpitations I am leery of doing that.  She is also not taking her amlodipine regularly.  Plan: DC amlodipine and increase Toprol to 50 mg daily.

## 2021-10-05 NOTE — Assessment & Plan Note (Signed)
Encouraged ongoing weight loss.  We will increase hydration to try to avoid palpitations, this should hopefully help reduce how much she eats.  Continue to recommend exercise as this will also help tachycardia.  Need to watch out for signs of chronotropic incompetence.

## 2021-10-05 NOTE — Assessment & Plan Note (Signed)
As I thought, this is probably related to a flow murmur.  Less prominent today than it was before.  Valves look normal on echo.

## 2021-10-18 DIAGNOSIS — Z419 Encounter for procedure for purposes other than remedying health state, unspecified: Secondary | ICD-10-CM | POA: Diagnosis not present

## 2021-11-13 NOTE — Progress Notes (Unsigned)
Name: Lori Gross   MRN: [id number]    DOB: [birth year]   Date:11/14/2021       Progress Note  Subjective  Chief Complaint  Follow Up  HPI  HTN: BP is at goal, off norvasc ( stopped by cardiologist) and taking metoprolol and triamterene hctz. No side effects, no longer has palpitation, denies chest pain or dizziness.     Migraine : she states migraine was associated with photophobia and phonophobia, throbbing like and usually on right side of her head. She states no episodes in the past fe months.  She takes prn medication otc , usually improves with Tylenol . Unchanged   Obesity: she lost of weight when she stopped drinking sodas 2021 ,she works at [employer] full time she also has two small children at home. She is always moving,  She had lost about 60 pounds, weight August 2021 was down to 172 lbs but she states since Thanksgiving 2021 she has not been compliant with a healthy diet and has been gradually gaining weight again She asked for weight loss medication but explained not covered by Medicaid  Left plantar fasciitis: pain on left heel started about  3 months ago, pain when first gets up, stands all day at work and when she tries to get out of car at the end of the day the pain is intense. No swelling or redness, tender to touch, sometimes pain shoots up her leg intermittently    Abnormal pap: seen by gyn, and pap smear was back to normal and negative HPV, last pap was ASCUS and we will continue to check it yearly    B12 and Vitamin D; she is taking supplements again.   Palpitation:she is avoiding caffeine and is taking higher dose metoprolol since May and palpitation resolved. 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 Normal LV size & fxn. EF 55-60%. No RWMA. Gr 1 DD? But normal LA size. Normal Valves. Normal RV, RVSP & RAP.    Family History  Problem Relation Age of Onset   Hypertension Mother    Cancer Mother 87       Common Bile Duct   Hypertension Sister    Hypertension Brother    Diabetes Paternal Grandmother    Cancer Paternal Grandmother 67       brain cancer   Diabetes Paternal Grandfather    Hypertension Father    Cancer Father 20       kidney cancer    Social History   Tobacco Use   Smoking status: Never   Smokeless tobacco: Never  Substance Use Topics   Alcohol use: No    Alcohol/week: 0.0 standard drinks of alcohol     Current Outpatient Medications:    Cyanocobalamin (B-12) 1000  MCG SUBL, Place 1 tablet under the tongue daily., Disp: 100 tablet, Rfl: 1   desogestrel-ethinyl estradiol (KARIVA) 0.15-0.02/0.01 MG (21/5) tablet, Take 1 tablet by mouth daily., Disp: 84 tablet, Rfl: 3   metoprolol succinate (TOPROL-XL) 50 MG 24 hr tablet, Take 1 tablet (50 mg total) by mouth daily. Take with or immediately following a meal., Disp: 90 tablet, Rfl: 3   Rimegepant Sulfate (NURTEC) 75 MG TBDP, Take 75 mg by mouth as needed (One tablet (75mg ) by mouth daily as needed)., Disp: 8 tablet, Rfl: 3   triamterene-hydrochlorothiazide (MAXZIDE-25) 37.5-25 MG tablet, Take 1 tablet by mouth daily., Disp: 90 tablet, Rfl: 1   Vitamin D, Ergocalciferol, (DRISDOL) 1.25 MG (50000 UNIT) CAPS capsule, TAKE 1 CAPSULE  (50,000 UNITS TOTAL) BY MOUTH EVERY 7 (SEVEN) DAYS, Disp: 12 capsule, Rfl: 0  No Known Allergies  I personally reviewed active problem list, medication list, allergies, family history, social history, health maintenance with the patient/caregiver today.   ROS  Ten systems reviewed and is negative except as mentioned in HPI   Objective  Vitals:   11/14/21 1406  BP: 114/78  Pulse: 97  Resp: 16  Temp: 98 F (36.7 C)  TempSrc: Oral  SpO2: 98%  Weight: 229 lb 8 oz (104.1 kg)  Height: 5\' 4"  (1.626 m)    Body mass index is 39.39 kg/m.  Physical Exam  Constitutional: Patient appears well-developed and well-nourished. Obese  No distress.  HEENT: head atraumatic, normocephalic, pupils equal and reactive to light,, neck supple Cardiovascular: Normal rate, regular rhythm and normal heart sounds.  No murmur heard. No BLE edema. Pulmonary/Chest: Effort normal and breath sounds normal. No respiratory distress. Abdominal: Soft.  There is no tenderness. Muscular skeletal: pain during palpation of left heel where the plantar fascia inserts  Psychiatric: Patient has a normal mood and affect. behavior is normal. Judgment and thought content normal.    PHQ2/9:    11/14/2021    2:06 PM 06/10/2021   10:53 AM 05/20/2021    2:47 PM 01/06/2021    2:46 PM 10/04/2020   12:09 PM  Depression screen PHQ 2/9  Decreased Interest 0 0 0 0 0  Down, Depressed, Hopeless 0 0 0 0 0  PHQ - 2 Score 0 0 0 0 0  Altered sleeping 0 0 0    Tired, decreased energy 0 0 0    Change in appetite 0 0 0    Feeling bad or failure about yourself  0 0 0    Trouble concentrating 0 0 0    Moving slowly or fidgety/restless 0 0 0    Suicidal thoughts 0 0 0    PHQ-9 Score 0 0 0    Difficult doing work/chores Not difficult at all Not difficult at all Not difficult at all      phq 9 is negative   Fall Risk:    11/14/2021    2:06 PM 06/10/2021   10:52 AM 05/20/2021    2:47 PM 01/06/2021    2:46 PM 10/04/2020   12:09 PM   Fall Risk   Falls in the past year? 0 0 0 0 0  Number falls in past yr: 0 0 0  0  Injury with Fall? 0 0 0  0  Risk for fall due to : No Fall Risks No Fall Risks No Fall Risks    Follow up Falls prevention discussed;Education provided Falls prevention discussed Falls prevention discussed  Falls prevention discussed      Functional  Status Survey: Is the patient deaf or have difficulty hearing?: No Does the patient have difficulty seeing, even when wearing glasses/contacts?: No Does the patient have difficulty concentrating, remembering, or making decisions?: No Does the patient have difficulty walking or climbing stairs?: No Does the patient have difficulty dressing or bathing?: No Does the patient have difficulty doing errands alone such as visiting a doctor's office or shopping?: No    Assessment & Plan  1. Plantar fasciitis, left ***  2. B12 deficiency ***  3. Morbid obesity (HCC) ***  4. Hypertension, benign *** - triamterene-hydrochlorothiazide (MAXZIDE-25) 37.5-25 MG tablet; Take 1 tablet by mouth daily.  Dispense: 90 tablet; Refill: 1  5. Palpitations ***  6. Vitamin D deficiency ***  7. Migraine without aura and responsive to treatment ***  8. Dyslipidemia ***

## 2021-11-14 ENCOUNTER — Ambulatory Visit (INDEPENDENT_AMBULATORY_CARE_PROVIDER_SITE_OTHER): Payer: Medicaid Other | Admitting: Family Medicine

## 2021-11-14 ENCOUNTER — Encounter: Payer: Self-pay | Admitting: Family Medicine

## 2021-11-14 VITALS — BP 114/78 | HR 97 | Temp 98.0°F | Resp 16 | Ht 64.0 in | Wt 229.5 lb

## 2021-11-14 DIAGNOSIS — E785 Hyperlipidemia, unspecified: Secondary | ICD-10-CM

## 2021-11-14 DIAGNOSIS — R002 Palpitations: Secondary | ICD-10-CM | POA: Insufficient documentation

## 2021-11-14 DIAGNOSIS — M722 Plantar fascial fibromatosis: Secondary | ICD-10-CM | POA: Insufficient documentation

## 2021-11-14 DIAGNOSIS — I10 Essential (primary) hypertension: Secondary | ICD-10-CM | POA: Diagnosis not present

## 2021-11-14 DIAGNOSIS — E538 Deficiency of other specified B group vitamins: Secondary | ICD-10-CM | POA: Insufficient documentation

## 2021-11-14 DIAGNOSIS — G43009 Migraine without aura, not intractable, without status migrainosus: Secondary | ICD-10-CM

## 2021-11-14 DIAGNOSIS — E559 Vitamin D deficiency, unspecified: Secondary | ICD-10-CM | POA: Diagnosis not present

## 2021-11-14 HISTORY — DX: Palpitations: R00.2

## 2021-11-14 HISTORY — DX: Deficiency of other specified B group vitamins: E53.8

## 2021-11-14 HISTORY — DX: Vitamin D deficiency, unspecified: E55.9

## 2021-11-14 HISTORY — DX: Plantar fascial fibromatosis: M72.2

## 2021-11-14 MED ORDER — TRIAMTERENE-HCTZ 37.5-25 MG PO TABS: 1.0000 | ORAL_TABLET | Freq: Every day | ORAL | 1 refills | Status: DC

## 2021-11-14 NOTE — Patient Instructions (Signed)
[facility]  Plantar Fasciitis Rehab Ask your health care provider which exercises are safe for you. Do exercises exactly as told by your health care provider and adjust them as directed. It is normal to feel mild stretching, pulling, tightness, or discomfort as you do these exercises. Stop right away if you feel sudden pain or your pain gets worse. Do not begin these exercises until told by your health care provider. Stretching and range-of-motion exercises These exercises warm up your muscles and joints and improve the movement and flexibility of your foot. These exercises also help to relieve pain. Plantar fascia stretch  Sit with your left / right leg crossed over your opposite knee. Hold your heel with one hand with that thumb near your arch. With your other hand, hold your toes and gently pull them back toward the top of your foot. You should feel a stretch on the base (bottom) of your toes, or the bottom of your foot (plantar fascia), or both. Hold this stretch for__________ seconds. Slowly release your toes and return to the starting position. Repeat __________ times. Complete this exercise __________ times a day. Gastrocnemius stretch, standing This exercise is also called a calf (gastroc) stretch. It stretches the muscles in the back of the upper calf. Stand with your hands against a wall. Extend your left / right leg behind you, and bend your front knee slightly. Keeping your heels on the floor, your toes facing forward, and your back knee straight, shift your weight toward the wall. Do not arch your back. You should feel a gentle stretch in your upper calf. Hold this position for __________ seconds. Repeat __________ times. Complete this exercise __________ times a day. Soleus stretch, standing This exercise is also called a calf (soleus) stretch. It stretches the muscles in the back of the lower calf. Stand with your hands against a wall. Extend your left / right leg behind you, and  bend your front knee slightly. Keeping your heels on the floor and your toes facing forward, bend your back knee and shift your weight slightly over your back leg. You should feel a gentle stretch deep in your lower calf. Hold this position for __________ seconds. Repeat __________ times. Complete this exercise __________ times a day. Gastroc and soleus stretch, standing step This exercise stretches the muscles in the back of the lower leg. These muscles are in the upper calf (gastrocnemius) and the lower calf (soleus). Stand with the ball of your left / right foot on the front of a step. The ball of your foot is on the walking surface, right under your toes. Keep your other foot firmly on the same step. Hold on to the wall or a railing for balance. Slowly lift your other foot, allowing your body weight to press your heel down over the edge of the front of the step. Keep knee straight and unbent. You should feel a stretch in your calf. Hold this position for __________ seconds. Return both feet to the step. Repeat this exercise with a slight bend in your left / right knee. Repeat __________ times with your left / right knee straight and __________ times with your left / right knee bent. Complete this exercise __________ times a day. Balance exercise This exercise builds your balance and strength control of your arch to help take pressure off your plantar fascia. Single leg stand If this exercise is too easy, you can try it with your eyes closed or while standing on a pillow. Without shoes, stand  near a railing or in a doorway. You may hold on to the railing or door frame as needed. Stand on your left / right foot. Keep your big toe down on the floor and lift the arch of your foot. You should feel a stretch across the bottom of your foot and your arch. Do not let your foot roll inward. Hold this position for __________ seconds. Repeat __________ times. Complete this exercise __________ times a  day. This information is not intended to replace advice given to you by your health care provider. Make sure you discuss any questions you have with your health care provider. Document Revised: 01/18/2020 Document Reviewed: 01/18/2020 Elsevier Patient Education  2023 [employer].

## 2021-11-18 DIAGNOSIS — Z419 Encounter for procedure for purposes other than remedying health state, unspecified: Secondary | ICD-10-CM | POA: Diagnosis not present

## 2021-12-08 ENCOUNTER — Ambulatory Visit: Payer: Self-pay

## 2021-12-08 NOTE — Telephone Encounter (Signed)
Chief Complaint: High BP, wants birth control pill changed Symptoms: headache, blurred vision, fast heart rate (97) Frequency: all the time Pertinent Negatives: Patient denies chest pain Disposition: [x] ED /[] Urgent Care (no appt availability in office) / [] Appointment(In office/virtual)/ []  [facility]/ [] Home Care/ [x] Refused Recommended Disposition /[] [facility]/ []  Follow-up with PCP Additional Notes: Patient's blood pressure yesterday 120/140, P 97, she hasn't checked it today. She says that she's on 2 BP medications and her BP is still high so she wants to change birth control pills. She refused to go to the ED, saying her BP is fine now, she's only concerned about switching BC pills. Advised to check BP at home today and if top number greater than 160 and bottome greater than 100 to go to the ED, she verbalized understanding. Advised I will send this to Dr. and someone will call with her recommendation.   Reason for Disposition  [1] Systolic BP  >= 160 OR Diastolic >= 100 AND [2] cardiac (e.g., breathing difficulty, chest pain) or neurologic symptoms (e.g., new-onset blurred or double vision, unsteady gait)  Answer Assessment - Initial Assessment Questions 1. BLOOD PRESSURE: "What is the blood pressure?" "Did you take at least two measurements 5 minutes apart?"     120/140 2. ONSET: "When did you take your blood pressure?"     Yesterday 3. HOW: "How did you take your blood pressure?" (e.g., automatic home BP monitor, visiting nurse)     Automatic home BP cuff 4. HISTORY: "Do you have a history of high blood pressure?"     Yes 5. MEDICINES: "Are you taking any medicines for blood pressure?" "Have you missed any doses recently?"     Yes, no missed does 6. OTHER SYMPTOMS: "Do you have any symptoms?" (e.g., blurred vision, chest pain, difficulty breathing, headache, weakness)     Headache, blurred vision, heart rate beating fast (97) 7. PREGNANCY: "Is there  any chance you are pregnant?" "When was your last menstrual period?"     No on birth control  Protocols used: Blood Pressure - High-A-AH

## 2021-12-09 NOTE — Telephone Encounter (Signed)
Called and left voicemail for patient.

## 2021-12-19 DIAGNOSIS — Z419 Encounter for procedure for purposes other than remedying health state, unspecified: Secondary | ICD-10-CM | POA: Diagnosis not present

## 2022-01-18 DIAGNOSIS — Z419 Encounter for procedure for purposes other than remedying health state, unspecified: Secondary | ICD-10-CM | POA: Diagnosis not present

## 2022-01-26 ENCOUNTER — Encounter: Payer: Self-pay | Admitting: Obstetrics and Gynecology

## 2022-02-18 DIAGNOSIS — Z419 Encounter for procedure for purposes other than remedying health state, unspecified: Secondary | ICD-10-CM | POA: Diagnosis not present

## 2022-02-23 NOTE — Progress Notes (Deleted)
Cardiology Clinic Note   Patient Name: Lori Gross Date of Encounter: 02/23/2022  Primary Care Provider:  Alba Cory, MD Primary Cardiologist:  Bryan Lemma, MD  Patient Profile    26 year old female with a past medical history of hypertension, tachycardia, heart murmur, and obesity, who presents today for follow-up.  Past Medical History    Past Medical History:  Diagnosis Date   Benign headache    Dysmenorrhea    Grieving    Hyperlipidemia due to dietary fat intake    Hypertension    Marijuana use 04/20/2014   positive UDS   Microalbuminuria    Migraine without aura and without status migrainosus, not intractable    Morbid obesity due to excess calories (HCC)    BMI 37-38 with HTN & HLD   Obesity    Urticaria    Past Surgical History:  Procedure Laterality Date   NO PAST SURGERIES     TRANSTHORACIC ECHOCARDIOGRAM  07/30/2021   HR 100 bpm. Normal LV size & fxn. EF 55-60%. No RWMA. Gr 1 DD? But normal LA size. Normal Valves. Normal RV, RVSP & RAP.    Allergies  No Known Allergies  History of Present Illness    Lori Gross is a 26 year old female with a history of hypertension on Maxide and amlodipine, hyperlipidemia, obesity, and tachycardia.  Echocardiogram completed 07/31/2021 revealed LVEF of 55-60%, no regional wall motion abnormalities, G1 DD, and no valvular abnormalities noted.  Long-term monitor revealed predominant underlying rhythm was sinus rhythm with a minimum heart rate of 54 and maximum heart rate of 151 bpm with average heart rate of 92 bpm, rare isolated PACs and PVCs without couplets or triplets.  Essentially normal monitor.  Patient was last seen in clinic by Dr. Herbie Baltimore on 09/04/2021 at that time she had followed up on a previous echocardiogram and ZIO monitor results that she had had she also stated she was doing well.  The only concern at that time was unfortunately she was forgetting to take her p.m. dose of amlodipine.   So at that time her Toprol-XL was increased to 50 mg daily and amlodipine was discontinued.  She returns to clinic today  Home Medications    Current Outpatient Medications  Medication Sig Dispense Refill   Cholecalciferol (VITAMIN D) 50 MCG (2000 UT) CAPS Take 1 capsule by mouth daily.     Cyanocobalamin (B-12) 1000 MCG SUBL Place 1 tablet under the tongue daily. 100 tablet 1   desogestrel-ethinyl estradiol (KARIVA) 0.15-0.02/0.01 MG (21/5) tablet Take 1 tablet by mouth daily. 84 tablet 3   metoprolol succinate (TOPROL-XL) 50 MG 24 hr tablet Take 1 tablet (50 mg total) by mouth daily. Take with or immediately following a meal. 90 tablet 3   Rimegepant Sulfate (NURTEC) 75 MG TBDP Take 75 mg by mouth as needed (One tablet (75mg ) by mouth daily as needed). 8 tablet 3   triamterene-hydrochlorothiazide (MAXZIDE-25) 37.5-25 MG tablet Take 1 tablet by mouth daily. 90 tablet 1   No current facility-administered medications for this visit.     Family History    Family History  Problem Relation Age of Onset   Hypertension Mother    Cancer Mother 58       Common Bile Duct   Hypertension Sister    Hypertension Brother    Diabetes Paternal Grandmother    Cancer Paternal Grandmother 74       brain cancer   Diabetes Paternal Grandfather    Hypertension Father  Cancer Father 59       kidney cancer   She indicated that her mother is deceased. She indicated that her father is alive. She indicated that her sister is alive. She indicated that her brother is alive. She indicated that her maternal grandmother is alive. She indicated that her paternal grandmother is deceased. She indicated that her paternal grandfather is deceased. She indicated that her daughter is alive. She indicated that her son is alive.  Social History    Social History   Socioeconomic History   Marital status: Significant Other    Spouse name: Not on file   Number of children: 2   Years of education: Not on file    Highest education level: High school graduate  Occupational History   Occupation: Pension scheme manager: [employer]    Comment: [employer]  Tobacco Use   Smoking status: Never   Smokeless tobacco: Never  Vaping Use   Vaping Use: Never used  Substance and Sexual Activity   Alcohol use: No    Alcohol/week: 0.0 standard drinks of alcohol   Drug use: No   Sexual activity: Yes    Partners: Male    Birth control/protection: Pill  Other Topics Concern   Not on file  Social History Narrative   Dating for 5 years Rolan Bucco ) they have  a baby girl - Primitivo Gauze on July 21st, 2017 and one boy named Julius-they are  living together   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    Social Determinants of Health   Financial Resource Strain: Low Risk  (07/15/2018)   Overall Financial Resource Strain (CARDIA)    Difficulty of Paying Living Expenses: Not hard at all  Food Insecurity: No Food Insecurity (07/15/2018)   Hunger Vital Sign    Worried About Running Out of Food in the Last Year: Never true    Ran Out of Food in the Last Year: Never true  Transportation Needs: No Transportation Needs (07/15/2018)   PRAPARE - Hydrologist (Medical): No    Lack of Transportation (Non-Medical): No  Physical Activity: Sufficiently Active (11/15/2018)   Exercise Vital Sign    Days of Exercise per Week: 5 days    Minutes of Exercise per Session: 30 min  Stress: No Stress Concern Present (07/15/2018)   [place]    Feeling of Stress : Not at all  Social Connections: Moderately Isolated (07/15/2018)   Social Connection and Isolation Panel [NHANES]    Frequency of Communication with Friends and Family: Once a week    Frequency of Social Gatherings with Friends and Family: Once a week    Attends Religious Services: Never    Marine scientist or Organizations: No    Attends  Archivist Meetings: Never    Marital Status: Living with partner  Intimate Partner Violence: Not At Risk (07/15/2018)   Humiliation, Afraid, Rape, and Kick questionnaire    Fear of Current or Ex-Partner: No    Emotionally Abused: No    Physically Abused: No    Sexually Abused: No     Review of Systems    General:  No chills, fever, night sweats or weight changes.  Cardiovascular:  No chest pain, dyspnea on exertion, edema, orthopnea, palpitations, paroxysmal nocturnal dyspnea. Dermatological: No rash, lesions/masses Respiratory: No cough, dyspnea Urologic: No hematuria, dysuria Abdominal:   No nausea,  vomiting, diarrhea, bright red blood per rectum, melena, or hematemesis Neurologic:  No visual changes, wkns, changes in mental status. All other systems reviewed and are otherwise negative except as noted above.     Physical Exam    VS:  There were no vitals taken for this visit. , BMI There is no height or weight on file to calculate BMI.     GEN: Well nourished, well developed, in no acute distress. HEENT: normal. Neck: Supple, no JVD, carotid bruits, or masses. Cardiac: RRR, no murmurs, rubs, or gallops. No clubbing, cyanosis, edema.  Radials/DP/PT 2+ and equal bilaterally.  Respiratory:  Respirations regular and unlabored, clear to auscultation bilaterally. GI: Soft, nontender, nondistended, BS + x 4. MS: no deformity or atrophy. Skin: warm and dry, no rash. Neuro:  Strength and sensation are intact. Psych: Normal affect.  Accessory Clinical Findings    ECG personally reviewed by me today- *** - No acute changes  Lab Results  Component Value Date   WBC 6.8 04/04/2020   HGB 15.7 (H) 04/04/2020   HCT 44.6 04/04/2020   MCV 97.8 04/04/2020   PLT 298 04/04/2020   Lab Results  Component Value Date   CREATININE 0.68 05/20/2021   BUN 9 05/20/2021   NA 141 05/20/2021   K 4.2 05/20/2021   CL 103 05/20/2021   CO2 27 05/20/2021   Lab Results  Component  Value Date   ALT 11 05/20/2021   AST 16 05/20/2021   ALKPHOS 53 04/04/2020   BILITOT 0.3 05/20/2021   Lab Results  Component Value Date   CHOL 283 (H) 05/20/2021   HDL 83 05/20/2021   LDLCALC 169 (H) 05/20/2021   TRIG 159 (H) 05/20/2021   CHOLHDL 3.4 05/20/2021    Lab Results  Component Value Date   HGBA1C 4.8 11/22/2019    Assessment & Plan   1.  ***  Breniya Goertzen, NP 02/23/2022, 4:23 PM

## 2022-02-24 ENCOUNTER — Ambulatory Visit: Payer: Medicaid Other | Attending: Cardiology | Admitting: Cardiology

## 2022-02-25 ENCOUNTER — Encounter: Payer: Self-pay | Admitting: Cardiology

## 2022-03-05 ENCOUNTER — Ambulatory Visit: Payer: Medicaid Other | Admitting: Cardiology

## 2022-03-20 DIAGNOSIS — Z419 Encounter for procedure for purposes other than remedying health state, unspecified: Secondary | ICD-10-CM | POA: Diagnosis not present

## 2022-04-20 DIAGNOSIS — Z419 Encounter for procedure for purposes other than remedying health state, unspecified: Secondary | ICD-10-CM | POA: Diagnosis not present

## 2022-05-14 NOTE — Progress Notes (Signed)
Name: Lori Gross   MRN: [id number]    DOB: 1995-05-26   Date:05/15/2022       Progress Note  Subjective  Chief Complaint  Follow Up  HPI  HTN: BP is at goal, off norvasc ( stopped by cardiologist) and taking metoprolol and triamterene hctz. No side effects, no longer has palpitation, denies orthostatic changed   Viral illness: she states developed rhinorrhea, body aches, fatigue, chills for the past few days., she has lack of taste and smell sensation is decreased .  Low grade fever.     Migraine : she states migraine was associated with photophobia and phonophobia, throbbing like and usually on right side of her head. She states no episodes  in a long time   Obesity: she lost of weight when she stopped drinking sodas 2021 ,she works at [employer] full time she also has two small children at home. She is always moving,  She had lost about 60 pounds, weight August 2021 was down to 172 lbs but she states since Thanksgiving 2021 she has not been compliant with a healthy diet and has been gradually gaining weight but stable since July 2023 at 233 lbs    Abnormal pap: seen by gyn, and pap smear was back to normal and negative HPV, last pap was ASCUS and we will continue to check it yearly . Due in March    B12 and Vitamin D; she has been taking supplements as recommended   Palpitation:she is avoiding caffeine and is taking higher dose metoprolol since May 2023  and palpitation resolved.   Dyslipidemia: discussed life style modification. 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 Normal LV size & fxn. EF 55-60%. No RWMA. Gr 1 DD? But normal LA size. Normal Valves. Normal RV, RVSP & RAP.    Family History  Problem Relation Age of Onset   Hypertension Mother    Cancer Mother 68       Common Bile Duct   Hypertension Sister    Hypertension Brother    Diabetes Paternal Grandmother    Cancer Paternal Grandmother 49       brain cancer   Diabetes Paternal Grandfather    Hypertension Father    Cancer Father 42       kidney cancer    Social History   Tobacco Use   Smoking status: Never   Smokeless tobacco: Never  Substance Use Topics   Alcohol use: No    Alcohol/week: 0.0 standard drinks of alcohol     Current Outpatient Medications:    Cholecalciferol (VITAMIN D) 50 MCG (2000 UT) CAPS, Take 1 capsule by mouth daily., Disp: , Rfl:    Cyanocobalamin (B-12) 1000 MCG SUBL, Place 1 tablet under the tongue daily., Disp: 100 tablet, Rfl: 1   Rimegepant Sulfate (NURTEC) 75 MG TBDP, Take 75  mg by mouth as needed (One tablet (75mg ) by mouth daily as needed)., Disp: 8 tablet, Rfl: 3   triamterene-hydrochlorothiazide (MAXZIDE-25) 37.5-25 MG tablet, Take 1 tablet by mouth daily., Disp: 90 tablet, Rfl: 1   desogestrel-ethinyl estradiol (KARIVA) 0.15-0.02/0.01 MG (21/5) tablet, Take 1 tablet by mouth daily. (Patient not taking: Reported on 05/15/2022), Disp: 84 tablet, Rfl: 3   metoprolol succinate (TOPROL-XL) 50 MG 24 hr tablet, Take 1 tablet (50 mg total) by mouth daily. Take with or immediately following a meal., Disp: 90 tablet, Rfl: 3  No Known Allergies  I personally reviewed active problem list, medication list, allergies, family history, social history, health maintenance with the patient/caregiver  today.   ROS  Constitutional: Negative for fever or weight change.  Respiratory: Negative for cough and shortness of breath.   Cardiovascular: Negative for chest pain or palpitations.  Gastrointestinal: Negative for abdominal pain, no bowel changes.  Musculoskeletal: Negative for gait problem or joint swelling.  Skin: Negative for rash.  Neurological: Negative for dizziness or headache.  No other specific complaints in a complete review of systems (except as listed in HPI above).   Objective  Vitals:   05/15/22 1431  BP: 116/74  Pulse: 91  Resp: 16  Temp: 97.8 F (36.6 C)  TempSrc: Oral  SpO2: 97%  Weight: 233 lb (105.7 kg)  Height: 5' 5.03" (1.652 m)    Body mass index is 38.74 kg/m.  Physical Exam  Constitutional: Patient appears well-developed and well-nourished. Obese  No distress.  HEENT: head atraumatic, normocephalic, pupils equal and reactive to light, ears normal TM, neck supple, throat within normal limits, clear rhinorrhea  Cardiovascular: Normal rate, regular rhythm and normal heart sounds.  No murmur heard. No BLE edema. Pulmonary/Chest: Effort normal and breath sounds normal. No respiratory distress. Abdominal: Soft.  There is no tenderness. Psychiatric: Patient has a normal mood and affect. behavior is normal. Judgment and thought content normal.   PHQ2/9:    05/15/2022    2:37 PM 11/14/2021    2:06 PM 06/10/2021   10:53 AM 05/20/2021    2:47 PM 01/06/2021    2:46 PM  Depression screen PHQ 2/9  Decreased Interest 0 0 0 0 0  Down, Depressed, Hopeless 0 0 0 0 0  PHQ - 2 Score 0 0 0 0 0  Altered sleeping 0 0 0 0   Tired, decreased energy 0 0 0 0   Change in appetite 0 0 0 0   Feeling bad or failure about yourself  0 0 0 0   Trouble concentrating 0 0 0 0   Moving slowly or fidgety/restless 0 0 0 0   Suicidal thoughts 0 0 0 0   PHQ-9 Score 0 0 0 0   Difficult doing work/chores  Not difficult at all Not difficult at all Not difficult at all     phq 9  is negative   Fall Risk:    05/15/2022    2:36 PM 11/14/2021    2:06 PM 06/10/2021   10:52 AM 05/20/2021    2:47 PM 01/06/2021    2:46 PM  Fall Risk   Falls in the past year? 0 0 0 0 0  Number falls in past yr:  0 0 0   Injury with Fall?  0 0 0   Risk for fall due to : No Fall Risks No Fall Risks No Fall Risks No Fall Risks   Follow up Falls prevention discussed Falls prevention discussed;Education provided Falls prevention  discussed Falls prevention discussed       Functional Status Survey: Is the patient deaf or have difficulty hearing?: No Does the patient have difficulty seeing, even when wearing glasses/contacts?: No Does the patient have difficulty concentrating, remembering, or making decisions?: No Does the patient have difficulty walking or climbing stairs?: No Does the patient have difficulty dressing or bathing?: No Does the patient have difficulty doing errands alone such as visiting a doctor's office or shopping?: No    Assessment & Plan  1. Morbid obesity (HCC)  Discussed with the patient the risk posed by an increased BMI. Discussed importance of portion control, calorie counting and at least 150 minutes of physical activity weekly. Avoid sweet beverages and drink more water. Eat at least 6 servings of fruit and vegetables daily    2. Viral illness  - POCT Influenza A/B - Novel Coronavirus, NAA ([facility])  3. Hypertension, benign  - triamterene-hydrochlorothiazide (MAXZIDE-25) 37.5-25 MG tablet; Take 1 tablet by mouth daily.  Dispense: 90 tablet; Refill: 1 - COMPLETE METABOLIC PANEL WITH GFR  4. B12 deficiency  - CBC with Differential/Platelet - B12 and Folate Panel  5. Vitamin D deficiency  - VITAMIN D 25 Hydroxy (Vit-D Deficiency, Fractures)  6. Palpitations   7. Dyslipidemia  - Lipid panel  8. Migraine without aura and responsive to treatment   9. History of iron deficiency anemia   10. Routine screening for STI (sexually transmitted  infection)  - RPR - HIV Antibody (routine testing w rflx) She wants to hold off to get GC during visit with gyn   Separated past two months   11. Diabetes mellitus screening  - Hemoglobin A1c

## 2022-05-15 ENCOUNTER — Ambulatory Visit: Payer: Medicaid Other | Admitting: Family Medicine

## 2022-05-15 ENCOUNTER — Encounter: Payer: Self-pay | Admitting: Family Medicine

## 2022-05-15 DIAGNOSIS — Z862 Personal history of diseases of the blood and blood-forming organs and certain disorders involving the immune mechanism: Secondary | ICD-10-CM

## 2022-05-15 DIAGNOSIS — R002 Palpitations: Secondary | ICD-10-CM | POA: Diagnosis not present

## 2022-05-15 DIAGNOSIS — E538 Deficiency of other specified B group vitamins: Secondary | ICD-10-CM | POA: Diagnosis not present

## 2022-05-15 DIAGNOSIS — E785 Hyperlipidemia, unspecified: Secondary | ICD-10-CM

## 2022-05-15 DIAGNOSIS — B349 Viral infection, unspecified: Secondary | ICD-10-CM

## 2022-05-15 DIAGNOSIS — E559 Vitamin D deficiency, unspecified: Secondary | ICD-10-CM | POA: Diagnosis not present

## 2022-05-15 DIAGNOSIS — G43009 Migraine without aura, not intractable, without status migrainosus: Secondary | ICD-10-CM

## 2022-05-15 DIAGNOSIS — Z131 Encounter for screening for diabetes mellitus: Secondary | ICD-10-CM | POA: Diagnosis not present

## 2022-05-15 DIAGNOSIS — I10 Essential (primary) hypertension: Secondary | ICD-10-CM

## 2022-05-15 DIAGNOSIS — Z113 Encounter for screening for infections with a predominantly sexual mode of transmission: Secondary | ICD-10-CM

## 2022-05-15 LAB — POCT INFLUENZA A/B
Influenza A, POC: NEGATIVE
Influenza B, POC: NEGATIVE

## 2022-05-15 MED ORDER — TRIAMTERENE-HCTZ 37.5-25 MG PO TABS: 1.0000 | ORAL_TABLET | Freq: Every day | ORAL | 1 refills | Status: DC

## 2022-05-16 LAB — CBC WITH DIFFERENTIAL/PLATELET
Absolute Monocytes: 396 cells/uL (ref 200–950)
Basophils Absolute: 43 cells/uL (ref 0–200)
Basophils Relative: 0.6 %
Eosinophils Absolute: 238 cells/uL (ref 15–500)
Eosinophils Relative: 3.3 %
HCT: 37 % (ref 35.0–45.0)
Hemoglobin: 12.8 g/dL (ref 11.7–15.5)
Lymphs Abs: 2088 cells/uL (ref 850–3900)
MCH: 33.4 pg — ABNORMAL HIGH (ref 27.0–33.0)
MCHC: 34.6 g/dL (ref 32.0–36.0)
MCV: 96.6 fL (ref 80.0–100.0)
MPV: 10.4 fL (ref 7.5–12.5)
Monocytes Relative: 5.5 %
Neutro Abs: 4435 cells/uL (ref 1500–7800)
Neutrophils Relative %: 61.6 %
Platelets: 285 10*3/uL (ref 140–400)
RBC: 3.83 10*6/uL (ref 3.80–5.10)
RDW: 13.6 % (ref 11.0–15.0)
Total Lymphocyte: 29 %
WBC: 7.2 10*3/uL (ref 3.8–10.8)

## 2022-05-16 LAB — B12 AND FOLATE PANEL
Folate: 16.8 ng/mL
Vitamin B-12: 339 pg/mL (ref 200–1100)

## 2022-05-16 LAB — LIPID PANEL
Cholesterol: 203 mg/dL — ABNORMAL HIGH (ref <200)
HDL: 58 mg/dL (ref >=50)
LDL Cholesterol (Calc): 117 mg/dL (calc) — ABNORMAL HIGH
Non-HDL Cholesterol (Calc): 145 mg/dL (calc) — ABNORMAL HIGH (ref <130)
Total CHOL/HDL Ratio: 3.5 (calc) (ref <5.0)
Triglycerides: 167 mg/dL — ABNORMAL HIGH (ref <150)

## 2022-05-16 LAB — COMPLETE METABOLIC PANEL WITH GFR
AG Ratio: 1.4 (calc) (ref 1.0–2.5)
ALT: 38 U/L — ABNORMAL HIGH (ref 6–29)
AST: 32 U/L — ABNORMAL HIGH (ref 10–30)
Albumin: 4.4 g/dL (ref 3.6–5.1)
Alkaline phosphatase (APISO): 118 U/L (ref 31–125)
BUN: 8 mg/dL (ref 7–25)
CO2: 27 mmol/L (ref 20–32)
Calcium: 9.3 mg/dL (ref 8.6–10.2)
Chloride: 100 mmol/L (ref 98–110)
Creat: 0.71 mg/dL (ref 0.50–0.96)
Globulin: 3.2 g/dL (calc) (ref 1.9–3.7)
Glucose, Bld: 140 mg/dL — ABNORMAL HIGH (ref 65–99)
Potassium: 3.2 mmol/L — ABNORMAL LOW (ref 3.5–5.3)
Sodium: 140 mmol/L (ref 135–146)
Total Bilirubin: 0.3 mg/dL (ref 0.2–1.2)
Total Protein: 7.6 g/dL (ref 6.1–8.1)
eGFR: 119 mL/min/{1.73_m2} (ref >=60)

## 2022-05-16 LAB — HEMOGLOBIN A1C
Hgb A1c MFr Bld: 5.7 % of total Hgb — ABNORMAL HIGH (ref <5.7)
Mean Plasma Glucose: 117 mg/dL
eAG (mmol/L): 6.5 mmol/L

## 2022-05-16 LAB — VITAMIN D 25 HYDROXY (VIT D DEFICIENCY, FRACTURES): Vit D, 25-Hydroxy: 24 ng/mL — ABNORMAL LOW (ref 30–100)

## 2022-05-16 LAB — NOVEL CORONAVIRUS, NAA: SARS-CoV-2, NAA: DETECTED — AB

## 2022-05-16 LAB — RPR: RPR Ser Ql: NONREACTIVE

## 2022-05-16 LAB — HIV ANTIBODY (ROUTINE TESTING W REFLEX): HIV 1&2 Ab, 4th Generation: NONREACTIVE

## 2022-05-21 DIAGNOSIS — Z419 Encounter for procedure for purposes other than remedying health state, unspecified: Secondary | ICD-10-CM | POA: Diagnosis not present

## 2022-06-19 DIAGNOSIS — Z419 Encounter for procedure for purposes other than remedying health state, unspecified: Secondary | ICD-10-CM | POA: Diagnosis not present

## 2022-07-06 ENCOUNTER — Encounter: Payer: Self-pay | Admitting: Obstetrics and Gynecology

## 2022-07-09 ENCOUNTER — Emergency Department: Payer: Medicaid Other

## 2022-07-09 ENCOUNTER — Emergency Department
Admission: EM | Admit: 2022-07-09 | Discharge: 2022-07-09 | Disposition: A | Discharge status: Home / Self Care | Payer: Medicaid Other | Attending: Emergency Medicine | Admitting: Emergency Medicine

## 2022-07-09 DIAGNOSIS — D72829 Elevated white blood cell count, unspecified: Secondary | ICD-10-CM | POA: Diagnosis not present

## 2022-07-09 DIAGNOSIS — R109 Unspecified abdominal pain: Secondary | ICD-10-CM | POA: Diagnosis not present

## 2022-07-09 DIAGNOSIS — I10 Essential (primary) hypertension: Secondary | ICD-10-CM | POA: Insufficient documentation

## 2022-07-09 DIAGNOSIS — N390 Urinary tract infection, site not specified: Secondary | ICD-10-CM

## 2022-07-09 DIAGNOSIS — R300 Dysuria: Secondary | ICD-10-CM | POA: Diagnosis present

## 2022-07-09 DIAGNOSIS — K760 Fatty (change of) liver, not elsewhere classified: Secondary | ICD-10-CM | POA: Diagnosis not present

## 2022-07-09 LAB — COMPREHENSIVE METABOLIC PANEL
ALT: 51 U/L — ABNORMAL HIGH (ref 0–44)
AST: 39 U/L (ref 15–41)
Albumin: 4.2 g/dL (ref 3.5–5.0)
Alkaline Phosphatase: 101 U/L (ref 38–126)
Anion gap: 9 (ref 5–15)
BUN: 9 mg/dL (ref 6–20)
CO2: 27 mmol/L (ref 22–32)
Calcium: 9 mg/dL (ref 8.9–10.3)
Chloride: 100 mmol/L (ref 98–111)
Creatinine, Ser: 0.7 mg/dL (ref 0.44–1.00)
GFR, Estimated: >60 mL/min (ref >60)
Glucose, Bld: 117 mg/dL — ABNORMAL HIGH (ref 70–99)
Potassium: 3.3 mmol/L — ABNORMAL LOW (ref 3.5–5.1)
Sodium: 136 mmol/L (ref 135–145)
Total Bilirubin: 0.6 mg/dL (ref 0.3–1.2)
Total Protein: 8.2 g/dL — ABNORMAL HIGH (ref 6.5–8.1)

## 2022-07-09 LAB — CBC WITH DIFFERENTIAL/PLATELET
Abs Immature Granulocytes: 0.05 10*3/uL (ref 0.00–0.07)
Basophils Absolute: 0 10*3/uL (ref 0.0–0.1)
Basophils Relative: 0 %
Eosinophils Absolute: 0.1 10*3/uL (ref 0.0–0.5)
Eosinophils Relative: 1 %
HCT: 37.1 % (ref 36.0–46.0)
Hemoglobin: 12.1 g/dL (ref 12.0–15.0)
Immature Granulocytes: 1 %
Lymphocytes Relative: 28 %
Lymphs Abs: 3 10*3/uL (ref 0.7–4.0)
MCH: 32.8 pg (ref 26.0–34.0)
MCHC: 32.6 g/dL (ref 30.0–36.0)
MCV: 100.5 fL — ABNORMAL HIGH (ref 80.0–100.0)
Monocytes Absolute: 0.5 10*3/uL (ref 0.1–1.0)
Monocytes Relative: 4 %
Neutro Abs: 7.2 10*3/uL (ref 1.7–7.7)
Neutrophils Relative %: 66 %
Platelets: 329 10*3/uL (ref 150–400)
RBC: 3.69 MIL/uL — ABNORMAL LOW (ref 3.87–5.11)
RDW: 13.7 % (ref 11.5–15.5)
WBC: 10.9 10*3/uL — ABNORMAL HIGH (ref 4.0–10.5)
nRBC: 0 % (ref 0.0–0.2)

## 2022-07-09 LAB — URINALYSIS, ROUTINE W REFLEX MICROSCOPIC
Bilirubin Urine: NEGATIVE
Glucose, UA: NEGATIVE mg/dL
Ketones, ur: NEGATIVE mg/dL
Nitrite: NEGATIVE
Protein, ur: 100 mg/dL — AB
Specific Gravity, Urine: 1.025 (ref 1.005–1.030)
WBC, UA: >50 WBC/hpf (ref 0–5)
pH: 7 (ref 5.0–8.0)

## 2022-07-09 LAB — POC URINE PREG, ED: Preg Test, Ur: NEGATIVE

## 2022-07-09 MED ORDER — CEPHALEXIN 500 MG PO CAPS
500.0000 mg | ORAL_CAPSULE | Freq: Once | ORAL | Status: AC
  Administered 2022-07-09: 500 mg via ORAL
  Filled 2022-07-09: qty 1

## 2022-07-09 MED ORDER — PHENAZOPYRIDINE HCL 200 MG PO TABS
200.0000 mg | ORAL_TABLET | Freq: Once | ORAL | Status: AC
  Administered 2022-07-09: 200 mg via ORAL
  Filled 2022-07-09: qty 1

## 2022-07-09 MED ORDER — CEPHALEXIN 500 MG PO CAPS: 500.0000 mg | ORAL_CAPSULE | Freq: Three times a day (TID) | ORAL | 0 refills | Status: DC

## 2022-07-09 MED ORDER — PHENAZOPYRIDINE HCL 200 MG PO TABS: 200.0000 mg | ORAL_TABLET | Freq: Three times a day (TID) | ORAL | 0 refills | Status: DC | PRN

## 2022-07-09 NOTE — Progress Notes (Deleted)
There were no vitals taken for this visit.   Subjective:    Patient ID: Lori Gross, female    DOB: 1995/04/24, 27 y.o.   MRN: [id number]  HPI: Lori Gross is a 27 y.o. female  No chief complaint on file.   Relevant past medical, surgical, family and social history reviewed and updated as indicated. Interim medical history since our last visit reviewed. Allergies and medications reviewed and updated.  Review of Systems  Constitutional: Negative for fever or weight change.  Respiratory: Negative for cough and shortness of breath.   Cardiovascular: Negative for chest pain or palpitations.  Gastrointestinal: Negative for abdominal pain, no bowel changes.  Musculoskeletal: Negative for gait problem or joint swelling.  Skin: Negative for rash.  Neurological: Negative for dizziness or headache.  No other specific complaints in a complete review of systems (except as listed in HPI above).      Objective:    There were no vitals taken for this visit.  Wt Readings from Last 3 Encounters:  05/15/22 233 lb (105.7 kg)  11/14/21 229 lb 8 oz (104.1 kg)  09/04/21 224 lb (101.6 kg)    Physical Exam  Constitutional: Patient appears well-developed and well-nourished. Obese *** No distress.  HEENT: head atraumatic, normocephalic, pupils equal and reactive to light, ears ***, neck supple, throat within normal limits Cardiovascular: Normal rate, regular rhythm and normal heart sounds.  No murmur heard. No BLE edema. Pulmonary/Chest: Effort normal and breath sounds normal. No respiratory distress. Abdominal: Soft.  There is no tenderness. Psychiatric: Patient has a normal mood and affect. behavior is normal. Judgment and thought content normal.   Results for orders placed or performed in visit on 05/15/22  Novel Coronavirus, NAA ([facility])   Specimen: Nasopharyngeal(NP) swabs in vial transport medium   Nasopharynge  Previous  Result Value Ref Range   SARS-CoV-2, NAA Detected  (A) Not Detected  Lipid panel  Result Value Ref Range   Cholesterol 203 (H) <200 mg/dL   HDL 58 > OR = 50 mg/dL   Triglycerides 167 (H) <150 mg/dL   LDL Cholesterol (Calc) 117 (H) mg/dL (calc)   Total CHOL/HDL Ratio 3.5 <5.0 (calc)   Non-HDL Cholesterol (Calc) 145 (H) <130 mg/dL (calc)  CBC with Differential/Platelet  Result Value Ref Range   WBC 7.2 3.8 - 10.8 Thousand/uL   RBC 3.83 3.80 - 5.10 Million/uL   Hemoglobin 12.8 11.7 - 15.5 g/dL   HCT 37.0 35.0 - 45.0 %   MCV 96.6 80.0 - 100.0 fL   MCH 33.4 (H) 27.0 - 33.0 pg   MCHC 34.6 32.0 - 36.0 g/dL   RDW 13.6 11.0 - 15.0 %   Platelets 285 140 - 400 Thousand/uL   MPV 10.4 7.5 - 12.5 fL   Neutro Abs 4,435 1,500 - 7,800 cells/uL   Lymphs Abs 2,088 850 - 3,900 cells/uL   Absolute Monocytes 396 200 - 950 cells/uL   Eosinophils Absolute 238 15 - 500 cells/uL   Basophils Absolute 43 0 - 200 cells/uL   Neutrophils Relative % 61.6 %   Total Lymphocyte 29.0 %   Monocytes Relative 5.5 %   Eosinophils Relative 3.3 %   Basophils Relative 0.6 %  COMPLETE METABOLIC PANEL WITH GFR  Result Value Ref Range   Glucose, Bld 140 (H) 65 - 99 mg/dL   BUN 8 7 - 25 mg/dL   Creat 0.71 0.50 - 0.96 mg/dL   eGFR 119 > OR = 60 mL/min/1.6m2   BUN/Creatinine  Ratio SEE NOTE: 6 - 22 (calc)   Sodium 140 135 - 146 mmol/L   Potassium 3.2 (L) 3.5 - 5.3 mmol/L   Chloride 100 98 - 110 mmol/L   CO2 27 20 - 32 mmol/L   Calcium 9.3 8.6 - 10.2 mg/dL   Total Protein 7.6 6.1 - 8.1 g/dL   Albumin 4.4 3.6 - 5.1 g/dL   Globulin 3.2 1.9 - 3.7 g/dL (calc)   AG Ratio 1.4 1.0 - 2.5 (calc)   Total Bilirubin 0.3 0.2 - 1.2 mg/dL   Alkaline phosphatase (APISO) 118 31 - 125 U/L   AST 32 (H) 10 - 30 U/L   ALT 38 (H) 6 - 29 U/L  Hemoglobin A1c  Result Value Ref Range   Hgb A1c MFr Bld 5.7 (H) <5.7 % of total Hgb   Mean Plasma Glucose 117 mg/dL   eAG (mmol/L) 6.5 mmol/L  B12 and Folate Panel  Result Value Ref Range   Vitamin B-12 339 200 - 1,100 pg/mL   Folate 16.8  ng/mL  VITAMIN D 25 Hydroxy (Vit-D Deficiency, Fractures)  Result Value Ref Range   Vit D, 25-Hydroxy 24 (L) 30 - 100 ng/mL  RPR  Result Value Ref Range   RPR Ser Ql NON-REACTIVE NON-REACTIVE  HIV Antibody (routine testing w rflx)  Result Value Ref Range   HIV 1&2 Ab, 4th Generation NON-REACTIVE NON-REACTIVE  POCT Influenza A/B  Result Value Ref Range   Influenza A, POC Negative Negative   Influenza B, POC Negative Negative      Assessment & Plan:   Problem List Items Addressed This Visit   None    Follow up plan: No follow-ups on file.

## 2022-07-09 NOTE — ED Triage Notes (Addendum)
Pt here POV for lower back pain radiates to right flank, painful urination and difficulty urination. Pt reports pain sharp to her flank area that comes in waves. Pt denies n/v. VSS, A&Ox4.

## 2022-07-09 NOTE — ED Provider Notes (Signed)
   [facility] Provider Note    Event Date/Time   First MD Initiated Contact with Patient 07/09/22 2030     (approximate)  History   Chief Complaint: Back Pain and Urinary Symptom  HPI  Lori Gross is a 27 y.o. female with a past medical history of hypertension, hyperlipidemia, presents to the emergency department for dysuria as well as some right-sided abdominal pain.  According to the patient for the past 2 or 3 days she has been experiencing painful urination when she describes as a burning sensation as well as intermittent pain in her right flank.  Denies any pain currently.  Denies any fever at any point.  Denies any nausea or vomiting or diarrhea.  No vaginal bleeding.  Physical Exam   Triage Vital Signs: ED Triage Vitals  Enc Vitals Group     BP 07/09/22 1922 (!) 130/93     Pulse Rate 07/09/22 1922 89     Resp 07/09/22 1922 17     Temp 07/09/22 1922 97.9 F (36.6 C)     Temp Source 07/09/22 1922 Oral     SpO2 07/09/22 1922 100 %     Weight 07/09/22 1923 230 lb (104.3 kg)     Height 07/09/22 1923 5\' 4"  (1.626 m)     Head Circumference --      Peak Flow --      Pain Score 07/09/22 1923 8     Pain Loc --      Pain Edu? --      Excl. in [place]? --     Most recent vital signs: Vitals:   07/09/22 1922  BP: (!) 130/93  Pulse: 89  Resp: 17  Temp: 97.9 F (36.6 C)  SpO2: 100%    General: Awake, no distress.  CV:  Good peripheral perfusion.  Regular rate and rhythm  Resp:  Normal effort.  Equal breath sounds bilaterally.  Abd:  No distention.  Soft, nontender.  No rebound or guarding.  Benign abdomen.  No suprapubic tenderness.  No right flank tenderness.   ED Results / Procedures / Treatments   RADIOLOGY  I have reviewed and interpreted the CT images.  No obvious ureterolithiasis seen on my evaluation. Radiology has read the CT scan as negative.   MEDICATIONS ORDERED IN ED: Medications - No data to display   IMPRESSION / MDM /  [place] / ED COURSE  I reviewed the triage vital signs and the nursing notes.  Patient's presentation is most consistent with acute presentation with potential threat to life or bodily function.  Patient presents emergency department for several days of dysuria now with intermittent right flank pain.  Differential would include UTI, pyelonephritis, ureterolithiasis.  Patient's labs have resulted showing a normal chemistry with normal renal function, negative pregnancy test, normal CBC besides a very slight leukocytosis 10,900.  Urinalysis does show greater than 50 white cells positive white blood cell clumps likely consistent with urinary tract infection.  CT scan is negative for any ureterolithiasis.  No sign of pyelonephritis.  Will start the patient on oral antibiotics as well as Pyridium have the patient follow-up with her doctor.  Discussed return precautions.  FINAL CLINICAL IMPRESSION(S) / ED DIAGNOSES   Flank pain Urinary tract infection  Rx / DC Orders   Keflex Pyridium  Note:  This document was prepared using Dragon voice recognition software and may include unintentional dictation errors.   Harvest Dark, MD 07/09/22 2104

## 2022-07-10 ENCOUNTER — Ambulatory Visit: Payer: Medicaid Other | Admitting: Nurse Practitioner

## 2022-07-20 DIAGNOSIS — Z419 Encounter for procedure for purposes other than remedying health state, unspecified: Secondary | ICD-10-CM | POA: Diagnosis not present

## 2022-07-22 ENCOUNTER — Ambulatory Visit: Payer: Medicaid Other | Admitting: Obstetrics and Gynecology

## 2022-08-12 ENCOUNTER — Other Ambulatory Visit (HOSPITAL_COMMUNITY)
Admission: RE | Admit: 2022-08-12 | Discharge: 2022-08-12 | Disposition: A | Discharge status: Home / Self Care | Payer: Medicaid Other | Source: Ambulatory Visit | Attending: Obstetrics and Gynecology | Admitting: Obstetrics and Gynecology

## 2022-08-12 ENCOUNTER — Encounter: Payer: Self-pay | Admitting: Obstetrics and Gynecology

## 2022-08-12 ENCOUNTER — Ambulatory Visit (INDEPENDENT_AMBULATORY_CARE_PROVIDER_SITE_OTHER): Payer: Medicaid Other | Admitting: Obstetrics and Gynecology

## 2022-08-12 VITALS — BP 136/86 | HR 71 | Ht 64.0 in | Wt 235.6 lb

## 2022-08-12 DIAGNOSIS — Z01419 Encounter for gynecological examination (general) (routine) without abnormal findings: Secondary | ICD-10-CM | POA: Diagnosis not present

## 2022-08-12 DIAGNOSIS — Z113 Encounter for screening for infections with a predominantly sexual mode of transmission: Secondary | ICD-10-CM | POA: Insufficient documentation

## 2022-08-12 DIAGNOSIS — Z124 Encounter for screening for malignant neoplasm of cervix: Secondary | ICD-10-CM

## 2022-08-12 DIAGNOSIS — Z3200 Encounter for pregnancy test, result unknown: Secondary | ICD-10-CM

## 2022-08-12 DIAGNOSIS — Z3202 Encounter for pregnancy test, result negative: Secondary | ICD-10-CM | POA: Diagnosis not present

## 2022-08-12 DIAGNOSIS — Z3009 Encounter for other general counseling and advice on contraception: Secondary | ICD-10-CM

## 2022-08-12 LAB — POCT URINE PREGNANCY: Preg Test, Ur: NEGATIVE

## 2022-08-12 MED ORDER — NORETHIN ACE-ETH ESTRAD-FE 1-20 MG-MCG(24) PO TABS: 1.0000 | ORAL_TABLET | Freq: Every day | ORAL | 3 refills | Status: DC

## 2022-08-12 NOTE — Progress Notes (Signed)
HPI:      [name] is a 27 y.o. [id number] who LMP was Patient's last menstrual period was 07/24/2022.  Subjective:   She presents today for her annual examination.  She states that her libido has decreased on OCPs.  She stopped the OCPs and found that it improved somewhat.  She has had unprotected intercourse during this time.  She is requesting a pregnancy test today.  She would like to restart OCPs but a different type. She is also requesting STD testing today.  She has no symptoms and has no concerns regarding her partner just "wants to be tested regularly".  Of significant note patient has had a history of ASCUS Pap smears with negative HPV.    Hx: The following portions of the patient's history were reviewed and updated as appropriate:             She  has a past medical history of Benign headache, Dysmenorrhea, Grieving, Hyperlipidemia due to dietary fat intake, Hypertension, Marijuana use (04/20/2014), Microalbuminuria, Migraine without aura and without status migrainosus, not intractable, Morbid obesity due to excess calories, Obesity, and Urticaria. She does not have any pertinent problems on file. She  has a past surgical history that includes No past surgeries and transthoracic echocardiogram (07/30/2021). Her family history includes Cancer (age of onset: 63) in her mother; Cancer (age of onset: 58) in her father; Cancer (age of onset: 24) in her paternal grandmother; Diabetes in her paternal grandfather and paternal grandmother; Hypertension in her brother, father, mother, and sister. She  reports that she has never smoked. She has never used smokeless tobacco. She reports that she does not drink alcohol and does not use drugs. She has a current medication list which includes the following prescription(s): vitamin d, b-12, norethindrone acetate-ethinyl estrad-fe, phenazopyridine, nurtec, triamterene-hydrochlorothiazide, and metoprolol succinate. She has No Known Allergies.        Review of Systems:  Review of Systems  Constitutional: Denied constitutional symptoms, night sweats, recent illness, fatigue, fever, insomnia and weight loss.  Eyes: Denied eye symptoms, eye pain, photophobia, vision change and visual disturbance.  Ears/Nose/Throat/Neck: Denied ear, nose, throat or neck symptoms, hearing loss, nasal discharge, sinus congestion and sore throat.  Cardiovascular: Denied cardiovascular symptoms, arrhythmia, chest pain/pressure, edema, exercise intolerance, orthopnea and palpitations.  Respiratory: Denied pulmonary symptoms, asthma, pleuritic pain, productive sputum, cough, dyspnea and wheezing.  Gastrointestinal: Denied, gastro-esophageal reflux, melena, nausea and vomiting.  Genitourinary: Denied genitourinary symptoms including symptomatic vaginal discharge, pelvic relaxation issues, and urinary complaints.  Musculoskeletal: Denied musculoskeletal symptoms, stiffness, swelling, muscle weakness and myalgia.  Dermatologic: Denied dermatology symptoms, rash and scar.  Neurologic: Denied neurology symptoms, dizziness, headache, neck pain and syncope.  Psychiatric: Denied psychiatric symptoms, anxiety and depression.  Endocrine: Denied endocrine symptoms including hot flashes and night sweats.   Meds:   Current Outpatient Medications on File Prior to Visit  Medication Sig Dispense Refill   Cholecalciferol (VITAMIN D) 50 MCG (2000 UT) CAPS Take 1 capsule by mouth daily.     Cyanocobalamin (B-12) 1000 MCG SUBL Place 1 tablet under the tongue daily. 100 tablet 1   phenazopyridine (PYRIDIUM) 200 MG tablet Take 1 tablet (200 mg total) by mouth 3 (three) times daily as needed for pain. 20 tablet 0   Rimegepant Sulfate (NURTEC) 75 MG TBDP Take 75 mg by mouth as needed (One tablet (75mg ) by mouth daily as needed). 8 tablet 3   triamterene-hydrochlorothiazide (MAXZIDE-25) 37.5-25 MG tablet Take 1 tablet by mouth daily. 90  tablet 1   metoprolol succinate (TOPROL-XL) 50  MG 24 hr tablet Take 1 tablet (50 mg total) by mouth daily. Take with or immediately following a meal. 90 tablet 3   No current facility-administered medications on file prior to visit.     Objective:     Vitals:   08/12/22 0823  BP: 136/86  Pulse: 71    Filed Weights   08/12/22 0823  Weight: 235 lb 9.6 oz (106.9 kg)              Physical examination General NAD, Conversant  HEENT Atraumatic; Op clear with mmm.  Normo-cephalic. Pupils reactive. Anicteric sclerae  Thyroid/Neck Smooth without nodularity or enlargement. Normal ROM.  Neck Supple.  Skin No rashes, lesions or ulceration. Normal palpated skin turgor. No nodularity.  Breasts: No masses or discharge.  Symmetric.  No axillary adenopathy.  Lungs: Clear to auscultation.No rales or wheezes. Normal Respiratory effort, no retractions.  Heart: NSR.  No murmurs or rubs appreciated. No peripheral edema  Abdomen: Soft.  Non-tender.  No masses.  No HSM. No hernia  Extremities: Moves all appropriately.  Normal ROM for age. No lymphadenopathy.  Neuro: Oriented to PPT.  Normal mood. Normal affect.     Pelvic:   Vulva: Normal appearance.  No lesions.  Vagina: No lesions or abnormalities noted.  Support: Normal pelvic support.  Urethra No masses tenderness or scarring.  Meatus Normal size without lesions or prolapse.  Cervix: Normal appearance.  No lesions.  Anus: Normal exam.  No lesions.  Perineum: Normal exam.  No lesions.        Bimanual   Uterus: Normal size.  Non-tender.  Mobile.  AV.  Adnexae: No masses.  Non-tender to palpation.  Cul-de-sac: Negative for abnormality.     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 Well woman exam with routine gynecological exam   2. Cervical cancer screening   3. Birth control counseling   4. Possible pregnancy, not yet confirmed   5. Screening for STD (sexually transmitted disease)     Negative pregnancy test  Patient would like to start OCPs   Plan:            1.  Basic Screening Recommendations The basic screening recommendations for asymptomatic women were discussed with the patient during her visit.  The age-appropriate recommendations were discussed with her and the rational for the tests reviewed.  When I am informed by the patient that another primary care physician has previously obtained the age-appropriate tests and they are up-to-date, only outstanding tests are ordered and referrals given as necessary.  Abnormal results of tests will be discussed with her when all of her results are completed.  Routine preventative health maintenance measures emphasized: Exercise/Diet/Weight control, Tobacco Warnings, Alcohol/Substance use risks and Stress Management Pap Co-test performed  2.  Nuswab performed at patient request (asymptomatic) 3.  HIV HSV hepatitis and RPR  Orders Orders Placed This Encounter  Procedures   POCT  urine pregnancy     Meds ordered this encounter  Medications   Norethindrone Acetate-Ethinyl Estrad-FE (LOESTRIN 24 FE) 1-20 MG-MCG(24) tablet    Sig: Take 1 tablet by mouth at bedtime.    Dispense:  84 tablet    Refill:  3          F/U  Return in about 1 year (around 08/12/2023) for Annual Physical.  [name], M.D. 08/12/2022 8:57 AM

## 2022-08-12 NOTE — Progress Notes (Signed)
Patients presents for annual exam today. She states stopping her OCP one month ago due to "feeling off" but is continuing to have regular cycles. Patient would like to discuss other birth control options at this time. Patient is due for pap smear, ordered. She states no other questions or concerns at this time.

## 2022-08-13 LAB — CERVICOVAGINAL ANCILLARY ONLY
Bacterial Vaginitis (gardnerella): NEGATIVE
Candida Glabrata: NEGATIVE
Candida Vaginitis: NEGATIVE
Chlamydia: NEGATIVE
Comment: NEGATIVE
Comment: NEGATIVE
Comment: NEGATIVE
Comment: NEGATIVE
Comment: NEGATIVE
Comment: NORMAL
Neisseria Gonorrhea: NEGATIVE
Trichomonas: NEGATIVE

## 2022-08-13 LAB — HIV ANTIBODY (ROUTINE TESTING W REFLEX): HIV Screen 4th Generation wRfx: NONREACTIVE

## 2022-08-13 LAB — RPR: RPR Ser Ql: NONREACTIVE

## 2022-08-13 LAB — HEPATITIS C ANTIBODY: Hep C Virus Ab: NONREACTIVE

## 2022-08-13 LAB — HEPATITIS B SURFACE ANTIGEN: Hepatitis B Surface Ag: NEGATIVE

## 2022-08-16 ENCOUNTER — Other Ambulatory Visit: Payer: Self-pay | Admitting: Obstetrics and Gynecology

## 2022-08-16 DIAGNOSIS — Z3041 Encounter for surveillance of contraceptive pills: Secondary | ICD-10-CM

## 2022-08-18 LAB — CYTOLOGY - PAP
Comment: NEGATIVE
High risk HPV: NEGATIVE

## 2022-08-19 DIAGNOSIS — Z419 Encounter for procedure for purposes other than remedying health state, unspecified: Secondary | ICD-10-CM | POA: Diagnosis not present

## 2022-09-19 DIAGNOSIS — Z419 Encounter for procedure for purposes other than remedying health state, unspecified: Secondary | ICD-10-CM | POA: Diagnosis not present

## 2022-10-19 DIAGNOSIS — Z419 Encounter for procedure for purposes other than remedying health state, unspecified: Secondary | ICD-10-CM | POA: Diagnosis not present

## 2022-11-13 ENCOUNTER — Ambulatory Visit: Payer: Medicaid Other | Admitting: Family Medicine

## 2022-11-19 DIAGNOSIS — Z419 Encounter for procedure for purposes other than remedying health state, unspecified: Secondary | ICD-10-CM | POA: Diagnosis not present

## 2022-12-15 NOTE — Progress Notes (Unsigned)
Name: Lori Gross   MRN: [id number]    DOB: [birth year]   Date:12/16/2022       Progress Note  Subjective  Chief Complaint  Follow up  HPI  HTN: BP is at goal, off norvasc ( stopped by cardiologist) and taking metoprolol and triamterene hctz. No side effects, no longer has palpitation, denies orthostatic changed , however potassium has been low and feeling tired, we will try switching to spironolactone hydrochlorothiazide 25/25     Migraine : she states migraine was associated with photophobia and phonophobia, throbbing like and usually on right side of her head. She states no episodes  in a long time  She still has Nurtec at home   Obesity: she lost of weight when she stopped drinking sodas 2021 ,she works at [employer] full time she also has two small children at home. She is always moving,  She had lost about 60 pounds, weight August 2021 was down to 172 lbs but she states since Thanksgiving 2021 she has not been compliant with a healthy diet and was gradually gaining weight but now is down 5 lbs since last visit    Abnormal pap: seen by gyn, last pap LGIL and will have a repeat study in 6 months and she said already has an appointment    B12 and Vitamin D; she has been taking supplements as recommended , we will recheck labs   Palpitation:she is avoiding caffeine and is taking higher dose metoprolol since May 2023  and palpitation resolved. Unchanged   Dyslipidemia: discussed life style modification. 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 Normal LV size & fxn. EF 55-60%. No RWMA. Gr 1 DD? But normal LA size. Normal Valves. Normal RV, RVSP & RAP.    Family History  Problem Relation Age of Onset   Hypertension Mother    Cancer Mother 43       Common Bile Duct   Hypertension Sister    Hypertension Brother    Diabetes Paternal Grandmother    Cancer Paternal Grandmother 68       brain cancer   Diabetes Paternal Grandfather    Hypertension Father    Cancer Father 87       kidney cancer    Social History   Tobacco Use   Smoking status: Never   Smokeless tobacco: Never  Substance Use Topics   Alcohol use: No    Alcohol/week: 0.0 standard drinks of alcohol     Current Outpatient Medications:    Cholecalciferol (VITAMIN D) 50 MCG (2000 UT) CAPS, Take 1 capsule by mouth daily., Disp: , Rfl:    Cyanocobalamin (B-12) 1000 MCG SUBL, Place 1 tablet under the tongue daily., Disp: 100 tablet, Rfl: 1   Rimegepant Sulfate (NURTEC) 75 MG TBDP, Take 75 mg by mouth as needed (One  tablet (75mg ) by mouth daily as needed)., Disp: 8 tablet, Rfl: 3   spironolactone-hydrochlorothiazide (ALDACTAZIDE) 25-25 MG tablet, Take 1 tablet by mouth daily., Disp: 90 tablet, Rfl: 1   metoprolol succinate (TOPROL-XL) 50 MG 24 hr tablet, Take 1 tablet (50 mg total) by mouth daily. Take with or immediately following a meal., Disp: 90 tablet, Rfl: 3  No Known Allergies  I personally reviewed active problem list, medication list, allergies, family history, social history with the patient/caregiver today.   ROS  Constitutional: Negative for fever or weight change.  Respiratory: Negative for cough and shortness of breath.   Cardiovascular: Negative for chest pain or palpitations.  Gastrointestinal: Negative for  abdominal pain, no bowel changes.  Musculoskeletal: Negative for gait problem or joint swelling.  Skin: Negative for rash.  Neurological: Negative for dizziness or headache.  No other specific complaints in a complete review of systems (except as listed in HPI above).   Objective  Vitals:   12/16/22 1148  BP: 132/84  Pulse: 94  Resp: 16  Temp: (!) 97.5 F (36.4 C)  TempSrc: Oral  SpO2: 99%  Weight: 228 lb 3.2 oz (103.5 kg)  Height: 5\' 4"  (1.626 m)    Body mass index is 39.17 kg/m.  Physical Exam  Constitutional: Patient appears well-developed and well-nourished. Obese No distress.  HEENT: head atraumatic, normocephalic, pupils equal and reactive to light, neck supple, throat within normal limits Cardiovascular: Normal rate, regular rhythm and normal heart sounds.  No murmur heard. No BLE edema. Pulmonary/Chest: Effort normal and breath sounds normal. No respiratory distress. Abdominal: Soft.  There is no tenderness. Psychiatric: Patient has a normal mood and affect. behavior is normal. Judgment and thought content normal.    PHQ2/9:    12/16/2022   11:50 AM 05/15/2022    2:37 PM 11/14/2021    2:06 PM 06/10/2021   10:53 AM 05/20/2021    2:47 PM  Depression screen PHQ 2/9  Decreased Interest 0 0 0 0 0  Down, Depressed, Hopeless 0 0 0 0 0  PHQ - 2 Score 0 0 0 0 0  Altered sleeping 0 0 0 0 0  Tired, decreased energy 0 0 0 0 0  Change in appetite 0 0 0 0 0  Feeling bad or failure about yourself  0 0 0 0 0  Trouble concentrating 0 0 0 0 0  Moving slowly or fidgety/restless 0 0 0 0 0  Suicidal thoughts 0 0 0 0 0  PHQ-9 Score 0 0 0 0 0  Difficult doing work/chores   Not difficult at all Not difficult at all Not difficult at all    phq 9 is negative   Fall Risk:    12/16/2022   11:50 AM 05/15/2022    2:36 PM 11/14/2021    2:06 PM 06/10/2021   10:52 AM 05/20/2021    2:47 PM  Fall Risk   Falls in the past year? 0 0 0 0 0  Number falls in past yr:   0 0 0  Injury with  Fall?   0 0 0  Risk for fall due to : No Fall Risks No Fall Risks No Fall Risks No Fall Risks No Fall Risks  Follow up Falls prevention discussed Falls prevention discussed Falls prevention discussed;Education provided Falls prevention discussed Falls prevention discussed    Functional Status Survey: Is the patient deaf or have difficulty hearing?: No Does the patient have difficulty seeing, even when wearing glasses/contacts?: No Does the patient have  difficulty concentrating, remembering, or making decisions?: No Does the patient have difficulty walking or climbing stairs?: No Does the patient have difficulty dressing or bathing?: No Does the patient have difficulty doing errands alone such as visiting a doctor's office or shopping?: No    Assessment & Plan  1. Morbid obesity (HCC)  Discussed with the patient the risk posed by an increased BMI. Discussed importance of portion control, calorie counting and at least 150 minutes of physical activity weekly. Avoid sweet beverages and drink more water. Eat at least 6 servings of fruit and vegetables daily    2. Hypertension, benign  - COMPLETE METABOLIC PANEL WITH GFR - CBC with Differential/Platelet - metoprolol succinate (TOPROL-XL) 50 MG 24 hr tablet; Take 1 tablet (50 mg total) by mouth daily. Take with or immediately following a meal.  Dispense: 90 tablet; Refill: 3 - spironolactone-hydrochlorothiazide (ALDACTAZIDE) 25-25 MG tablet; Take 1 tablet by mouth daily.  Dispense: 90 tablet; Refill: 1  3. Vitamin D deficiency  - VITAMIN D 25 Hydroxy (Vit-D Deficiency, Fractures)  4. B12 deficiency  - B12 and Folate Panel  5. Dyslipidemia  - Lipid panel  6. Migraine without aura and responsive to treatment  No recent episodes   7. Routine screening for STI (sexually transmitted infection)  - RPR - Cervicovaginal ancillary only - HIV antibody (with reflex)  8. History of iron deficiency anemia  - CBC with  Differential/Platelet  9. Prediabetes  - Hemoglobin A1c

## 2022-12-16 ENCOUNTER — Other Ambulatory Visit (HOSPITAL_COMMUNITY)
Admission: RE | Admit: 2022-12-16 | Discharge: 2022-12-16 | Disposition: A | Discharge status: Home / Self Care | Payer: Medicaid Other | Source: Ambulatory Visit

## 2022-12-16 ENCOUNTER — Ambulatory Visit (INDEPENDENT_AMBULATORY_CARE_PROVIDER_SITE_OTHER): Payer: Medicaid Other | Admitting: Family Medicine

## 2022-12-16 ENCOUNTER — Encounter: Payer: Self-pay | Admitting: Family Medicine

## 2022-12-16 DIAGNOSIS — I10 Essential (primary) hypertension: Secondary | ICD-10-CM

## 2022-12-16 DIAGNOSIS — Z113 Encounter for screening for infections with a predominantly sexual mode of transmission: Secondary | ICD-10-CM | POA: Diagnosis not present

## 2022-12-16 DIAGNOSIS — E785 Hyperlipidemia, unspecified: Secondary | ICD-10-CM

## 2022-12-16 DIAGNOSIS — G43009 Migraine without aura, not intractable, without status migrainosus: Secondary | ICD-10-CM

## 2022-12-16 DIAGNOSIS — R7303 Prediabetes: Secondary | ICD-10-CM

## 2022-12-16 DIAGNOSIS — Z862 Personal history of diseases of the blood and blood-forming organs and certain disorders involving the immune mechanism: Secondary | ICD-10-CM | POA: Diagnosis not present

## 2022-12-16 DIAGNOSIS — E559 Vitamin D deficiency, unspecified: Secondary | ICD-10-CM

## 2022-12-16 DIAGNOSIS — E538 Deficiency of other specified B group vitamins: Secondary | ICD-10-CM | POA: Diagnosis not present

## 2022-12-16 MED ORDER — SPIRONOLACTONE-HCTZ 25-25 MG PO TABS: 1.0000 | ORAL_TABLET | Freq: Every day | ORAL | 1 refills | Status: DC

## 2022-12-16 MED ORDER — METOPROLOL SUCCINATE ER 50 MG PO TB24: 50.0000 mg | ORAL_TABLET | Freq: Every day | ORAL | 3 refills | Status: DC

## 2022-12-18 LAB — HEMOGLOBIN A1C
Hgb A1c MFr Bld: 5.5 %{Hb} (ref <5.7)
Mean Plasma Glucose: 111 mg/dL
eAG (mmol/L): 6.2 mmol/L

## 2022-12-18 LAB — COMPLETE METABOLIC PANEL WITH GFR
AG Ratio: 1.3 (calc) (ref 1.0–2.5)
ALT: 48 U/L — ABNORMAL HIGH (ref 6–29)
AST: 33 U/L — ABNORMAL HIGH (ref 10–30)
Albumin: 4.3 g/dL (ref 3.6–5.1)
Alkaline phosphatase (APISO): 97 U/L (ref 31–125)
BUN: 10 mg/dL (ref 7–25)
CO2: 29 mmol/L (ref 20–32)
Calcium: 9.7 mg/dL (ref 8.6–10.2)
Chloride: 97 mmol/L — ABNORMAL LOW (ref 98–110)
Creat: 0.86 mg/dL (ref 0.50–0.96)
Globulin: 3.2 g/dL (calc) (ref 1.9–3.7)
Glucose, Bld: 102 mg/dL — ABNORMAL HIGH (ref 65–99)
Potassium: 3.8 mmol/L (ref 3.5–5.3)
Sodium: 138 mmol/L (ref 135–146)
Total Bilirubin: 0.6 mg/dL (ref 0.2–1.2)
Total Protein: 7.5 g/dL (ref 6.1–8.1)
eGFR: 94 mL/min/{1.73_m2} (ref >=60)

## 2022-12-18 LAB — LIPID PANEL
Cholesterol: 246 mg/dL — ABNORMAL HIGH (ref <200)
HDL: 56 mg/dL (ref >=50)
LDL Cholesterol (Calc): 144 mg/dL — ABNORMAL HIGH
Non-HDL Cholesterol (Calc): 190 mg/dL — ABNORMAL HIGH (ref <130)
Total CHOL/HDL Ratio: 4.4 (calc) (ref <5.0)
Triglycerides: 281 mg/dL — ABNORMAL HIGH (ref <150)

## 2022-12-18 LAB — CBC WITH DIFFERENTIAL/PLATELET
Absolute Monocytes: 330 {cells}/uL (ref 200–950)
Basophils Absolute: 39 {cells}/uL (ref 0–200)
Basophils Relative: 0.4 %
Eosinophils Absolute: 204 {cells}/uL (ref 15–500)
Eosinophils Relative: 2.1 %
HCT: 38.1 % (ref 35.0–45.0)
Hemoglobin: 13.3 g/dL (ref 11.7–15.5)
Lymphs Abs: 2173 {cells}/uL (ref 850–3900)
MCH: 34.2 pg — ABNORMAL HIGH (ref 27.0–33.0)
MCHC: 34.9 g/dL (ref 32.0–36.0)
MCV: 97.9 fL (ref 80.0–100.0)
MPV: 10.4 fL (ref 7.5–12.5)
Monocytes Relative: 3.4 %
Neutro Abs: 6955 {cells}/uL (ref 1500–7800)
Neutrophils Relative %: 71.7 %
Platelets: 289 10*3/uL (ref 140–400)
RBC: 3.89 10*6/uL (ref 3.80–5.10)
RDW: 14.6 % (ref 11.0–15.0)
Total Lymphocyte: 22.4 %
WBC: 9.7 10*3/uL (ref 3.8–10.8)

## 2022-12-18 LAB — B12 AND FOLATE PANEL
Folate: 9.6 ng/mL
Vitamin B-12: 332 pg/mL (ref 200–1100)

## 2022-12-18 LAB — RPR: RPR Ser Ql: NONREACTIVE

## 2022-12-18 LAB — HIV ANTIBODY (ROUTINE TESTING W REFLEX): HIV 1&2 Ab, 4th Generation: NONREACTIVE

## 2022-12-18 LAB — VITAMIN D 25 HYDROXY (VIT D DEFICIENCY, FRACTURES): Vit D, 25-Hydroxy: 21 ng/mL — ABNORMAL LOW (ref 30–100)

## 2022-12-20 DIAGNOSIS — Z419 Encounter for procedure for purposes other than remedying health state, unspecified: Secondary | ICD-10-CM | POA: Diagnosis not present

## 2022-12-24 LAB — CERVICOVAGINAL ANCILLARY ONLY
Chlamydia: NEGATIVE
Comment: NEGATIVE
Comment: NORMAL
Neisseria Gonorrhea: NEGATIVE

## 2023-01-19 DIAGNOSIS — Z419 Encounter for procedure for purposes other than remedying health state, unspecified: Secondary | ICD-10-CM | POA: Diagnosis not present

## 2023-02-19 DIAGNOSIS — Z419 Encounter for procedure for purposes other than remedying health state, unspecified: Secondary | ICD-10-CM | POA: Diagnosis not present

## 2023-03-10 ENCOUNTER — Encounter: Payer: Self-pay | Admitting: Obstetrics and Gynecology

## 2023-03-10 ENCOUNTER — Other Ambulatory Visit (HOSPITAL_COMMUNITY)
Admission: RE | Admit: 2023-03-10 | Discharge: 2023-03-10 | Disposition: A | Discharge status: Home / Self Care | Payer: Medicaid Other | Source: Ambulatory Visit | Attending: Obstetrics and Gynecology | Admitting: Obstetrics and Gynecology

## 2023-03-10 ENCOUNTER — Ambulatory Visit: Payer: Medicaid Other | Admitting: Obstetrics and Gynecology

## 2023-03-10 VITALS — BP 126/85 | HR 76 | Ht 64.0 in | Wt 217.6 lb

## 2023-03-10 DIAGNOSIS — Z124 Encounter for screening for malignant neoplasm of cervix: Secondary | ICD-10-CM | POA: Diagnosis not present

## 2023-03-10 DIAGNOSIS — R87612 Low grade squamous intraepithelial lesion on cytologic smear of cervix (LGSIL): Secondary | ICD-10-CM

## 2023-03-10 NOTE — Progress Notes (Signed)
Patient presents today for a 6 month repeat pap smear.

## 2023-03-10 NOTE — Progress Notes (Signed)
HPI:      [name] is a 27 y.o. [id number] who LMP was Patient's last menstrual period was 03/05/2023.  Subjective:   She presents today because she has had abnormal Paps in the past showing cellular changes without positive HPV.  She is scheduled for a Pap today.    Hx: The following portions of the patient's history were reviewed and updated as appropriate:             She  has a past medical history of Benign headache, Dysmenorrhea, Grieving, Hyperlipidemia due to dietary fat intake, Hypertension, Marijuana use (04/20/2014), Microalbuminuria, Migraine without aura and without status migrainosus, not intractable, Morbid obesity due to excess calories (HCC), Obesity, and Urticaria. She does not have any pertinent problems on file. She  has a past surgical history that includes No past surgeries and transthoracic echocardiogram (07/30/2021). Her family history includes Cancer (age of onset: 76) in her mother; Cancer (age of onset: 91) in her father; Cancer (age of onset: 46) in her paternal grandmother; Diabetes in her paternal grandfather and paternal grandmother; Hypertension in her brother, father, mother, and sister. She  reports that she has never smoked. She has never used smokeless tobacco. She reports that she does not drink alcohol and does not use drugs. She has a current medication list which includes the following prescription(s): vitamin d, b-12, metoprolol succinate, nurtec, and spironolactone-hydrochlorothiazide. She has No Known Allergies.       Review of Systems:  Review of Systems  Constitutional: Denied constitutional symptoms, night sweats, recent illness, fatigue, fever, insomnia and weight loss.  Eyes: Denied eye symptoms, eye pain, photophobia, vision change and visual disturbance.  Ears/Nose/Throat/Neck: Denied ear, nose, throat or neck symptoms, hearing loss, nasal discharge, sinus congestion and sore throat.  Cardiovascular: Denied cardiovascular symptoms,  arrhythmia, chest pain/pressure, edema, exercise intolerance, orthopnea and palpitations.  Respiratory: Denied pulmonary symptoms, asthma, pleuritic pain, productive sputum, cough, dyspnea and wheezing.  Gastrointestinal: Denied, gastro-esophageal reflux, melena, nausea and vomiting.  Genitourinary: Denied genitourinary symptoms including symptomatic vaginal discharge, pelvic relaxation issues, and urinary complaints.  Musculoskeletal: Denied musculoskeletal symptoms, stiffness, swelling, muscle weakness and myalgia.  Dermatologic: Denied dermatology symptoms, rash and scar.  Neurologic: Denied neurology symptoms, dizziness, headache, neck pain and syncope.  Psychiatric: Denied psychiatric symptoms, anxiety and depression.  Endocrine: Denied endocrine symptoms including hot flashes and night sweats.   Meds:   Current Outpatient Medications on File Prior to Visit  Medication Sig Dispense Refill   Cholecalciferol (VITAMIN D) 50 MCG (2000 UT) CAPS Take 1 capsule by mouth daily.     Cyanocobalamin (B-12) 1000 MCG SUBL Place 1 tablet under the tongue daily. 100 tablet 1   metoprolol succinate (TOPROL-XL) 50 MG 24 hr tablet Take 1 tablet (50 mg total) by mouth daily. Take with or immediately following a meal. 90 tablet 3   Rimegepant Sulfate (NURTEC) 75 MG TBDP Take 75 mg by mouth as needed (One tablet (75mg ) by mouth daily as needed). 8 tablet 3   spironolactone-hydrochlorothiazide (ALDACTAZIDE) 25-25 MG tablet Take 1 tablet by mouth daily. 90 tablet 1   No current facility-administered medications on file prior to visit.      Objective:     Vitals:   03/10/23 1002 03/10/23 1015  BP: (!) 121/90 126/85  Pulse: 76    Filed Weights   03/10/23 1002  Weight: 217 lb 9.6 oz (98.7 kg)              Physical examination  Pelvic:   Vulva: Normal appearance.  No lesions.  Vagina: No lesions or abnormalities noted.  Support: Normal pelvic support.  Urethra No masses tenderness or scarring.   Meatus Normal size without lesions or prolapse.  Cervix: Normal appearance.  No lesions.  Anus: Normal exam.  No lesions.  Perineum: Normal exam.  No lesions.        Bimanual   Uterus: Normal size.  Non-tender.  Mobile.  AV.  Adnexae: No masses.  Non-tender to palpation.  Cul-de-sac: Negative for abnormality.             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 Cervical cancer screening   2. LGSIL on Pap smear of cervix     History of abnormal cellularity with negative HPV.   Plan:            1.  Pap Co-test performed.  If cells remain LGSIL proceed to colposcopy regardless of HPV findings.  Orders No orders of the defined types were placed in this encounter.   No orders of the defined types were placed in this encounter.     F/U  Return for We will contact her with any abnormal test results.  [name], M.D. 03/10/2023 10:42 AM

## 2023-03-15 LAB — CYTOLOGY - PAP
Comment: NEGATIVE
Comment: NEGATIVE
Comment: NEGATIVE
HPV 16: NEGATIVE
HPV 18 / 45: NEGATIVE
High risk HPV: POSITIVE — AB

## 2023-03-16 ENCOUNTER — Telehealth: Payer: Self-pay | Admitting: Obstetrics and Gynecology

## 2023-03-16 NOTE — Telephone Encounter (Signed)
The patient contacted the office and scheduled for 12/18 with Dr Logan Bores

## 2023-03-16 NOTE — Telephone Encounter (Addendum)
I contacted the patient vis phone, I left message advising the patient to call back to scheduled. The patient needs Colpo with Dr Logan Bores. Was going to offer 12/10 at 2:15 pm

## 2023-03-17 NOTE — Progress Notes (Unsigned)
Name: Lori Gross   MRN: [id number]    DOB: 1995-10-16   Date:03/17/2023       Progress Note  Subjective  Chief Complaint  No chief complaint on file.   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 Normal LV size & fxn. EF 55-60%. No RWMA. Gr 1 DD? But normal LA size. Normal Valves. Normal RV, RVSP & RAP.    Family History  Problem Relation Age of Onset   Hypertension Mother    Cancer Mother 28       Common Bile Duct   Hypertension Sister    Hypertension Brother    Diabetes Paternal Grandmother    Cancer Paternal Grandmother 36       brain cancer   Diabetes Paternal Grandfather    Hypertension Father    Cancer Father 1       kidney cancer    Social History   Tobacco Use   Smoking status: Never   Smokeless tobacco: Never  Substance Use Topics   Alcohol use: No    Alcohol/week: 0.0 standard drinks of alcohol     Current Outpatient Medications:    Cholecalciferol (VITAMIN D) 50 MCG (2000 UT) CAPS, Take 1 capsule by mouth daily., Disp: , Rfl:    Cyanocobalamin (B-12) 1000 MCG SUBL, Place 1 tablet under the tongue daily., Disp: 100 tablet, Rfl: 1   metoprolol succinate (TOPROL-XL) 50 MG 24 hr tablet, Take 1 tablet (50 mg total) by mouth daily. Take with or immediately following a meal., Disp: 90 tablet, Rfl: 3    Rimegepant Sulfate (NURTEC) 75 MG TBDP, Take 75 mg by mouth as needed (One tablet (75mg ) by mouth daily as needed)., Disp: 8 tablet, Rfl: 3   spironolactone-hydrochlorothiazide (ALDACTAZIDE) 25-25 MG tablet, Take 1 tablet by mouth daily., Disp: 90 tablet, Rfl: 1  No Known Allergies  I personally reviewed {Reviewed:14835} with the patient/caregiver today.   ROS  ***  Objective  There were no vitals filed for this visit.  There is no height or weight on file to calculate BMI.  Physical Exam ***  Recent Results (from the past 2160 hour(s))  Cytology - PAP     Status: Abnormal   Collection Time: 03/10/23  9:50 AM  Result Value Ref Range   High risk HPV Positive (A)    HPV 16 Negative    HPV 18 / 45 Negative    Adequacy      Satisfactory for evaluation; transformation zone component PRESENT.   Diagnosis - Low grade squamous intraepithelial lesion (LSIL) (A)    Comment Normal Reference Range HPV - Negative    Comment Normal Reference Range HPV 16- Negative    Comment Normal Reference Range HPV 16 18 45 -Negative     Diabetic Foot Exam: Diabetic Foot Exam - Simple   No data filed    ***  PHQ2/9:    12/16/2022   11:50 AM 05/15/2022    2:37 PM 11/14/2021    2:06 PM 06/10/2021   10:53 AM 05/20/2021    2:47 PM  Depression screen PHQ 2/9  Decreased Interest 0 0 0 0 0  Down, Depressed, Hopeless 0 0 0 0 0  PHQ - 2 Score 0 0 0 0 0  Altered sleeping 0 0 0 0 0  Tired, decreased energy 0 0 0 0 0  Change in appetite 0 0 0 0 0  Feeling bad or failure about yourself  0 0 0 0 0  Trouble concentrating 0 0 0 0 0  Moving slowly or fidgety/restless 0 0 0 0 0  Suicidal thoughts 0 0 0 0 0  PHQ-9 Score 0 0 0 0 0  Difficult doing work/chores   Not difficult at all Not difficult at all Not difficult at all    phq 9 is {gen pos [id number]} ***  Fall Risk:    12/16/2022   11:50 AM 05/15/2022    2:36 PM 11/14/2021    2:06 PM 06/10/2021   10:52 AM 05/20/2021    2:47 PM  Fall Risk    Falls in the past year? 0 0 0 0 0  Number falls in past yr:   0 0 0  Injury with Fall?   0 0 0  Risk for fall due to : No Fall Risks No Fall Risks No Fall Risks No Fall Risks No Fall Risks  Follow up Falls prevention discussed Falls prevention discussed Falls prevention discussed;Education provided Falls prevention discussed Falls prevention discussed   ***   Functional Status Survey:   ***   Assessment & Plan  *** There are no diagnoses linked to this encounter.

## 2023-03-21 DIAGNOSIS — Z419 Encounter for procedure for purposes other than remedying health state, unspecified: Secondary | ICD-10-CM | POA: Diagnosis not present

## 2023-03-24 ENCOUNTER — Ambulatory Visit: Payer: Medicaid Other | Admitting: Family Medicine

## 2023-03-24 DIAGNOSIS — Z23 Encounter for immunization: Secondary | ICD-10-CM

## 2023-04-07 ENCOUNTER — Other Ambulatory Visit (HOSPITAL_COMMUNITY)
Admission: RE | Admit: 2023-04-07 | Discharge: 2023-04-07 | Disposition: A | Discharge status: Home / Self Care | Payer: Medicaid Other | Source: Ambulatory Visit | Attending: Obstetrics and Gynecology | Admitting: Obstetrics and Gynecology

## 2023-04-07 ENCOUNTER — Ambulatory Visit: Payer: Medicaid Other | Admitting: Obstetrics and Gynecology

## 2023-04-07 ENCOUNTER — Encounter: Payer: Self-pay | Admitting: Obstetrics and Gynecology

## 2023-04-07 VITALS — BP 132/100 | HR 66 | Ht 64.0 in | Wt 213.9 lb

## 2023-04-07 DIAGNOSIS — R87612 Low grade squamous intraepithelial lesion on cytologic smear of cervix (LGSIL): Secondary | ICD-10-CM | POA: Insufficient documentation

## 2023-04-07 DIAGNOSIS — B977 Papillomavirus as the cause of diseases classified elsewhere: Secondary | ICD-10-CM | POA: Insufficient documentation

## 2023-04-07 DIAGNOSIS — Z3202 Encounter for pregnancy test, result negative: Secondary | ICD-10-CM | POA: Diagnosis not present

## 2023-04-07 DIAGNOSIS — N870 Mild cervical dysplasia: Secondary | ICD-10-CM | POA: Diagnosis not present

## 2023-04-07 LAB — POCT URINE PREGNANCY: Preg Test, Ur: NEGATIVE

## 2023-04-07 NOTE — Progress Notes (Signed)
Patient presents today for a colposcopy. She recently had an abnormal pap smear resulting in lgsil/HPV+ . No further concerns today.

## 2023-04-07 NOTE — Progress Notes (Signed)
HPI:  Lori Gross is a 27 y.o.  [phone]  who presents today for evaluation and management of abnormal cervical cytology.    Dysplasia History: She has a history of mildly abnormal Pap smears and negative HPV's. Her most recent Pap smear was LGSIL  HPV: Positive  (negative for 16 and 18)  ROS:  Pertinent items noted in HPI and remainder of comprehensive ROS otherwise negative.  OB History  Gravida Para Term Preterm AB Living  2 2 2  0 0 2  SAB IAB Ectopic Multiple Live Births  0  0 0 2    # Outcome Date GA Lbr Len/2nd Weight Sex Type Anes PTL Lv  2 Term 02/26/17 [redacted]w[redacted]d / 00:17 8 lb 3.2 oz (3.72 kg) M Vag-Spont EPI  LIV  1 Term 11/08/15 [redacted]w[redacted]d / 01:44 6 lb 13 oz (3.09 kg) F Vag-Spont EPI  LIV     Complications: Chronic hypertension affecting pregnancy    Obstetric Comments  Menstrual age: 4    Age 1st Pregnancy: 56    Past Medical History:  Diagnosis Date   Benign headache    Dysmenorrhea    Grieving    Hyperlipidemia due to dietary fat intake    Hypertension    Marijuana use 04/20/2014   positive UDS   Microalbuminuria    Migraine without aura and without status migrainosus, not intractable    Morbid obesity due to excess calories (HCC)    BMI 37-38 with HTN & HLD   Obesity    Urticaria     Past Surgical History:  Procedure Laterality Date   NO PAST SURGERIES     TRANSTHORACIC ECHOCARDIOGRAM  07/30/2021   HR 100 bpm. Normal LV size & fxn. EF 55-60%. No RWMA. Gr 1 DD? But normal LA size. Normal Valves. Normal RV, RVSP & RAP.    SOCIAL HISTORY:  Social History   Substance and Sexual Activity  Alcohol Use No   Alcohol/week: 0.0 standard drinks of alcohol    Social History   Substance and Sexual Activity  Drug Use No     Family History  Problem Relation Age of Onset   Hypertension Mother    Cancer Mother 31       Common Bile Duct   Hypertension Sister    Hypertension Brother    Diabetes Paternal Grandmother    Cancer Paternal Grandmother 81        brain cancer   Diabetes Paternal Grandfather    Hypertension Father    Cancer Father 66       kidney cancer    ALLERGIES:  Patient has no known allergies.  She has a current medication list which includes the following prescription(s): vitamin d, b-12, nurtec, spironolactone-hydrochlorothiazide, and metoprolol succinate.  Physical Exam: -Vitals:  BP (!) 132/100   Pulse 66   Ht 5\' 4"  (1.626 m)   Wt 213 lb 14.4 oz (97 kg)   LMP 03/30/2023   BMI 36.72 kg/m   PROCEDURE: Colposcopy performed with 4% acetic acid after verbal consent obtained                           -Aceto-white Lesions Location(s): See above              -Biopsy performed at 4 and 10 o'clock               -ECC indicated and performed: No.     -Biopsy sites made hemostatic  with pressure and Monsel's solution   -Satisfactory colposcopy: Yes.      -Evidence of Invasive cervical CA :  NO  ASSESSMENT:  Lori Gross is a 27 y.o. [phone] here for  1. 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  PLAN: 1.  I discussed the grading system of pap smears and HPV high risk viral types.  We will discuss management after colpo results return.  No orders of the defined types were placed in this encounter.          F/U  Return in about 2 weeks (around 04/21/2023) for Colpo f/u.  Brennan Bailey ,MD 04/07/2023,3:09 PM

## 2023-04-09 LAB — SURGICAL PATHOLOGY

## 2023-04-21 DIAGNOSIS — Z419 Encounter for procedure for purposes other than remedying health state, unspecified: Secondary | ICD-10-CM | POA: Diagnosis not present

## 2023-04-22 ENCOUNTER — Telehealth: Payer: Medicaid Other | Admitting: Obstetrics and Gynecology

## 2023-04-22 DIAGNOSIS — N870 Mild cervical dysplasia: Secondary | ICD-10-CM

## 2023-04-27 ENCOUNTER — Ambulatory Visit: Payer: Self-pay

## 2023-04-27 NOTE — Telephone Encounter (Signed)
  Chief Complaint: medication question Symptoms: pregnancy  Disposition: [] ED /[] Urgent Care (no appt availability in office) / [] Appointment(In office/virtual)/ []  [facility]/ [] Home Care/ [] Refused Recommended Disposition /[] [facility]/ [x]  Follow-up with PCP Additional Notes: Patient is newly pregnant and would like to know if she needs to stop her metoprolol - that is the only BP medication she is taking. Patient advised I would send message to PCP for review.     Reason for Disposition  [1] Caller has URGENT medicine question about med that PCP or specialist prescribed AND [2] triager unable to answer question  Answer Assessment - Initial Assessment Questions 1. NAME of MEDICINE: What medicine(s) are you calling about?     metoprolol  succinate (TOPROL -XL) 50 MG 24 hr tablet 2. QUESTION: What is your question? (e.g., double dose of medicine, side effect)     Patient wants to know if she needs to change her BP medication  3. PRESCRIBER: Who prescribed the medicine? Reason: if prescribed by specialist, call should be referred to that group.     [facility]  5. PREGNANCY:  Is there any chance that you are pregnant? When was your last menstrual period?     Positive pregnancy test  Patient returned call- she would like to know if she needs to stop her BP medication - patient is pregnant and awaiting her first Prenatal appointment. Reports she is only taking Metoprolol - not taking spironolactone -hydrochlorothiazide  (ALDACTAZIDE) 25-25 MG tablet  Protocols used: Medication Question Call-A-AH

## 2023-04-27 NOTE — Telephone Encounter (Signed)
 Patient called, left VM to return the call to the office to speak to the NT.   Summary: positive pregnancy test - adjust bp meds ?   Pt inquiring If she needs to stop taking the BP medication she is on. Pt took pregnancy test yesterday and it is positive. Pt has visit to confirm pregnancy w/ OBGYN on 05/11/23. Pt inquiring if different medication needs to be sent in.  Please assist pt further

## 2023-04-27 NOTE — Telephone Encounter (Signed)
 Lvm for patient to give [place] a call or they can go on My Chart and schedule the appt that best suits their need.

## 2023-04-28 ENCOUNTER — Ambulatory Visit (INDEPENDENT_AMBULATORY_CARE_PROVIDER_SITE_OTHER): Payer: Medicaid Other | Admitting: Family Medicine

## 2023-04-28 ENCOUNTER — Encounter: Payer: Self-pay | Admitting: Family Medicine

## 2023-04-28 VITALS — BP 150/88 | HR 97 | Temp 97.6°F | Resp 16 | Ht 64.0 in | Wt 209.9 lb

## 2023-04-28 DIAGNOSIS — G43009 Migraine without aura, not intractable, without status migrainosus: Secondary | ICD-10-CM

## 2023-04-28 DIAGNOSIS — E559 Vitamin D deficiency, unspecified: Secondary | ICD-10-CM

## 2023-04-28 DIAGNOSIS — E538 Deficiency of other specified B group vitamins: Secondary | ICD-10-CM

## 2023-04-28 DIAGNOSIS — I10 Essential (primary) hypertension: Secondary | ICD-10-CM

## 2023-04-28 DIAGNOSIS — Z3201 Encounter for pregnancy test, result positive: Secondary | ICD-10-CM | POA: Diagnosis not present

## 2023-04-28 DIAGNOSIS — E8881 Metabolic syndrome: Secondary | ICD-10-CM | POA: Diagnosis not present

## 2023-04-28 LAB — POCT URINE PREGNANCY: Preg Test, Ur: POSITIVE — AB

## 2023-04-28 MED ORDER — HYDROCHLOROTHIAZIDE 12.5 MG PO TABS: 12.5000 mg | ORAL_TABLET | Freq: Every day | ORAL | 1 refills | Status: DC

## 2023-04-28 MED ORDER — METHYLDOPA 250 MG PO TABS: 250.0000 mg | ORAL_TABLET | Freq: Two times a day (BID) | ORAL | 1 refills | Status: DC

## 2023-04-28 NOTE — Progress Notes (Signed)
 Name: Lori Gross   MRN: [id number]    DOB: July 26, 1995   Date:04/28/2023       Progress Note  Subjective  Chief Complaint  Chief Complaint  Patient presents with   Hypertension    Has not been taking BP meds due to finding out positive pregnancy at home   Positive pregnancy    At home x2 days ago, currently having nausea and breast are tender    HPI  Discussed the use of AI scribe software for clinical note transcription with the patient, who gave verbal consent to proceed.  History of Present Illness   The patient, with a history of hypertension, has recently discovered she is pregnant. She has been on a regimen of spironolactone , HCTZ, and metoprolol , but stopped these medications two days ago due to concerns about potential side effects on the pregnancy. In previous pregnancies, she was prescribed methyldopa  for hypertension management.  The patient also has a history of B12 deficiency, vitamin D  deficiency, high cholesterol, and proteinuria. She has been diagnosed with metabolic syndrome, characterized by slightly elevated blood sugars, high triglycerides, low good cholesterol, and obesity. she does not follow a low carbohydrate diet and indulges on soft drinks  The patient's last menstrual period was on December 10th, indicating she is approximately four weeks pregnant. She has a history of abnormal PAPs and is scheduled to see her gynecologist for prenatal care. She was not on birth control and the pregnancy was planned.  The patient also has a history of migraines, but does not take Nurtec. She usually manages her migraines with ibuprofen .         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 Normal LV size & fxn. EF 55-60%. No RWMA. Gr 1 DD? But normal LA size. Normal Valves. Normal RV, RVSP & RAP.    Family History  Problem Relation Age of Onset   Hypertension Mother    Cancer Mother 40       Common Bile Duct   Hypertension Sister    Hypertension Brother    Diabetes Paternal Grandmother    Cancer Paternal Grandmother 44       brain cancer   Diabetes Paternal Grandfather    Hypertension Father    Cancer Father 64       kidney cancer    Social History   Tobacco Use   Smoking status: Never   Smokeless tobacco: Never  Substance Use Topics   Alcohol use: No    Alcohol/week: 0.0 standard drinks of alcohol     Current Outpatient Medications:    Cholecalciferol (VITAMIN D ) 50 MCG (2000 UT) CAPS, Take 1 capsule by mouth daily., Disp: , Rfl:    Cyanocobalamin  (B-12) 1000 MCG SUBL, Place 1 tablet under the tongue daily., Disp: 100 tablet, Rfl: 1   Rimegepant Sulfate (NURTEC) 75 MG TBDP, Take  75 mg by mouth as needed (One tablet (75mg ) by mouth daily as needed)., Disp: 8 tablet, Rfl: 3   metoprolol  succinate (TOPROL -XL) 50 MG 24 hr tablet, Take 1 tablet (50 mg total) by mouth daily. Take with or immediately following a meal., Disp: 90 tablet, Rfl: 3   spironolactone -hydrochlorothiazide  (ALDACTAZIDE) 25-25 MG tablet, Take 1 tablet by mouth daily. (Patient not taking: Reported on 04/28/2023), Disp: 90 tablet, Rfl: 1  No Known Allergies  I personally reviewed active problem list, medication list, allergies with the patient/caregiver today.   ROS  Ten systems reviewed and is negative except as mentioned in HPI     Objective  Vitals:   04/28/23 1119  BP: (!) 150/88  Pulse: 97  Resp: 16  Temp: 97.6 F (36.4 C)  TempSrc: Oral  SpO2: 100%  Weight: 209 lb 14.4 oz (95.2 kg)  Height: 5' 4 (1.626 m)    Body mass index is 36.03 kg/m.  Physical Exam  Constitutional: Patient appears well-developed and well-nourished. Obese  No distress.  HEENT: head atraumatic, normocephalic, pupils equal and reactive to light, neck supple Cardiovascular: Normal rate, regular rhythm and normal heart sounds.  No murmur heard. No BLE edema. Pulmonary/Chest: Effort normal and breath sounds normal. No respiratory distress. Abdominal: Soft.  There is no tenderness. Psychiatric: Patient has a normal mood and affect. behavior is normal. Judgment and thought content normal.   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   Diagnosis - Low grade squamous intraepithelial lesion (LSIL) (A)    Comment Normal Reference Range HPV - Negative    Comment Normal Reference Range HPV 16- Negative    Comment Normal Reference Range HPV 16 18 45 -Negative   POCT urine pregnancy     Status: None   Collection Time: 04/07/23  2:30 PM  Result Value Ref Range   Preg Test, Ur Negative Negative  Surgical pathology     Status: None   Collection Time: 04/07/23  3:00 PM  Result Value Ref Range   SURGICAL PATHOLOGY      SURGICAL PATHOLOGY CASE: [id number] PATIENT: SUZEN METRO Surgical Pathology Report     Clinical History: LGSIL, HPV in female (cm)     FINAL MICROSCOPIC DIAGNOSIS:  A. CERVIX, 4 O'CLOCK, BIOPSY: - Low-grade squamous intraepithelial lesion (CIN1, low grade dysplasia)  B. CERVIX, 10 O'CLOCK, BIOPSY: - Koilocytic atypia, consistent with low-grade  squamous intraepithelial lesion (CIN1, low-grade dysplasia)       GROSS DESCRIPTION:  A.  4:00, received in formalin is a 0.5 x 0.4 x 0.4 cm tan-gray rubbery, soft tissue fragment submitted entirely in block A1.  B.  10:00, received in formalin is a 0.5 x 0.4 x 0.3 cm tan-white rubbery, soft tissue fragment submitted entirely in block B1.  SMB 04/08/23  Final Diagnosis performed by Katrine Muskrat, MD.   Electronically signed 04/09/2023 Technical component performed at [facility]. [facility], [address] [place], [place] [address].  Professional component performed at [facility], [address] [place], [place] [address].  Immunohistochemistry Technical component (if applicable) was performed at [facility]. [address],  [place], [place] [address].   IMMUNOHISTOCHEMISTRY DISCLAIMER (if applicable): Some of these immunohistochemical stains may have been developed and the performance characteristics determine by [facility]. Some may not have been cleared or approved by the U.S. Food and Drug Administration. The FDA has determined that such clearance or approval is not necessary. This test is used for clinical purposes. It should not be regarded as investigational or for research. This laboratory is certified under the Clinical Laboratory Improvement Amendments of 1988 (CLIA-88) as qualified to perform high complexity clinical laboratory testing.  The controls stained appropriately.   IHC stains are performed on formalin fixed, paraffin embedded tissue using a 3,3diaminobenzidine (DAB ) chromogen and Leica Bond Autostainer System. The staining intensity of the nucleus is score manually and is reported as the percentage of tumor cell nuclei demonstrating specific nuclear staining. The specimens are fixed in 10% Neutral Formalin for at least 6 hours and up to 72hrs. These tests are validated on  decalcified tissue. Results should be interpreted with caution given the possibility of false negative results on decalcified specimens. Antibody Clones are as follows ER-clone 12F, PR-clone 16, Ki67- clone MM1. Some of these immunohistochemical stains may have been developed and the performance characteristics determined by [facility].   POCT urine pregnancy     Status: Abnormal   Collection Time: 04/28/23 11:28 AM  Result Value Ref Range   Preg Test, Ur Positive (A) Negative    Diabetic Foot Exam:     PHQ2/9:    04/28/2023   11:22 AM 12/16/2022   11:50 AM 05/15/2022    2:37 PM 11/14/2021    2:06 PM 06/10/2021   10:53 AM  Depression screen PHQ 2/9  Decreased Interest 0 0 0 0 0  Down, Depressed, Hopeless 0 0 0 0 0  PHQ - 2 Score 0 0 0 0 0  Altered sleeping 0 0 0 0 0  Tired, decreased energy 0 0 0 0 0  Change in appetite 0 0 0 0 0  Feeling bad or failure about yourself  0 0 0 0 0  Trouble concentrating 0 0 0 0 0  Moving slowly or fidgety/restless 0 0 0 0 0  Suicidal thoughts 0 0 0 0 0  PHQ-9 Score 0 0 0 0 0  Difficult doing work/chores Not difficult at all   Not difficult at all Not difficult at all    phq 9 is negative   Fall Risk:    04/28/2023   11:15 AM 12/16/2022   11:50 AM 05/15/2022    2:36 PM 11/14/2021    2:06 PM 06/10/2021   10:52 AM  Fall Risk   Falls in the past year? 0 0 0 0 0  Number falls in past yr: 0   0 0  Injury with Fall? 0   0 0  Risk for fall due to : No Fall Risks No Fall Risks No Fall Risks No Fall Risks No Fall Risks  Follow up Falls prevention discussed;Education provided;Falls evaluation completed Falls prevention discussed Falls prevention discussed Falls prevention discussed;Education provided Falls prevention discussed     Assessment & Plan  Pregnancy Confirmed pregnancy, last menstrual period on December 10th, 2024. Estimated date of delivery is September 16th, 2025. -keep upcoming visit  with Dr. Janit. -Start prenatal  vitamins. -reminded her to avoid alcohol, tobacco products, drugs while pregnant  Hypertension in Pregnancy History of hypertension, currently on spironolactone  and HCTZ plus metoprolol . Discussed the risks/benefits of continuing these medications during pregnancy. -Discontinue  spironolactone  due to potential risks in pregnancy. -Continue HCTZ as it is safe in pregnancy sicne ongoing therapy and important for blood pressure control. -Change from metoprolol  to methyldopa  250mg  twice a day for better blood pressure control.  Morbid obesity -discussed life style modification -BMI over 35 with co-morbidities such as HTN, metabolic syndrome  Metabolic syndrome -needs to cut down on carbohydrates  General Health Maintenance -Get flu shot during prenatal visit with Dr. Janit as it is indicated for anyone pregnant during flu season. she did not want to get it today -Healthy diet and mild physical activity recommended for overall health and endurance during pregnancy.  Follow-up -Check with prenatal care provider if she will manage hypertension during pregnancy. -If not, return in 6 weeks for blood pressure management.

## 2023-05-02 DIAGNOSIS — Z419 Encounter for procedure for purposes other than remedying health state, unspecified: Secondary | ICD-10-CM | POA: Diagnosis not present

## 2023-05-05 ENCOUNTER — Other Ambulatory Visit: Payer: Self-pay | Admitting: Family Medicine

## 2023-05-05 MED ORDER — LABETALOL HCL 100 MG PO TABS: 100.0000 mg | ORAL_TABLET | Freq: Two times a day (BID) | ORAL | 0 refills | Status: DC

## 2023-05-08 ENCOUNTER — Emergency Department
Admission: EM | Admit: 2023-05-08 | Discharge: 2023-05-08 | Disposition: A | Discharge status: Home / Self Care | Payer: Medicaid Other | Attending: Emergency Medicine | Admitting: Emergency Medicine

## 2023-05-08 ENCOUNTER — Emergency Department: Payer: Medicaid Other

## 2023-05-08 ENCOUNTER — Other Ambulatory Visit: Payer: Self-pay

## 2023-05-08 DIAGNOSIS — O10911 Unspecified pre-existing hypertension complicating pregnancy, first trimester: Secondary | ICD-10-CM | POA: Insufficient documentation

## 2023-05-08 DIAGNOSIS — N83201 Unspecified ovarian cyst, right side: Secondary | ICD-10-CM | POA: Diagnosis not present

## 2023-05-08 DIAGNOSIS — O4691 Antepartum hemorrhage, unspecified, first trimester: Secondary | ICD-10-CM | POA: Diagnosis not present

## 2023-05-08 DIAGNOSIS — Z3A01 Less than 8 weeks gestation of pregnancy: Secondary | ICD-10-CM | POA: Insufficient documentation

## 2023-05-08 DIAGNOSIS — O3481 Maternal care for other abnormalities of pelvic organs, first trimester: Secondary | ICD-10-CM | POA: Diagnosis not present

## 2023-05-08 DIAGNOSIS — O209 Hemorrhage in early pregnancy, unspecified: Secondary | ICD-10-CM | POA: Diagnosis not present

## 2023-05-08 DIAGNOSIS — O4690 Antepartum hemorrhage, unspecified, unspecified trimester: Secondary | ICD-10-CM

## 2023-05-08 LAB — ABO/RH: ABO/RH(D): A NEG

## 2023-05-08 LAB — CBC
HCT: 36.2 % (ref 36.0–46.0)
Hemoglobin: 12.3 g/dL (ref 12.0–15.0)
MCH: 34.6 pg — ABNORMAL HIGH (ref 26.0–34.0)
MCHC: 34 g/dL (ref 30.0–36.0)
MCV: 102 fL — ABNORMAL HIGH (ref 80.0–100.0)
Platelets: 253 10*3/uL (ref 150–400)
RBC: 3.55 MIL/uL — ABNORMAL LOW (ref 3.87–5.11)
RDW: 13.2 % (ref 11.5–15.5)
WBC: 7.5 10*3/uL (ref 4.0–10.5)
nRBC: 0 % (ref 0.0–0.2)

## 2023-05-08 LAB — URINALYSIS, ROUTINE W REFLEX MICROSCOPIC
Bilirubin Urine: NEGATIVE
Glucose, UA: NEGATIVE mg/dL
Ketones, ur: 5 mg/dL — AB
Leukocytes,Ua: NEGATIVE
Nitrite: NEGATIVE
Protein, ur: NEGATIVE mg/dL
Specific Gravity, Urine: 1.019 (ref 1.005–1.030)
pH: 5 (ref 5.0–8.0)

## 2023-05-08 LAB — POC URINE PREG, ED: Preg Test, Ur: POSITIVE — AB

## 2023-05-08 LAB — HCG, QUANTITATIVE, PREGNANCY: hCG, Beta Chain, Quant, S: 10604 m[IU]/mL — ABNORMAL HIGH (ref <5)

## 2023-05-08 NOTE — ED Provider Notes (Signed)
[facility] Emergency Department Provider Note     Event Date/Time   First MD Initiated Contact with Patient 05/08/23 1615     (approximate)   History   Vaginal Bleeding (8wk preg)   HPI  Lori Gross is a 28 y.o. female G3P2 with a history of HTN and migraine presents to the ED with complaint of intermittent vaginal spotting with bright red blood and pelvic cramping x 2 days.Denies urinary symptoms. Denies abdominal pain, chest pain, HA, SOB and fever.     Physical Exam   Triage Vital Signs: ED Triage Vitals  Encounter Vitals Group     BP 05/08/23 1159 (!) 152/109     Systolic BP Percentile --      Diastolic BP Percentile --      Pulse Rate 05/08/23 1158 80     Resp 05/08/23 1158 16     Temp 05/08/23 1158 99 F (37.2 C)     Temp Source 05/08/23 1158 Oral     SpO2 05/08/23 1158 100 %     Weight 05/08/23 1159 205 lb (93 kg)     Height 05/08/23 1159 5\' 4"  (1.626 m)     Head Circumference --      Peak Flow --      Pain Score 05/08/23 1156 4     Pain Loc --      Pain Education --      Exclude from Growth Chart --     Most recent vital signs: Vitals:   05/08/23 1159 05/08/23 1633  BP: (!) 152/109   Pulse:    Resp:    Temp:  99 F (37.2 C)  SpO2:      General Awake, no distress.  HEENT NCAT. PERRL. EOMI. No rhinorrhea. Mucous membranes are moist.  CV:  Good peripheral perfusion.  RESP:  Normal effort.  ABD:  No distention. Soft non tender in all four quadrants.    ED Results / Procedures / Treatments   Labs (all labs ordered are listed, but only abnormal results are displayed) Labs Reviewed  CBC - Abnormal; Notable for the following components:      Result Value   RBC 3.55 (*)    MCV 102.0 (*)    MCH 34.6 (*)    All other components within normal limits  HCG, QUANTITATIVE, PREGNANCY - Abnormal; Notable for the following components:   hCG, Beta Chain, Quant, S 10,604 (*)    All other components within normal limits   URINALYSIS, ROUTINE W REFLEX MICROSCOPIC - Abnormal; Notable for the following components:   Color, Urine YELLOW (*)    APPearance CLEAR (*)    Hgb urine dipstick MODERATE (*)    Ketones, ur 5 (*)    Bacteria, UA RARE (*)    All other components within normal limits  POC URINE PREG, ED - Abnormal; Notable for the following components:   Preg Test, Ur Positive (*)    All other components within normal limits  ABO/RH   RADIOLOGY  I personally viewed and evaluated these images as part of my medical decision making, as well as reviewing the written report by the radiologist.  ED Provider Interpretation: IUP noted.  US OB LESS THAN 14 WEEKS WITH OB TRANSVAGINAL Result Date: 05/08/2023 CLINICAL DATA:  Intermittent vaginal bleeding for 5 days. Gestational age by last menstrual period is 5 weeks and 4 days. EXAM: OBSTETRIC <14 WK [place] AND TRANSVAGINAL OB US TECHNIQUE: Both transabdominal and transvaginal ultrasound  examinations were performed for complete evaluation of the gestation as well as the maternal uterus, adnexal regions, and pelvic cul-de-sac. Transvaginal technique was performed to assess early pregnancy. COMPARISON:  CT abdomen and pelvis dated 07/09/2022. FINDINGS: Intrauterine gestational sac: Single Yolk sac:  Visualized. Embryo:  Not Visualized. Cardiac Activity: Not Visualized. MSD: 9.1 mm   5 w   5  d Subchorionic hemorrhage:  None visualized. Maternal uterus/adnexae: A right ovarian cyst measures 2.8 x 2.8 x 3.0 cm. No imaging follow-up is recommended for this finding. No free intraperitoneal fluid. IMPRESSION: Intrauterine gestational sac and yolk sac with estimated gestational age of [redacted] weeks 5 days. Electronically Signed   By: Romona Curls M.D.   On: 05/08/2023 15:34    PROCEDURES:  Critical Care performed: No  Procedures   MEDICATIONS ORDERED IN ED: Medications - No data to display   IMPRESSION / MDM / ASSESSMENT AND PLAN / ED COURSE  I reviewed the triage vital signs  and the nursing notes.                               28 y.o. female presents to the emergency department for evaluation and treatment of vaginal spotting. See HPI for further details.   Differential diagnosis includes, but is not limited to ectopic pregnancy, miscarriage, implantation bleeding    Patient's presentation is most consistent with acute presentation with potential threat to life or bodily function.  Patient is alert and oriented.  She is hemodynamically stable and afebrile.  Patient is approximately [redacted] weeks pregnant at this time with vaginal spotting.  Lab work is reassuring.  Beta-hCG 10,604.  ABO/Rh performed - A Neg. urinalysis reassuring.  No evidence for UTI.  Ultrasound reveals intrauterine gestational sac and yolk sac with estimated gestational age of [redacted] weeks and 5 days.  Patient is encouraged to follow-up with her OB/GYN for repeat hCG.  Patient verbalized understanding.  At this time I do believe patient is in stable condition for discharge home.  Patient is ready to be discharged.  ED return precautions discussed.  FINAL CLINICAL IMPRESSION(S) / ED DIAGNOSES   Final diagnoses:  Vaginal bleeding in pregnancy    Rx / DC Orders   ED Discharge Orders     None      Note:  This document was prepared using Dragon voice recognition software and may include unintentional dictation errors.    Romeo Apple, Doral Digangi A, PA-C 05/08/23 1944    Merwyn Katos, MD 05/10/23 [phone]

## 2023-05-08 NOTE — ED Triage Notes (Signed)
Pt to ED with partner for intermittent bright red spotting and intermittent pelvic cramping since 2 days ago. Denies pain, burning with urination. Pt is G3P2. Hx HTN, hypertensive in triage. Missed night dose of HTN meds last night.

## 2023-05-08 NOTE — Discharge Instructions (Addendum)
You were evaluated in the ED for vaginal spotting.  Your lab work is reassuring.  Your ultrasound is normal.  Please follow-up with OB/GYN in 10 days for repeat beta-HCG

## 2023-05-08 NOTE — ED Notes (Signed)
Lt green tube was also sent with CBC in case ordered.

## 2023-05-11 ENCOUNTER — Ambulatory Visit: Payer: Medicaid Other

## 2023-05-17 ENCOUNTER — Telehealth (INDEPENDENT_AMBULATORY_CARE_PROVIDER_SITE_OTHER): Payer: Medicaid Other

## 2023-05-17 DIAGNOSIS — O0990 Supervision of high risk pregnancy, unspecified, unspecified trimester: Secondary | ICD-10-CM | POA: Insufficient documentation

## 2023-05-17 DIAGNOSIS — Z3480 Encounter for supervision of other normal pregnancy, unspecified trimester: Secondary | ICD-10-CM | POA: Insufficient documentation

## 2023-05-17 DIAGNOSIS — Z3689 Encounter for other specified antenatal screening: Secondary | ICD-10-CM | POA: Diagnosis not present

## 2023-05-17 NOTE — Progress Notes (Signed)
New OB Intake  I connected with  Lori Gross on 05/17/23 at 11:15 AM EST by MyChart Video Visit and verified that I am speaking with the correct person using two identifiers. Nurse is located at [facility] and pt is located at Work.  I discussed the limitations, risks, security and privacy concerns of performing an evaluation and management service by telephone and the availability of in person appointments. I also discussed with the patient that there may be a patient responsible charge related to this service. The patient expressed understanding and agreed to proceed.  I explained I am completing New OB Intake today. We discussed her EDD of 01/03/2024 that is based on Ultrasound done 05/08/2023  Pt is G3/P2. I reviewed her allergies, medications, Medical/Surgical/OB history, and appropriate screenings.  Are cats in the home? No. Based on history, this is a/an pregnancy uncomplicated . Her obstetrical history is significant for obesity.  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   Labs Discussed [name] genetic screening with patient. Patient will be genetic testing to be drawn at new OB visit. Discussed possible  labs to be drawn at new OB appointment.  COVID Vaccine Patient has not had COVID vaccine.   Social Determinants of Health Food Insecurity: denies food insecurity WIC Referral: Patient is interested in referral to [facility].  Transportation: Patient denies transportation needs. Childcare: Discussed no children allowed at ultrasound appointments.   First visit review I reviewed new OB appt with pt. I explained she will have ob bloodwork and pap smear/pelvic exam if indicated. Explained pt will be seen by Dr. Logan Gross at first visit 06/09/23; encounter routed to appropriate provider.   Lori Gross, CMA 05/17/2023  11:43 AM

## 2023-05-17 NOTE — Patient Instructions (Signed)
First Trimester of Pregnancy  The first trimester of pregnancy starts on the first day of your last monthly period until the end of week 13. This is months 1 through 3 of pregnancy. A week after a sperm fertilizes an egg, the egg will implant into the wall of the uterus and begin to develop into a baby. Body changes during your first trimester Your body goes through many changes during pregnancy. The changes usually return to normal after your baby is born. Physical changes Your breasts may grow larger and may hurt. The area around your nipples may get darker. Your periods will stop. Your hair and nails may grow faster. You may pee more often. Health changes You may tire easily. Your gums may bleed and may be sensitive when you brush and floss. You may not feel hungry. You may have heartburn. You may throw up or feel like you may throw up. You may want to eat some foods, but not others. You may have headaches. You may have trouble pooping (constipation). Other changes Your emotions may change from day to day. You may have more dreams. Follow these instructions at home: Medicines Talk to your health care provider if you're taking medicines. Ask if the medicines are safe to take during pregnancy. Your provider may change the medicines that you take. Do not take any medicines unless told to by your provider. Take a prenatal vitamin that has at least 600 micrograms (mcg) of folic acid. Do not use herbal medicines, illegal substances, or medicines that are not approved by your provider. Eating and drinking While you're pregnant your body needs extra food for your growing baby. Talk with your provider about what to eat while pregnant. Activity Most women are able to exercise during pregnancy. Exercises may need to change as your pregnancy goes on. Talk to your provider about your activities and exercise routines. Relieving pain and discomfort Wear a good, supportive bra if your breasts  hurt. Rest with your legs raised if you have leg cramps or low back pain. Safety Wear your seatbelt at all times when you're in a car. Talk to your provider if someone hits you, hurts you, or yells at you. Talk with your provider if you're feeling sad or have thoughts of hurting yourself. Lifestyle Certain things can be harmful while you're pregnant. Follow these rules: Do not use hot tubs, steam rooms, or saunas. Do not douche. Do not use tampons or scented pads. Do not drink alcohol,smoke, vape, or use products with nicotine or tobacco in them. If you need help quitting, talk with your provider. Avoid cat litter boxes and soil used by cats. These things carry germs that can cause harm to your pregnancy and your baby. General instructions Keep all follow-up visits. It helps you and your unborn baby stay as healthy as possible. Write down your questions. Take them to your visits. Your provider will: Talk with you about your overall health. Give you advice or refer you to specialists who can help with different needs, including: Prenatal education classes. Mental health and counseling. Foods and healthy eating. Ask for help if you need help with food. Call your dentist and ask to be seen. Brush your teeth with a soft toothbrush. Floss gently. Where to find more information American Pregnancy Association: americanpregnancy.org [employer]: acog.org Office on [employer] Health: [employer] Contact a health care provider if: You feel dizzy, faint, or have a fever. You vomit or have watery poop (diarrhea) for 2  days or more. You have abnormal discharge or bleeding from your vagina. You have pain when you pee or your pee smells bad. You have cramps, pain, or pressure in your belly area. Get help right away if: You have trouble breathing or chest pain. You have any kind of injury, such as from a fall or a car crash. These symptoms may be an  emergency. Get help right away. Call 911. Do not wait to see if the symptoms will go away. Do not drive yourself to the hospital. This information is not intended to replace advice given to you by your health care provider. Make sure you discuss any questions you have with your health care provider. Document Revised: 01/07/2023 Document Reviewed: 08/07/2022 Elsevier Patient Education  2024 [employer] Inc.   Common Medications Safe in Pregnancy  Acne:      Constipation:  Benzoyl Peroxide     Colace  Clindamycin      Dulcolax Suppository  Topica Erythromycin     Fibercon  Salicylic Acid      Metamucil         Miralax AVOID:        Senakot   Accutane    Cough:  Retin-A       Cough Drops  Tetracycline      Phenergan w/ Codeine if Rx  Minocycline      Robitussin (Plain & DM)  Antibiotics:     Crabs/Lice:  Ceclor       RID  Cephalosporins    AVOID:  E-Mycins      Kwell  Keflex  Macrobid/Macrodantin   Diarrhea:  Penicillin      Kao-Pectate  Zithromax      Imodium AD         PUSH FLUIDS AVOID:       Cipro     Fever:  Tetracycline      Tylenol (Regular or Extra  Minocycline       Strength)  Levaquin      Extra Strength-Do not          Exceed 8 tabs/24 hrs Caffeine:        200mg /day (equiv. To 1 cup of coffee or  approx. 3 12 oz sodas)         Gas: Cold/Hayfever:       Gas-X  Benadryl      Mylicon  Claritin       Phazyme  **Claritin-D        Chlor-Trimeton    Headaches:  Dimetapp      ASA-Free Excedrin  Drixoral-Non-Drowsy     Cold Compress  Mucinex (Guaifenasin)     Tylenol (Regular or Extra  Sudafed/Sudafed-12 Hour     Strength)  **Sudafed PE Pseudoephedrine   Tylenol Cold & Sinus     Vicks Vapor Rub  Zyrtec  **AVOID if Problems With Blood Pressure         Heartburn: Avoid lying down for at least 1 hour after meals  Aciphex      Maalox     Rash:  Milk of Magnesia     Benadryl    Mylanta       1% Hydrocortisone Cream  Pepcid  Pepcid Complete   Sleep  Aids:  Prevacid      Ambien   Prilosec       Benadryl  Rolaids       Chamomile Tea  Tums (Limit 4/day)     Unisom  Tylenol PM         Warm milk-add vanilla or  Hemorrhoids:       Sugar for taste  Anusol/Anusol H.C.  (RX: Analapram 2.5%)  Sugar Substitutes:  Hydrocortisone OTC     Ok in moderation  Preparation H      Tucks        Vaseline lotion applied to tissue with wiping    Herpes:     Throat:  Acyclovir      Oragel  Famvir  Valtrex     Vaccines:         Flu Shot Leg Cramps:       *Gardasil  Benadryl      Hepatitis A         Hepatitis B Nasal Spray:       Pneumovax  Saline Nasal Spray     Polio Booster         Tetanus Nausea:       Tuberculosis test or PPD  Vitamin B6 25 mg TID   AVOID:    Dramamine      *Gardasil  Emetrol       Live Poliovirus  Ginger Root 250 mg QID    MMR (measles, mumps &  High Complex Carbs @ Bedtime    rebella)  Sea Bands-Accupressure    Varicella (Chickenpox)  Unisom 1/2 tab TID     *No known complications           If received before Pain:         Known pregnancy;   Darvocet       Resume series after  Lortab        Delivery  Percocet    Yeast:   Tramadol      Femstat  Tylenol 3      Gyne-lotrimin  Ultram       Monistat  Vicodin           MISC:         All Sunscreens           Hair Coloring/highlights          Insect Repellant's          (Including DEET)         Mystic Tans   Commonly Asked Questions During Pregnancy   Cats: A parasite can be excreted in cat feces.  To avoid exposure you need to have another person empty the little box.  If you must empty the litter box you will need to wear gloves.  Wash your hands after handling your cat.  This parasite can also be found in raw or undercooked meat so this should also be avoided.  Colds, Sore Throats, Flu: Please check your medication sheet to see what you can take for symptoms.  If your symptoms are unrelieved by these medications please call the office.  Dental Work: Most  any dental work Agricultural consultant recommends is permitted.  X-rays should only be taken during the first trimester if absolutely necessary.  Your abdomen should be shielded with a lead apron during all x-rays.  Please notify your provider prior to receiving any x-rays.  Novocaine is fine; gas is not recommended.  If your dentist requires a note from [place] prior to dental work please call the office and we will provide one for you.  Exercise: Exercise is an important part of staying healthy during your pregnancy.  You may continue most exercises you were accustomed to prior to pregnancy.  Later in your pregnancy you will most likely notice you have difficulty with activities requiring balance like riding a bicycle.  It is important that you listen to your body and avoid activities that put you at a higher risk of falling.  Adequate rest and staying well hydrated are a must!  If you have questions about the safety of specific activities ask your provider.    Exposure to Children with illness: Try to avoid obvious exposure; report any symptoms to [place] when noted,  If you have chicken pos, red measles or mumps, you should be immune to these diseases.   Please do not take any vaccines while pregnant unless you have checked with your OB provider.  Fetal Movement: After 28 weeks we recommend you do "kick counts" twice daily.  Lie or sit down in a calm quiet environment and count your baby movements "kicks".  You should feel your baby at least 10 times per hour.  If you have not felt 10 kicks within the first hour get up, walk around and have something sweet to eat or drink then repeat for an additional hour.  If count remains less than 10 per hour notify your provider.  Fumigating: Follow your pest control agent's advice as to how long to stay out of your home.  Ventilate the area well before re-entering.  Hemorrhoids:   Most over-the-counter preparations can be used during pregnancy.  Check your medication to see what is  safe to use.  It is important to use a stool softener or fiber in your diet and to drink lots of liquids.  If hemorrhoids seem to be getting worse please call the office.   Hot Tubs:  Hot tubs Jacuzzis and saunas are not recommended while pregnant.  These increase your internal body temperature and should be avoided.  Intercourse:  Sexual intercourse is safe during pregnancy as long as you are comfortable, unless otherwise advised by your provider.  Spotting may occur after intercourse; report any bright red bleeding that is heavier than spotting.  Labor:  If you know that you are in labor, please go to the hospital.  If you are unsure, please call the office and let us help you decide what to do.  Lifting, straining, etc:  If your job requires heavy lifting or straining please check with your provider for any limitations.  Generally, you should not lift items heavier than that you can lift simply with your hands and arms (no back muscles)  Painting:  Paint fumes do not harm your pregnancy, but may make you ill and should be avoided if possible.  Latex or water based paints have less odor than oils.  Use adequate ventilation while painting.  Permanents & Hair Color:  Chemicals in hair dyes are not recommended as they cause increase hair dryness which can increase hair loss during pregnancy.  " Highlighting" and permanents are allowed.  Dye may be absorbed differently and permanents may not hold as well during pregnancy.  Sunbathing:  Use a sunscreen, as skin burns easily during pregnancy.  Drink plenty of fluids; avoid over heating.  Tanning Beds:  Because their possible side effects are still unknown, tanning beds are not recommended.  Ultrasound Scans:  Routine ultrasounds are performed at approximately 20 weeks.  You will be able to see your baby's general anatomy an if you would like to know the gender this can usually be determined as well.  If it is questionable when you conceived you may  also  receive an ultrasound early in your pregnancy for dating purposes.  Otherwise ultrasound exams are not routinely performed unless there is a medical necessity.  Although you can request a scan we ask that you pay for it when conducted because insurance does not cover " patient request" scans.  Work: If your pregnancy proceeds without complications you may work until your due date, unless your physician or employer advises otherwise.  Round Ligament Pain/Pelvic Discomfort:  Sharp, shooting pains not associated with bleeding are fairly common, usually occurring in the second trimester of pregnancy.  They tend to be worse when standing up or when you remain standing for long periods of time.  These are the result of pressure of certain pelvic ligaments called "round ligaments".  Rest, Tylenol and heat seem to be the most effective relief.  As the womb and fetus grow, they rise out of the pelvis and the discomfort improves.  Please notify the office if your pain seems different than that described.  It may represent a more serious condition.

## 2023-05-22 DIAGNOSIS — Z419 Encounter for procedure for purposes other than remedying health state, unspecified: Secondary | ICD-10-CM | POA: Diagnosis not present

## 2023-05-28 ENCOUNTER — Other Ambulatory Visit: Payer: Self-pay | Admitting: Family Medicine

## 2023-05-28 ENCOUNTER — Other Ambulatory Visit: Payer: Self-pay | Admitting: Obstetrics and Gynecology

## 2023-05-28 DIAGNOSIS — O3680X0 Pregnancy with inconclusive fetal viability, not applicable or unspecified: Secondary | ICD-10-CM

## 2023-05-31 ENCOUNTER — Ambulatory Visit (INDEPENDENT_AMBULATORY_CARE_PROVIDER_SITE_OTHER): Payer: Medicaid Other

## 2023-05-31 DIAGNOSIS — O3680X0 Pregnancy with inconclusive fetal viability, not applicable or unspecified: Secondary | ICD-10-CM

## 2023-05-31 DIAGNOSIS — Z3A08 8 weeks gestation of pregnancy: Secondary | ICD-10-CM

## 2023-05-31 DIAGNOSIS — Z3687 Encounter for antenatal screening for uncertain dates: Secondary | ICD-10-CM | POA: Diagnosis not present

## 2023-05-31 NOTE — Telephone Encounter (Signed)
 Requested medication (s) are due for refill today: yes  Requested medication (s) are on the active medication list: yes  Last refill:  05/05/23 #60  Future visit scheduled: no  Notes to clinic: previous reordered 1  months worth - pharmacy asking for a 90 day supply   Requested Prescriptions  Pending Prescriptions Disp Refills   labetalol  (NORMODYNE ) 100 MG tablet [Pharmacy Med Name: LABETALOL  HCL 100 MG TABLET] 180 tablet 1    Sig: TAKE 1 TABLET BY MOUTH TWICE A DAY     Cardiovascular:  Beta Blockers Failed - 05/31/2023  8:14 AM      Failed - Last BP in normal range    BP Readings from Last 1 Encounters:  05/08/23 (!) 144/98         Passed - Last Heart Rate in normal range    Pulse Readings from Last 1 Encounters:  05/08/23 80         Passed - Valid encounter within last 6 months    Recent Outpatient Visits           1 month ago Pregnancy test-positive   [facility] Arleen Lacer, MD   5 months ago Morbid obesity [facility])   [place] [facility] Sowles, Krichna, MD   1 year ago Morbid obesity [facility])   [place] [facility] Sowles, Krichna, MD   1 year ago Plantar fasciitis, left   [place] [facility] Sowles, Krichna, MD   1 year ago Palpitations   [facility] Sowles, Krichna, MD

## 2023-06-09 ENCOUNTER — Encounter: Payer: Medicaid Other | Admitting: Advanced Practice Midwife

## 2023-06-09 ENCOUNTER — Encounter: Payer: Medicaid Other | Admitting: Obstetrics and Gynecology

## 2023-06-19 DIAGNOSIS — Z419 Encounter for procedure for purposes other than remedying health state, unspecified: Secondary | ICD-10-CM | POA: Diagnosis not present

## 2023-06-23 ENCOUNTER — Other Ambulatory Visit (HOSPITAL_COMMUNITY)
Admission: RE | Admit: 2023-06-23 | Discharge: 2023-06-23 | Disposition: A | Discharge status: Home / Self Care | Source: Ambulatory Visit | Attending: Advanced Practice Midwife | Admitting: Advanced Practice Midwife

## 2023-06-23 ENCOUNTER — Encounter: Payer: Self-pay | Admitting: Advanced Practice Midwife

## 2023-06-23 ENCOUNTER — Ambulatory Visit (INDEPENDENT_AMBULATORY_CARE_PROVIDER_SITE_OTHER): Payer: Medicaid Other | Admitting: Advanced Practice Midwife

## 2023-06-23 VITALS — BP 126/74 | Wt 203.0 lb

## 2023-06-23 DIAGNOSIS — Z3A12 12 weeks gestation of pregnancy: Secondary | ICD-10-CM

## 2023-06-23 DIAGNOSIS — Z113 Encounter for screening for infections with a predominantly sexual mode of transmission: Secondary | ICD-10-CM

## 2023-06-23 DIAGNOSIS — Z3481 Encounter for supervision of other normal pregnancy, first trimester: Secondary | ICD-10-CM

## 2023-06-23 DIAGNOSIS — Z3480 Encounter for supervision of other normal pregnancy, unspecified trimester: Secondary | ICD-10-CM | POA: Diagnosis not present

## 2023-06-23 DIAGNOSIS — Z1379 Encounter for other screening for genetic and chromosomal anomalies: Secondary | ICD-10-CM

## 2023-06-23 DIAGNOSIS — Z131 Encounter for screening for diabetes mellitus: Secondary | ICD-10-CM

## 2023-06-23 DIAGNOSIS — Z130 Encounter for screening for diseases of the blood and blood-forming organs and certain disorders involving the immune mechanism: Secondary | ICD-10-CM

## 2023-06-23 DIAGNOSIS — Z0283 Encounter for blood-alcohol and blood-drug test: Secondary | ICD-10-CM

## 2023-06-23 DIAGNOSIS — Z0184 Encounter for antibody response examination: Secondary | ICD-10-CM

## 2023-06-23 NOTE — Patient Instructions (Signed)
 Morning Sickness Morning sickness is when you throw up or feel like you may throw up during pregnancy. This condition often occurs in the morning, but it can also occur at any time of day. Morning sickness is most common during the first three months of pregnancy, but it can go on throughout the pregnancy. Morning sickness is usually harmless. But if you throw up all the time, you should see your health care provider. You may also hear this condition called nausea and vomiting of pregnancy. What are the causes? The cause of morning sickness is not known. It may be linked to changes in hormones during pregnancy. What increases the risk? You're more likely to have morning sickness if: You had morning sickness in another pregnancy. You're pregnant with more than one baby, such as twins. You had morning sickness in other pregnancies. You have had motion sickness before you were pregnant. You have had bad headaches or migraines before you were pregnant. What are the signs or symptoms? Symptoms of morning sickness include: Feeling like you may throw up. Throwing up. How is this diagnosed? Morning sickness is diagnosed based on your symptoms. How is this treated? Treatment is usually not needed for morning sickness. You may only need to change what you eat. In some cases, your provider may give you: Vitamin B6 supplements. Medicines to prevent throwing up. Ginger. Follow these instructions at home: Medicines Take your medicines only as told by your provider. Do not use any prescription, over-the-counter, or herbal medicines for morning sickness without first talking with your provider. Take prenatal vitamins. These can stop or lessen the symptoms of morning sickness. If you feel like you may throw up after taking prenatal vitamins, take them at night or with a snack. Eating and drinking     Eat dry toast or crackers before getting out of bed. Eat 5 or 6 small meals a day. Try ginger ale  made with real ginger, ginger tea, or ginger candies. Drink fluids throughout the day. Eat protein foods when you need a snack. Nuts, yogurt, and cheese are good choices. Eat dry and bland foods like rice or baked potatoes. Foods that are high in carbohydrates are often helpful. Have someone cook for you if the smell of food makes you want to throw up. Foods to avoid Greasy foods. Fatty foods. Spicy foods. General instructions Try to avoid smells that make you feel sick. Use an air purifier to keep the air in your house free of smells. Try using an acupressure wristband. This is a wristband that's used to treat motion sickness. Try acupuncture. In this treatment, a provider puts thin needles into certain areas of your body to make you feel better. Brush your teeth after throwing up or rinse with a mix of baking soda and water. The acid in throw-up can hurt your teeth. Contact a health care provider if: Your symptoms do not get better. You feel dizzy or light-headed. You're losing weight. Get help right away if: The feeling that you may throw up will not go away, or you can't stop throwing up. You faint. You have very bad pain in your belly. This information is not intended to replace advice given to you by your health care provider. Make sure you discuss any questions you have with your health care provider. Document Revised: 01/07/2023 Document Reviewed: 07/16/2022 Elsevier Patient Education  2024 [employer]Exercise During Pregnancy Exercise is an important part of being healthy for people of all ages. Exercise helps your heart  and lungs work well. Exercise also: Helps you stay strong and flexible. Helps you keep a healthy body weight. Boosts your energy levels and improves your mood. You should try to exercise regularly during pregnancy. Exercise routines may need to change later in your pregnancy. In rare cases, certain medical problems in your pregnancy may limit the exercise  you can do during pregnancy. Your health care provider will give you information on what exercises will work for you. How does exercise help during pregnancy? Along with staying strong and flexible, exercising during pregnancy can help: Keep strength in muscles that are used during labor and birth. Control weight gain. Speed up your recovery after giving birth. Reduce the need for insulin if you get diabetes during pregnancy. Decrease low back pain. Lower the risk for depression. Lower the risk of cesarean delivery. Treat trouble pooping (constipation). How does exercise affect my baby? Exercise can help you have a healthy pregnancy. Exercise does not cause your baby to be born early. It will not cause your baby to weigh less at birth. What exercises can I do? Many exercises are safe for you to do during pregnancy. Do a variety of exercises that safely increase your heart and breathing rates and help you build and maintain muscle strength. Do exercises as told by your provider. Your provider may recommend: Walking. Swimming. Water aerobics. Riding a stationary bike. Modified yoga or Pilates. Tell your instructor that you're pregnant. Avoid overstretching. Avoid lying on your back for long periods of time. Resistance exercises with weights or elastic bands. Running or jogging. Choose this type of exercise only if: You ran or jogged regularly before your pregnancy. You can run or jog and still talk in full sentences. What exercises should I avoid? You may be told to limit high-intensity exercise depending on your level of fitness and if you exercised regularly before you became pregnant. You can tell that you're exercising at a high intensity if you're breathing much harder and faster and can't hold a conversation while exercising. You may be told to: Avoid jogging or running, unless you jogged or ran regularly before you became pregnant. Do not run or jog so fast that you're unable to have  a conversation. Avoid activities that put you at risk for falling on your belly or getting hit in the belly. Some of these are: Downhill skiing. Rock climbing. Cycling and gymnastics. Horseback riding. Surfing and waterskiing. Contact sports. Avoid scuba diving. Avoid skydiving. Avoid activities that take place in a room that's heated to high temperatures, such as hot yoga or hot Pilates. How do I exercise in a safe way?  Start slowly. Ask your provider to recommend the types of exercise that are safe for you. Avoid overheating. Do not exercise in very high temperatures or hot rooms. Avoid hot yoga or hot Pilates. Avoid standing still or lying flat on your back as much as you can. Avoid losing too much fluid (dehydration). Drink more fluids as told. Drink before, during, and after you exercise. Avoid overstretching. Because of hormone changes during pregnancy, it's easy to overstretch muscles, tendons, and ligaments. Ligaments are the tissues that connect bones to each other. Do not exercise to lose weight. Do not exercise at more than 6,000 feet above sea level (high elevation) if you don't live at that elevation. Tips and recommendations Wear loose-fitting, breathable clothes. Wear a sports bra to support your breasts. Exercise on most days or all days of the week. Try to exercise for 30 minutes  a day, 5 days a week. If problems come up during your pregnancy, you provider may tell you to limit some exercises or to exercise less. If you have concerns, ask your provider. If you actively exercised before your pregnancy, your provider may tell you to continue to do moderate-intensity to high-intensity exercise. If you're just starting to exercise or didn't exercise much before your pregnancy, your provider may tell you to do low-intensity to moderate-intensity exercise. Questions to ask your health care provider Is exercise safe for me? What are signs that I should stop exercising? Does  my health condition mean that I should not exercise during pregnancy? When should I avoid exercising during pregnancy? Stop exercising and contact a health care provider if: You have any unusual symptoms, such as: Mild contractions or cramps in the belly. Dizziness that does not go away when you rest. Headache. Pain and swelling of your calves. Bleeding or fluid leaking from your vagina. Stop exercising and get help right away if: You have: Chest pain. Shortness of breath. Sudden, severe pain in your low back or your belly. Regular, painful contractions before 37 weeks of pregnancy. These symptoms may be an emergency. Call 911 right away. Do not wait to see if the symptoms will go away. Do not drive yourself to the hospital. This information is not intended to replace advice given to you by your health care provider. Make sure you discuss any questions you have with your health care provider. Document Revised: 11/30/2022 Document Reviewed: 11/30/2022 Elsevier Patient Education  2024 [employer]Eating Plan for Pregnant Women While you are pregnant, your body requires additional nutrition to help support your growing baby. You also have a higher need for some vitamins and minerals, such as folic acid, calcium, iron, and vitamin D. Eating a healthy, well-balanced diet is very important for your health and your baby's health. Your need for extra calories varies over the course of your pregnancy. Pregnancy is divided into three trimesters, with each trimester lasting 3 months. For most women, it is recommended to consume: 150 extra calories a day during the first trimester. 300 extra calories a day during the second trimester. 300 extra calories a day during the third trimester. What are tips for following this plan? Cooking Practice good food safety and cleanliness. Wash your hands before you eat and after you prepare raw meat. Wash all fruits and vegetables well before peeling or eating.  Taking these actions can help to prevent foodborne illnesses that can be very dangerous to your baby, such as listeriosis. Ask your health care provider for more information about listeriosis. Make sure that all meats, poultry, and eggs are cooked to food-safe temperatures or "well-done." Meal planning  Eat a variety of foods (especially fruits and vegetables) to get a full range of vitamins and minerals. Two or more servings of fish are recommended each week in order to get the most benefits from omega-3 fatty acids that are found in seafood. Choose fish that are lower in mercury, such as salmon and pollock. Limit your overall intake of foods that have "empty calories." These are foods that have little nutritional value, such as sweets, desserts, candies, and sugar-sweetened beverages. Drinks that contain caffeine are okay to drink, but it is better to avoid caffeine. Keep your total caffeine intake to less than 200 mg each day (which is 12 oz or 355 mL of coffee, tea, or soda) or the limit as told by your health care provider. General information Do not try  to lose weight or go on a diet during pregnancy. Take a prenatal vitamin to help meet your additional vitamin and mineral needs during pregnancy, specifically for folic acid, iron, calcium, and vitamin D. Remember to stay active. Ask your health care provider what types of exercise and activities are safe for you. What does 150 extra calories look like? Healthy options that provide 150 extra calories each day could be any of the following: 6-8 oz (170-227 g) plain low-fat yogurt with  cup (70 g) berries. 1 apple with 2 tsp (11 g) peanut butter. Cut-up vegetables with  cup (60 g) hummus. 8 fl oz (237 mL) low-fat chocolate milk. 1 stick of string cheese with 1 medium orange. 1 peanut butter and jelly sandwich that is made with one slice of whole-wheat bread and 1 tsp (5 g) of peanut butter. For 300 extra calories, you could eat two of these  healthy options each day. What is a healthy amount of weight to gain? The right amount of weight gain for you is based on your BMI (body mass index) before you became pregnant. If your BMI was less than 18 (underweight), you should gain 28-40 lb (13-18 kg). If your BMI was 18-24.9 (normal), you should gain 25-35 lb (11-16 kg). If your BMI was 25-29.9 (overweight), you should gain 15-25 lb (7-11 kg). If your BMI was 30 or greater (obese), you should gain 11-20 lb (5-9 kg). What if I am having twins or multiples? Generally, if you are carrying twins or multiples: You may need to eat 300-600 extra calories a day. The recommended range for total weight gain is 25-54 lb (11-25 kg), depending on your BMI before pregnancy. Talk with your health care provider to find out about nutritional needs, weight gain, and exercise that is right for you. What foods should I eat?  Fruits All fruits. Eat a variety of colors and types of fruit. Remember to wash your fruits well before peeling or eating. Vegetables All vegetables. Eat a variety of colors and types of vegetables. Remember to wash your vegetables well before peeling or eating. Grains All grains. Choose whole grains, such as whole-wheat bread, oatmeal, or brown rice. Meats and other protein foods Lean meats, including chicken, [place], and lean cuts of beef, veal, or pork. Fish that is higher in omega-3 fatty acids and lower in mercury, such as salmon, herring, mussels, trout, sardines, pollock, shrimp, crab, and lobster. Tofu. Tempeh. Beans. Eggs. Peanut butter and other nut butters. Dairy Pasteurized milk and milk alternatives, such as almond milk. Pasteurized yogurt and pasteurized cheese. Cottage cheese. Sour cream. Beverages Water. Juices that contain 100% fruit juice or vegetable juice. Caffeine-free teas and decaffeinated coffee. Fats and oils Fats and oils are okay to include in moderation. Sweets and desserts Sweets and desserts are okay  to include in moderation. Seasoning and other foods All pasteurized condiments. The items listed above may not be a complete list of foods and beverages you can eat. Contact a dietitian for more information. What foods should I avoid? Fruits Raw (unpasteurized) fruit juices. Vegetables Unpasteurized vegetable juices. Meats and other protein foods Precooked or cured meat, such as bologna, hot dogs, sausages, or meat loaves. (If you must eat those meats, reheat them until they are steaming hot.) Refrigerated pate, meat spreads from a meat counter, or smoked seafood that is found in the refrigerated section of a store. Raw or undercooked meats, poultry, and eggs. Raw fish, such as sushi or sashimi. Fish that have high  mercury content, such as tilefish, shark, swordfish, and king mackerel. Dairy Unpasteurized milk and any foods that have unpasteurized milk in them. Soft cheeses, such as feta, queso blanco, queso fresco, [place], [place], panela, and blue-veined cheeses (unless they are made with pasteurized milk, which must be stated on the label). Beverages Alcohol. Sugar-sweetened beverages, such as sodas, teas, or energy drinks. Seasoning and other foods Homemade fermented foods and drinks, such as pickles, sauerkraut, or kombucha drinks. (Store-bought pasteurized versions of these are okay.) Salads that are made in a store or deli, such as ham salad, chicken salad, egg salad, tuna salad, and seafood salad. The items listed above may not be a complete list of foods and beverages you should avoid. Contact a dietitian for more information. Where to find more information To calculate the number of calories you need based on your height, weight, and activity level, you can use an online calculator such as: PayStrike.dk To calculate how much weight you should gain during pregnancy, you can use an online pregnancy weight gain calculator such as: http://www.harvey.com/ To learn more about  eating fish during pregnancy, talk with your health care provider or visit: PumpkinSearch.com.ee Summary While you are pregnant, your body requires additional nutrition to help support your growing baby. Eat a variety of foods, especially fruits and vegetables, to get a full range of vitamins and minerals. Practice good food safety and cleanliness. Wash your hands before you eat and after you prepare raw meat. Wash all fruits and vegetables well before peeling or eating. Taking these actions can help to prevent foodborne illnesses, such as listeriosis, that can be very dangerous to your baby. Do not eat raw meat or fish. Do not eat fish that have high mercury content, such as tilefish, shark, swordfish, and king mackerel. Do not eat raw (unpasteurized) dairy. Take a prenatal vitamin to help meet your additional vitamin and mineral needs during pregnancy, specifically for folic acid, iron, calcium, and vitamin D. This information is not intended to replace advice given to you by your health care provider. Make sure you discuss any questions you have with your health care provider. Document Revised: 10/31/2019 Document Reviewed: 11/02/2019 Elsevier Patient Education  2024 [employer].

## 2023-06-23 NOTE — Progress Notes (Signed)
 [facility]  New Obstetric Patient H&P    Chief Complaint: "Desires prenatal care"   History of Present Illness: Patient is a 28 y.o. [id number] Not Hispanic or Latino female, presents with amenorrhea and positive home pregnancy test. Patient's last menstrual period was 03/30/2023. and based on her  LMP, her EDD is Estimated Date of Delivery: 01/04/24 and her EGA is [redacted]w[redacted]d. Cycles are 4 days, regular, and occur approximately every : 28 days. Her last pap smear was 3 or 4 months ago and was low-grade squamous intraepithelial neoplasia (LGSIL - encompassing HPV,mild dysplasia,CIN I).    She had a urine pregnancy test which was positive 2 month(s)  ago. Her last menstrual period was normal and lasted for  4 day(s). Since her LMP she claims she has experienced breast tenderness, fatigue. She has had vaginal spotting with wiping following intercourse several times in the past 12 weeks. Last time was about 1 week ago. Reviewed administration of Rhogam within 72 hours of bleeding. Her past medical history is noncontributory. Her prior pregnancies are notable for  HTN with 37 week inductions- SVD's.  Since her LMP, she admits to the use of tobacco products  no She claims she has lost  2  pounds since the start of her pregnancy.  There are cats in the home in the home: No  She admits close contact with children on a regular basis  yes  She has had chicken pox in the past no She has had Tuberculosis exposures, symptoms, or previously tested positive for TB   no Current or past history of domestic violence. no  Genetic Screening/Teratology Counseling: (Includes patient, baby's father, or anyone in either family with:)   1. Patient's age >/= 73 at [facility]  no 2. Thalassemia ([place], [place], Mediterranean, or Asian background): MCV<80  no 3. Neural tube defect (meningomyelocele, spina bifida, anencephaly)  no 4. Congenital heart defect  no  5. Down syndrome  no 6. Tay-Sachs (Jewish, [place]  no 7.  Canavan's Disease  no 8. Sickle cell disease or trait (African)  no  9. Hemophilia or other blood disorders  no  10. Muscular dystrophy  no  11. Cystic fibrosis  no  12. Huntington's Chorea  no  13. Mental retardation/autism  no 14. Other inherited genetic or chromosomal disorder  no 15. Maternal metabolic disorder (DM, PKU, etc)  no 16. Patient or FOB with a child with a birth defect not listed above no  16a. Patient or FOB with a birth defect themselves no 17. Recurrent pregnancy loss, or stillbirth  no  18. Any medications since LMP other than prenatal vitamins (include vitamins, supplements, OTC meds, drugs, alcohol)  no 19. Any other genetic/environmental exposure to discuss  no  Infection History:   1. Lives with someone with TB or TB exposed  no  2. Patient or partner has history of genital herpes  no 3. Rash or viral illness since LMP  no 4. History of STI (GC, CT, HPV, syphilis, HIV)  no 5. History of recent travel :  no  Other pertinent information:  no     Review of Systems:10 point review of systems negative unless otherwise noted in HPI  Past Medical History:  Patient Active Problem List   Diagnosis Date Noted Date Diagnosed   Supervision of other normal pregnancy, antepartum 05/17/2023      Clinical Staff Provider  Office Location  [facility] Dating  01/03/2024, by Ultrasound  Language  English Anatomy US  Flu Vaccine  Will get at next visit Genetic Screen  NIPS:   TDaP vaccine    Hgb A1C or  GTT Early : Third trimester :   Covid Declined   LAB RESULTS   Rhogam  --/--/A NEG Performed at [facility], [address] [place], [place] [address]  [phone])  Blood Type --/--/A NEG Performed at [facility], [address] [place], [place] [address]  [phone])   RSV  Antibody    Feeding Plan bottle Rubella    Contraception  RPR NON-REACTIVE (08/28 1216)   Circumcision Yes HBsAg Negative (04/24 0902)   Pediatrician  [facility] in [place] HIV NON-REACTIVE (08/28 1216)  Support Person Father of child Varicella    Prenatal Classes No GBS  (For PCN allergy, check sensitivities)     Hep C     BTL Consent  Pap Diagnosis  Date Value Ref Range Status  03/10/2023 - Low grade squamous intraepithelial lesion (LSIL) (A)  Final    VBAC Consent  Hgb Electro      CF      SMA             B12 deficiency 11/14/2021    Palpitations 11/14/2021    Vitamin D deficiency 11/14/2021    Plantar fasciitis, left 11/14/2021    Morbid obesity (HCC) 09/04/2021    Systolic murmur 07/03/2021    Wrist pain, left 05/20/2021    Tachycardia 07/15/2018    Dyslipidemia 07/15/2018    Hypertension, benign 11/20/2015    Dysmenorrhea 10/08/2014    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 Normal LV size & fxn. EF 55-60%. No RWMA. Gr 1 DD? But normal LA size. Normal Valves. Normal RV, RVSP & RAP.    Gynecologic History: Patient's last menstrual period was 03/30/2023.  Obstetric History: [id number]  Family History:  Family History  Problem Relation Age of Onset   Hypertension Mother    Cancer Mother 33       Common Bile Duct   Hypertension Sister    Hypertension Brother    Diabetes Paternal Grandmother    Cancer Paternal Grandmother 41       brain cancer   Diabetes Paternal Grandfather    Hypertension Father    Cancer Father 11       kidney cancer    Social History:  Social History   Socioeconomic History   Marital status: Single    Spouse name: Not on file   Number of children: 2   Years of education: Not on file   Highest education level: High school graduate  Occupational  History   Occupation: Glass blower/designer: [employer]    Comment: [employer]  Tobacco Use   Smoking status: Never   Smokeless tobacco: Never  Vaping Use   Vaping status: Never Used  Substance and Sexual Activity   Alcohol use: No    Alcohol/week: 0.0 standard drinks of alcohol   Drug use: No   Sexual activity: Yes    Partners: Male    Birth control/protection: None  Other  Topics Concern   Not on file  Social History Narrative   She was living with Lisbeth Ply they have  a baby girl - Rodman Pickle on July 21st, 2017 and one boy named Lisbeth Ply in 2018, they lived together however separated since Nov 2023    Working full time([employer])    Social Drivers of Corporate investment banker Strain: Medium Risk (05/17/2023)   Overall Financial Resource Strain (CARDIA)    Difficulty of Paying Living Expenses: Somewhat hard  Food Insecurity: No Food Insecurity (05/17/2023)   Hunger Vital Sign    Worried About Running Out of Food in the Last Year: Never true    Ran Out of Food in the Last Year: Never true  Transportation Needs: No Transportation Needs (05/17/2023)   PRAPARE - Administrator, Civil Service (Medical): No    Lack of Transportation (Non-Medical): No  Physical Activity: Insufficiently Active (05/17/2023)   Exercise Vital Sign    Days of Exercise per Week: 1 day    Minutes of Exercise per Session: 30 min  Stress: No Stress Concern Present (05/17/2023)   [employer] of Occupational Health - Occupational Stress Questionnaire    Feeling of Stress : Only a little  Social Connections: Socially Isolated (05/17/2023)   Social Connection and Isolation Panel [NHANES]    Frequency of Communication with Friends and Family: Once a week    Frequency of Social Gatherings with Friends and Family: Never    Attends Religious Services: Never    Database administrator or Organizations: No    Attends Banker Meetings: Never    Marital Status: Living with  partner  Intimate Partner Violence: Not At Risk (05/17/2023)   Humiliation, Afraid, Rape, and Kick questionnaire    Fear of Current or Ex-Partner: No    Emotionally Abused: No    Physically Abused: No    Sexually Abused: No    Allergies:  No Known Allergies  Medications: Prior to Admission medications   Medication Sig Start Date End Date Taking? Authorizing Provider  hydrochlorothiazide (HYDRODIURIL) 12.5 MG tablet Take 1 tablet (12.5 mg total) by mouth daily. 04/28/23  Yes Sowles, Danna Hefty, MD  labetalol (NORMODYNE) 100 MG tablet TAKE 1 TABLET BY MOUTH TWICE A DAY 05/31/23  Yes Alba Cory, MD  Prenatal MV & Min w/FA-DHA (PRENATAL GUMMIES PO) Take by mouth.   Yes [provider]    Physical Exam Vitals: Blood pressure 126/74, weight 203 lb (92.1 kg), last menstrual period 03/30/2023.  General: NAD HEENT: normocephalic, anicteric Thyroid: no enlargement, no palpable nodules Pulmonary: No increased work of breathing, CTAB Cardiovascular: RRR, distal pulses 2+ Abdomen: NABS, soft, non-tender, non-distended.  Umbilicus without lesions. No masses palpable. No evidence of hernia. FHTs by doppler 159  Genitourinary: deferred for PAP interval/patient self swab aptima Extremities: no edema, erythema, or tenderness Neurologic: Grossly intact Psychiatric: mood appropriate, affect full   The following were addressed during this visit:  Breastfeeding Education - Early initiation of breastfeeding    Comments: Keeps milk supply adequate, helps contract uterus and slow bleeding, and early milk is the perfect first food and is easy to digest.   - The importance of exclusive breastfeeding    Comments: Provides antibodies, Lower risk of breast and ovarian cancers, and type-2 diabetes,Helps your body recover, Reduced chance of SIDS.   - The importance of early skin-to-skin contact    Comments:  Keeps baby warm and secure, helps keep baby's blood sugar up and breathing steady,  easier to bond and breastfeed, and helps calm baby.  - Rooming-in on a 24-hour basis    Comments: Easier to learn baby's feeding cues, easier to bond and get to know each other, and encourages milk production.   - Frequent feeding to help assure optimal milk production    Comments: Making a full supply of milk requires frequent removal of milk from breasts, infant will eat 8-12 times in 24 hours, if separated from infant use breast massage, hand expression and/ or pumping to remove milk from breasts.   - Effective positioning and attachment    Comments: Helps my baby to get enough breast milk, helps to produce an adequate milk supply, and helps prevent nipple pain and damage   - Individualized Education    Comments: Contraindications to breastfeeding and other special medical conditions Patient preference to formula feed. She agreed to initial breastfeeding education.     Assessment: 28 y.o. G3P2002 at [redacted]w[redacted]d presenting to initiate prenatal care  Plan: 1) Avoid alcoholic beverages. 2) Patient encouraged not to smoke.  3) Discontinue the use of all non-medicinal drugs and chemicals.  4) Take prenatal vitamins daily.  5) Nutrition, food safety (fish, cheese advisories, and high nitrite foods) and exercise discussed. 6) Hospital and practice style discussed with cross coverage system.  7) Genetic Screening, such as with 1st Trimester Screening, cell free fetal DNA, AFP testing, and Ultrasound, as well as with amniocentesis and CVS as appropriate, is discussed with patient. At the conclusion of today's visit patient requested genetic testing 8) Patient is asked about travel to areas at risk for the Zika virus, and counseled to avoid travel and exposure to mosquitoes or sexual partners who may have themselves been exposed to the virus. Testing is discussed, and will be ordered as appropriate. 9) NOB labs today 10) Return to clinic in 4 weeks for ROB    Tresea Mall, CNM [place]  [facility] 06/23/2023 5:59 PM

## 2023-06-24 LAB — CBC/D/PLT+RPR+RH+ABO+RUBIGG...
Antibody Screen: NEGATIVE
Basophils Absolute: 0 10*3/uL (ref 0.0–0.2)
Basos: 0 %
EOS (ABSOLUTE): 0 10*3/uL (ref 0.0–0.4)
Eos: 1 %
HCV Ab: NONREACTIVE
HIV Screen 4th Generation wRfx: NONREACTIVE
Hematocrit: 34.9 % (ref 34.0–46.6)
Hemoglobin: 11.4 g/dL (ref 11.1–15.9)
Hepatitis B Surface Ag: NEGATIVE
Immature Grans (Abs): 0 10*3/uL (ref 0.0–0.1)
Immature Granulocytes: 0 %
Lymphocytes Absolute: 1.8 10*3/uL (ref 0.7–3.1)
Lymphs: 22 %
MCH: 34 pg — ABNORMAL HIGH (ref 26.6–33.0)
MCHC: 32.7 g/dL (ref 31.5–35.7)
MCV: 104 fL — ABNORMAL HIGH (ref 79–97)
Monocytes Absolute: 0.4 10*3/uL (ref 0.1–0.9)
Monocytes: 5 %
Neutrophils Absolute: 5.7 10*3/uL (ref 1.4–7.0)
Neutrophils: 72 %
Platelets: 290 10*3/uL (ref 150–450)
RBC: 3.35 x10E6/uL — ABNORMAL LOW (ref 3.77–5.28)
RDW: 13.1 % (ref 11.7–15.4)
RPR Ser Ql: NONREACTIVE
Rh Factor: NEGATIVE
Rubella Antibodies, IGG: 1.11 {index} (ref >0.99)
Varicella zoster IgG: REACTIVE
WBC: 7.8 10*3/uL (ref 3.4–10.8)

## 2023-06-24 LAB — URINALYSIS, ROUTINE W REFLEX MICROSCOPIC
Bilirubin, UA: NEGATIVE
Glucose, UA: NEGATIVE
Nitrite, UA: NEGATIVE
RBC, UA: NEGATIVE
Specific Gravity, UA: 1.022 (ref 1.005–1.030)
Urobilinogen, Ur: 1 mg/dL (ref 0.2–1.0)
pH, UA: 6 (ref 5.0–7.5)

## 2023-06-24 LAB — PROTEIN / CREATININE RATIO, URINE
Creatinine, Urine: 138.5 mg/dL
Protein, Ur: 10.4 mg/dL
Protein/Creat Ratio: 75 mg/g{creat} (ref 0–200)

## 2023-06-24 LAB — CERVICOVAGINAL ANCILLARY ONLY
Bacterial Vaginitis (gardnerella): POSITIVE — AB
Candida Glabrata: NEGATIVE
Candida Vaginitis: POSITIVE — AB
Chlamydia: POSITIVE — AB
Comment: NEGATIVE
Comment: NEGATIVE
Comment: NEGATIVE
Comment: NEGATIVE
Comment: NEGATIVE
Comment: NORMAL
Neisseria Gonorrhea: NEGATIVE
Trichomonas: NEGATIVE

## 2023-06-24 LAB — MICROSCOPIC EXAMINATION
Casts: NONE SEEN /LPF
Epithelial Cells (non renal): >10 /HPF — AB (ref 0–10)
RBC, Urine: NONE SEEN /HPF (ref 0–2)

## 2023-06-24 LAB — COMPREHENSIVE METABOLIC PANEL
ALT: 11 IU/L (ref 0–32)
AST: 16 IU/L (ref 0–40)
Albumin: 4.2 g/dL (ref 4.0–5.0)
Alkaline Phosphatase: 85 IU/L (ref 44–121)
BUN/Creatinine Ratio: 7 — ABNORMAL LOW (ref 9–23)
BUN: 4 mg/dL — ABNORMAL LOW (ref 6–20)
Bilirubin Total: 0.4 mg/dL (ref 0.0–1.2)
CO2: 19 mmol/L — ABNORMAL LOW (ref 20–29)
Calcium: 9.5 mg/dL (ref 8.7–10.2)
Chloride: 103 mmol/L (ref 96–106)
Creatinine, Ser: 0.54 mg/dL — ABNORMAL LOW (ref 0.57–1.00)
Globulin, Total: 2.7 g/dL (ref 1.5–4.5)
Glucose: 74 mg/dL (ref 70–99)
Potassium: 4.2 mmol/L (ref 3.5–5.2)
Sodium: 137 mmol/L (ref 134–144)
Total Protein: 6.9 g/dL (ref 6.0–8.5)
eGFR: 128 mL/min/{1.73_m2} (ref >59)

## 2023-06-24 LAB — HCV INTERPRETATION

## 2023-06-24 LAB — HEMOGLOBIN A1C
Est. average glucose Bld gHb Est-mCnc: 97 mg/dL
Hgb A1c MFr Bld: 5 % (ref 4.8–5.6)

## 2023-06-27 LAB — MATERNIT 21 PLUS CORE, BLOOD
Fetal Fraction: 19
Result (T21): NEGATIVE
Trisomy 13 (Patau syndrome): NEGATIVE
Trisomy 18 (Edwards syndrome): NEGATIVE
Trisomy 21 (Down syndrome): NEGATIVE

## 2023-06-28 LAB — URINE CULTURE, OB REFLEX

## 2023-06-28 LAB — CULTURE, OB URINE

## 2023-07-01 LAB — MONITOR DRUG PROFILE 14(MW)

## 2023-07-01 LAB — NICOTINE SCREEN, URINE

## 2023-07-05 ENCOUNTER — Other Ambulatory Visit: Payer: Self-pay | Admitting: Family Medicine

## 2023-07-05 ENCOUNTER — Other Ambulatory Visit: Payer: Self-pay | Admitting: Advanced Practice Midwife

## 2023-07-05 ENCOUNTER — Encounter: Payer: Self-pay | Admitting: Advanced Practice Midwife

## 2023-07-05 DIAGNOSIS — A749 Chlamydial infection, unspecified: Secondary | ICD-10-CM

## 2023-07-05 DIAGNOSIS — B3731 Acute candidiasis of vulva and vagina: Secondary | ICD-10-CM

## 2023-07-05 DIAGNOSIS — B9689 Other specified bacterial agents as the cause of diseases classified elsewhere: Secondary | ICD-10-CM

## 2023-07-05 MED ORDER — FLUCONAZOLE 150 MG PO TABS: 150.0000 mg | ORAL_TABLET | Freq: Once | ORAL | 1 refills | Status: AC

## 2023-07-05 MED ORDER — AZITHROMYCIN 500 MG PO TABS: 1000.0000 mg | ORAL_TABLET | Freq: Once | ORAL | 0 refills | Status: AC

## 2023-07-05 MED ORDER — METRONIDAZOLE 500 MG PO TABS: 500.0000 mg | ORAL_TABLET | Freq: Two times a day (BID) | ORAL | 0 refills | Status: AC

## 2023-07-05 NOTE — Progress Notes (Signed)
 Message to patient and medications sent.

## 2023-07-21 ENCOUNTER — Ambulatory Visit (INDEPENDENT_AMBULATORY_CARE_PROVIDER_SITE_OTHER): Admitting: Advanced Practice Midwife

## 2023-07-21 ENCOUNTER — Encounter: Payer: Self-pay | Admitting: Advanced Practice Midwife

## 2023-07-21 VITALS — BP 141/75 | Wt 204.0 lb

## 2023-07-21 DIAGNOSIS — O10919 Unspecified pre-existing hypertension complicating pregnancy, unspecified trimester: Secondary | ICD-10-CM

## 2023-07-21 DIAGNOSIS — Z3689 Encounter for other specified antenatal screening: Secondary | ICD-10-CM | POA: Diagnosis not present

## 2023-07-21 DIAGNOSIS — Z3A16 16 weeks gestation of pregnancy: Secondary | ICD-10-CM | POA: Diagnosis not present

## 2023-07-21 DIAGNOSIS — O0992 Supervision of high risk pregnancy, unspecified, second trimester: Secondary | ICD-10-CM | POA: Diagnosis not present

## 2023-07-21 HISTORY — DX: Unspecified pre-existing hypertension complicating pregnancy, unspecified trimester: O10.919

## 2023-07-21 NOTE — Progress Notes (Signed)
 Routine Prenatal Care Visit  Subjective  Lori Gross is a 28 y.o. G3P2002 at [redacted]w[redacted]d being seen today for ongoing prenatal care.  She is currently monitored for the following issues for this high-risk pregnancy and has Dysmenorrhea; Axillary hyperhidrosis; Microalbuminuria; Obesity (BMI 30.0-34.9); Migraine without aura and responsive to treatment; Hypertension, benign; Tachycardia; Dyslipidemia; Wrist pain, left; Systolic murmur; Morbid obesity (HCC); B12 deficiency; Palpitations; Vitamin D deficiency; Plantar fasciitis, left; Supervision of high-risk pregnancy; and Chronic hypertension affecting pregnancy on their problem list.  ----------------------------------------------------------------------------------- Patient reports she is doing well. Both she and her partner have been treated for chlamydia. Will do TOC at 20 week visit. She usually takes her labetalol as prescribed although did not take it this morning.  She denies any headaches. Contractions: Not present. Vag. Bleeding: None.  Movement: Absent. Leaking Fluid denies.  ----------------------------------------------------------------------------------- The following portions of the patient's history were reviewed and updated as appropriate: allergies, current medications, past family history, past medical history, past social history, past surgical history and problem list. Problem list updated.  Objective  Blood pressure (!) 141/75, weight 204 lb (92.5 kg), last menstrual period 03/30/2023. Pregravid weight 205 lb (93 kg) Total Weight Gain -1 lb (-0.454 kg) Urinalysis: Urine Protein    Urine Glucose    Fetal Status: Fetal Heart Rate (bpm): 143   Movement: Absent     General:  Alert, oriented and cooperative. Patient is in no acute distress.  Skin: Skin is warm and dry. No rash noted.   Cardiovascular: Normal heart rate noted  Respiratory: Normal respiratory effort, no problems with respiration noted  Abdomen: Soft, gravid,  appropriate for gestational age. Pain/Pressure: Absent     Pelvic:  Cervical exam deferred        Extremities: Normal range of motion.  Edema: None  Mental Status: Normal mood and affect. Normal behavior. Normal judgment and thought content.   Assessment   28 y.o. G3P2002 at [redacted]w[redacted]d by  01/04/2024, by Last Menstrual Period presenting for routine prenatal visit  Plan   Third Problems (from 05/17/23 to present)     Problem Noted Diagnosed Resolved   Supervision of high-risk pregnancy 05/17/2023 by Fonda Kinder, CMA  No   Overview Signed 05/17/2023 11:16 AM by Fonda Kinder, CMA   Clinical Staff Provider  Office Location  [facility] Dating  01/03/2024, by Ultrasound  Language  English Anatomy US    Flu Vaccine  Will get at next visit Genetic Screen  NIPS:   TDaP vaccine    Hgb A1C or  GTT Early : Third trimester :   Covid Declined   LAB RESULTS   Rhogam  --/--/A NEG Performed at [facility], [address] [place], [place] [address]  [phone])  Blood Type --/--/A NEG Performed at [facility], [address] [place], [place] [address]  [phone])   RSV  Antibody    Feeding Plan bottle Rubella    Contraception  RPR NON-REACTIVE (08/28 1216)   Circumcision Yes HBsAg Negative (04/24 0902)   Pediatrician  [facility] in [place] HIV NON-REACTIVE (08/28 1216)  Support Person Father of child Varicella    Prenatal Classes No GBS  (For PCN allergy, check sensitivities)     Hep C     BTL Consent  Pap Diagnosis  Date Value Ref Range Status  03/10/2023 - Low grade squamous intraepithelial lesion (LSIL) (A)  Final    VBAC Consent  Hgb Electro      CF  SMA                 Needs CT TOC at 20 week visit   Preterm labor symptoms and general obstetric precautions including but not limited to vaginal bleeding, contractions, leaking of fluid and fetal movement were reviewed in detail with the patient. Please refer to After Visit Summary for  other counseling recommendations.   Return in about 4 weeks (around 08/18/2023) for anatomy scan and rob after.  Tresea Mall, CNM 07/21/2023 3:43 PM

## 2023-07-31 DIAGNOSIS — Z419 Encounter for procedure for purposes other than remedying health state, unspecified: Secondary | ICD-10-CM | POA: Diagnosis not present

## 2023-08-18 ENCOUNTER — Other Ambulatory Visit (HOSPITAL_COMMUNITY)
Admission: RE | Admit: 2023-08-18 | Discharge: 2023-08-18 | Disposition: A | Discharge status: Home / Self Care | Source: Ambulatory Visit | Attending: Obstetrics | Admitting: Obstetrics

## 2023-08-18 ENCOUNTER — Ambulatory Visit
Admission: RE | Admit: 2023-08-18 | Discharge: 2023-08-18 | Disposition: A | Discharge status: Home / Self Care | Source: Ambulatory Visit | Attending: Advanced Practice Midwife | Admitting: Advanced Practice Midwife

## 2023-08-18 ENCOUNTER — Encounter: Payer: Self-pay | Admitting: Obstetrics

## 2023-08-18 ENCOUNTER — Ambulatory Visit (INDEPENDENT_AMBULATORY_CARE_PROVIDER_SITE_OTHER): Admitting: Obstetrics

## 2023-08-18 VITALS — BP 135/89 | HR 80 | Wt 206.0 lb

## 2023-08-18 DIAGNOSIS — Z3A20 20 weeks gestation of pregnancy: Secondary | ICD-10-CM

## 2023-08-18 DIAGNOSIS — O0990 Supervision of high risk pregnancy, unspecified, unspecified trimester: Secondary | ICD-10-CM

## 2023-08-18 DIAGNOSIS — I10 Essential (primary) hypertension: Secondary | ICD-10-CM

## 2023-08-18 DIAGNOSIS — Z8619 Personal history of other infectious and parasitic diseases: Secondary | ICD-10-CM

## 2023-08-18 DIAGNOSIS — Z3689 Encounter for other specified antenatal screening: Secondary | ICD-10-CM | POA: Insufficient documentation

## 2023-08-18 DIAGNOSIS — Z369 Encounter for antenatal screening, unspecified: Secondary | ICD-10-CM | POA: Diagnosis not present

## 2023-08-18 DIAGNOSIS — O10012 Pre-existing essential hypertension complicating pregnancy, second trimester: Secondary | ICD-10-CM | POA: Diagnosis not present

## 2023-08-18 DIAGNOSIS — O10919 Unspecified pre-existing hypertension complicating pregnancy, unspecified trimester: Secondary | ICD-10-CM

## 2023-08-18 DIAGNOSIS — Z362 Encounter for other antenatal screening follow-up: Secondary | ICD-10-CM

## 2023-08-18 MED ORDER — LABETALOL HCL 100 MG PO TABS: 300.0000 mg | ORAL_TABLET | Freq: Two times a day (BID) | ORAL | 6 refills | Status: DC

## 2023-08-18 NOTE — Assessment & Plan Note (Signed)
-  Anatomy US  incomplete today. Repeat in 3 weeks -Reviewed kick counts and preterm labor warning signs. Instructed to call office or come to hospital with persistent headache, vision changes, regular contractions, leaking of fluid, decreased fetal movement or vaginal bleeding.   -TOC for chlamydia collected today

## 2023-08-18 NOTE — Assessment & Plan Note (Signed)
-  Discussed medications with Dr. Dell Fennel. Will d/c hydrochlorothiazide  and increase labetalol  to 300 mg BID. -Discussed delivery timing -Reviewed s/s of severe HTN/preeclampsia

## 2023-08-18 NOTE — Progress Notes (Signed)
    Return Prenatal Note   Assessment/Plan   Plan  28 y.o. G3P2002 at [redacted]w[redacted]d presents for follow-up OB visit. Reviewed prenatal record including previous visit note.  Chronic hypertension affecting pregnancy -Discussed medications with Dr. Dell Fennel. Will d/c hydrochlorothiazide  and increase labetalol  to 300 mg BID. -Discussed delivery timing -Reviewed s/s of severe HTN/preeclampsia  Supervision of high-risk pregnancy -Anatomy US  incomplete today. Repeat in 3 weeks -Reviewed kick counts and preterm labor warning signs. Instructed to call office or come to hospital with persistent headache, vision changes, regular contractions, leaking of fluid, decreased fetal movement or vaginal bleeding.   -TOC for chlamydia collected today   Orders Placed This Encounter  Procedures   US  OB Follow Up    Standing Status:   Future    Expected Date:   09/10/2023    Expiration Date:   08/17/2024    Reason for Exam (SYMPTOM  OR DIAGNOSIS REQUIRED):   incomplete anatomy    Preferred Imaging Location?:   [facility]   No follow-ups on file.   No future appointments.  For next visit:  Routine prenatal care    Subjective   Lori Gross is feeling lots of baby movement! She had her US  at the hospital today but results are not in yet. She was told she needs to repeat the scan in 3 weeks for incomplete views of the heart. She has been taking her HTN meds daily.  Movement: Present Contractions: Not present  Objective   Flow sheet Vitals: Pulse Rate: 80 BP: 135/89 Fundal Height: 20 cm Fetal Heart Rate (bpm): 132 Total weight gain: 1 lb (0.454 kg)  General Appearance  No acute distress, well appearing, and well nourished Pulmonary   Normal work of breathing Neurologic   Alert and oriented to person, place, and time Psychiatric   Mood and affect within normal limits  Josue Nip, CNM 08/18/23 4:14 PM

## 2023-08-18 NOTE — Assessment & Plan Note (Deleted)
-  Discussed medications with Dr. Dell Fennel. Will d/c hydrochlorothiazide  and increase labetalol  to 300 mg BID. -Discussed delivery timing -Reviewed s/s of severe HTN/preeclampsia

## 2023-08-19 ENCOUNTER — Other Ambulatory Visit: Payer: Self-pay

## 2023-08-19 ENCOUNTER — Observation Stay
Admission: EM | Admit: 2023-08-19 | Discharge: 2023-08-19 | Disposition: A | Discharge status: Home / Self Care | Attending: Obstetrics and Gynecology | Admitting: Obstetrics and Gynecology

## 2023-08-19 ENCOUNTER — Encounter: Payer: Self-pay | Admitting: Obstetrics and Gynecology

## 2023-08-19 DIAGNOSIS — R1030 Lower abdominal pain, unspecified: Secondary | ICD-10-CM | POA: Diagnosis not present

## 2023-08-19 DIAGNOSIS — O0990 Supervision of high risk pregnancy, unspecified, unspecified trimester: Principal | ICD-10-CM

## 2023-08-19 DIAGNOSIS — O26892 Other specified pregnancy related conditions, second trimester: Principal | ICD-10-CM | POA: Insufficient documentation

## 2023-08-19 DIAGNOSIS — O10012 Pre-existing essential hypertension complicating pregnancy, second trimester: Secondary | ICD-10-CM | POA: Diagnosis not present

## 2023-08-19 DIAGNOSIS — O2342 Unspecified infection of urinary tract in pregnancy, second trimester: Secondary | ICD-10-CM | POA: Diagnosis not present

## 2023-08-19 DIAGNOSIS — O26899 Other specified pregnancy related conditions, unspecified trimester: Secondary | ICD-10-CM | POA: Diagnosis present

## 2023-08-19 DIAGNOSIS — Z3A20 20 weeks gestation of pregnancy: Secondary | ICD-10-CM | POA: Diagnosis not present

## 2023-08-19 LAB — WET PREP, GENITAL
Clue Cells Wet Prep HPF POC: NONE SEEN
Sperm: NONE SEEN
Trich, Wet Prep: NONE SEEN
WBC, Wet Prep HPF POC: >=10 — AB (ref <10)
Yeast Wet Prep HPF POC: NONE SEEN

## 2023-08-19 LAB — URINALYSIS, ROUTINE W REFLEX MICROSCOPIC
Bacteria, UA: NONE SEEN
Bilirubin Urine: NEGATIVE
Glucose, UA: NEGATIVE mg/dL
Ketones, ur: NEGATIVE mg/dL
Nitrite: NEGATIVE
Protein, ur: 100 mg/dL — AB
Specific Gravity, Urine: 1.026 (ref 1.005–1.030)
pH: 5 (ref 5.0–8.0)

## 2023-08-19 MED ORDER — NITROFURANTOIN MONOHYD MACRO 100 MG PO CAPS: 100.0000 mg | ORAL_CAPSULE | Freq: Two times a day (BID) | ORAL | 1 refills | Status: DC

## 2023-08-19 MED ORDER — ACETAMINOPHEN 500 MG PO TABS
1000.0000 mg | ORAL_TABLET | Freq: Four times a day (QID) | ORAL | Status: DC | PRN
Start: 2023-08-19 — End: 2023-11-10

## 2023-08-19 MED ORDER — PHENAZOPYRIDINE HCL 100 MG PO TABS: 100.0000 mg | ORAL_TABLET | Freq: Three times a day (TID) | ORAL | 0 refills | Status: DC

## 2023-08-19 MED ORDER — PHENAZOPYRIDINE HCL 100 MG PO TABS
100.0000 mg | ORAL_TABLET | Freq: Three times a day (TID) | ORAL | Status: DC
  Administered 2023-08-19: 100 mg via ORAL
  Filled 2023-08-19 (×2): qty 1

## 2023-08-19 MED ORDER — ACETAMINOPHEN 500 MG PO TABS: 1000.0000 mg | ORAL_TABLET | Freq: Four times a day (QID) | ORAL | Status: DC | PRN

## 2023-08-19 NOTE — OB Triage Note (Signed)
 Discharge instructions, labor precautions, and follow-up care reviewed with patient and significant other. All questions answered. Patient verbalized understanding. Discharged ambulatory off unit.

## 2023-08-19 NOTE — Discharge Summary (Signed)
 LABOR & DELIVERY OB TRIAGE NOTE  SUBJECTIVE  HPI Lori Gross is a 28 y.o. G3P2002 at [redacted]w[redacted]d who presents to Labor & Delivery for pelvic pain & concern for a UTI. She reports low abdominal pain beginning at 9am today, denies cramping or contractions, denies loss of fluid or vaginal bleeding. Endorses pain with urination & urgency.  Pregnancy complicated by STI in pregnancy, cHTN on labetalol -dose recently increased.  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   Microalbuminuria 10/08/2014   Obesity (BMI 30.0-34.9) 10/08/2014   Migraine without aura and responsive to treatment 10/08/2014     OB History     Gravida  3   Para  2   Term  2   Preterm  0   AB  0   Living  2      SAB  0   IAB      Ectopic  0   Multiple  0   Live Births  2        Obstetric Comments  Menstrual age: 54  Age 1st Pregnancy: 60          Scheduled Meds:  phenazopyridine   100 mg Oral TID WC   Continuous Infusions: PRN Meds:.acetaminophen   OBJECTIVE  BP (!) 143/83   Pulse 86   Temp 98.9 F (37.2 C) (Oral)   Resp 17   Ht 5\' 3"  (1.6 m)   Wt 92.1 kg   LMP 03/30/2023   BMI 35.96 kg/m   General: A&Ox4, NAD Heart: regular rate Lungs: normal effort Abdomen: gravid, suprapubic tenderness present, no CVA tenderness Cervical exam:   deferred  FHR on doppler: 135  Results for orders placed or performed during the hospital encounter of 08/19/23  (from the past 24 hours)  Urinalysis, Routine w reflex microscopic -Urine, Clean Catch     Status: Abnormal   Collection Time: 08/19/23 12:45 PM  Result Value Ref Range   Color, Urine AMBER (A) YELLOW   APPearance CLOUDY (A) CLEAR   Specific Gravity, Urine 1.026 1.005 - 1.030   pH 5.0 5.0 - 8.0   Glucose, UA NEGATIVE NEGATIVE mg/dL   Hgb urine dipstick SMALL (A) NEGATIVE   Bilirubin Urine NEGATIVE NEGATIVE   Ketones, ur NEGATIVE NEGATIVE mg/dL   Protein, ur 010 (A) NEGATIVE mg/dL   Nitrite NEGATIVE NEGATIVE   Leukocytes,Ua MODERATE (A) NEGATIVE   RBC / HPF 0-5 0 - 5 RBC/hpf   WBC, UA 21-50 0 - 5 WBC/hpf   Bacteria, UA NONE SEEN NONE SEEN   Squamous Epithelial / HPF 0-5 0 - 5 /HPF   Mucus PRESENT   Wet prep, genital     Status: Abnormal   Collection Time: 08/19/23  1:10 PM  Result Value Ref Range   Yeast Wet Prep HPF POC NONE SEEN NONE SEEN   Trich, Wet Prep NONE SEEN NONE SEEN   Clue Cells Wet Prep HPF POC NONE SEEN NONE SEEN   WBC, Wet Prep HPF POC >=10 (A) <10   Sperm NONE  SEEN     ASSESSMENT Impression  1) Pregnancy at [id number], [redacted]w[redacted]d, Estimated Date of Delivery: 01/04/24 2) Reassuring maternal/fetal status 3) UTI in pregnancy  PLAN Wet prep negative for infection, TOC collected yesterday at [facility] and pending. UA concerning for UTI, culture ordered. Presumptive treatment ordered, macrobid  100mg  po bid x7 days while awaiting culture results. Pyridium  100mg  po tid prn for spasms. Encouraged increased po fluid & frequent emptying. Return for fever, chills, worsening discomfort.  Forestine Igo, CNM 08/19/23  1:53 PM

## 2023-08-19 NOTE — OB Triage Note (Signed)
 Patient is a [id number] at [redacted]w[redacted]d who presents to unit c/o lower abdominal pressure that started around 0900 along with pain with urination and urgency. Reports +fetal movement, denies leakage of fluid and vaginal bleeding. Doppler FHT 135. Toco applied and assessing.

## 2023-08-20 ENCOUNTER — Encounter: Payer: Self-pay | Admitting: Obstetrics

## 2023-08-20 ENCOUNTER — Telehealth: Payer: Self-pay | Admitting: Family Medicine

## 2023-08-20 LAB — CERVICOVAGINAL ANCILLARY ONLY
Bacterial Vaginitis (gardnerella): NEGATIVE
Candida Glabrata: NEGATIVE
Candida Vaginitis: NEGATIVE
Chlamydia: NEGATIVE
Comment: NEGATIVE
Comment: NEGATIVE
Comment: NEGATIVE
Comment: NEGATIVE
Comment: NEGATIVE
Comment: NORMAL
Neisseria Gonorrhea: NEGATIVE
Trichomonas: NEGATIVE

## 2023-08-20 NOTE — Telephone Encounter (Signed)
Information regarding your request Prior Authorization Not Required 

## 2023-08-20 NOTE — Telephone Encounter (Signed)
 Nurtec 75mg   Key: [id number]

## 2023-08-21 LAB — URINE CULTURE: Culture: >=100000 — AB

## 2023-08-23 ENCOUNTER — Other Ambulatory Visit: Payer: Self-pay | Admitting: Certified Nurse Midwife

## 2023-08-23 ENCOUNTER — Encounter: Payer: Self-pay | Admitting: Certified Nurse Midwife

## 2023-08-23 DIAGNOSIS — N3000 Acute cystitis without hematuria: Secondary | ICD-10-CM

## 2023-08-23 MED ORDER — SULFAMETHOXAZOLE-TRIMETHOPRIM 800-160 MG PO TABS: 1.0000 | ORAL_TABLET | Freq: Two times a day (BID) | ORAL | 0 refills | Status: AC

## 2023-08-30 DIAGNOSIS — Z419 Encounter for procedure for purposes other than remedying health state, unspecified: Secondary | ICD-10-CM | POA: Diagnosis not present

## 2023-09-03 ENCOUNTER — Other Ambulatory Visit: Payer: Self-pay | Admitting: Advanced Practice Midwife

## 2023-09-08 ENCOUNTER — Ambulatory Visit
Admission: RE | Admit: 2023-09-08 | Discharge: 2023-09-08 | Disposition: A | Discharge status: Home / Self Care | Source: Ambulatory Visit | Attending: Obstetrics | Admitting: Obstetrics

## 2023-09-08 DIAGNOSIS — Z362 Encounter for other antenatal screening follow-up: Secondary | ICD-10-CM | POA: Diagnosis not present

## 2023-09-08 DIAGNOSIS — Z3A23 23 weeks gestation of pregnancy: Secondary | ICD-10-CM | POA: Diagnosis not present

## 2023-09-13 ENCOUNTER — Other Ambulatory Visit: Payer: Self-pay | Admitting: Family Medicine

## 2023-09-15 ENCOUNTER — Ambulatory Visit (INDEPENDENT_AMBULATORY_CARE_PROVIDER_SITE_OTHER): Admitting: Obstetrics

## 2023-09-15 VITALS — BP 128/83 | HR 78 | Wt 207.0 lb

## 2023-09-15 DIAGNOSIS — Z3A24 24 weeks gestation of pregnancy: Secondary | ICD-10-CM | POA: Diagnosis not present

## 2023-09-15 DIAGNOSIS — Z113 Encounter for screening for infections with a predominantly sexual mode of transmission: Secondary | ICD-10-CM

## 2023-09-15 DIAGNOSIS — Z131 Encounter for screening for diabetes mellitus: Secondary | ICD-10-CM

## 2023-09-15 DIAGNOSIS — D649 Anemia, unspecified: Secondary | ICD-10-CM

## 2023-09-15 DIAGNOSIS — O10012 Pre-existing essential hypertension complicating pregnancy, second trimester: Secondary | ICD-10-CM | POA: Diagnosis not present

## 2023-09-15 DIAGNOSIS — Z3689 Encounter for other specified antenatal screening: Secondary | ICD-10-CM

## 2023-09-15 DIAGNOSIS — O10919 Unspecified pre-existing hypertension complicating pregnancy, unspecified trimester: Secondary | ICD-10-CM

## 2023-09-15 NOTE — Progress Notes (Signed)
    Return Prenatal Note   Assessment/Plan   Plan  28 y.o. G3P2002 at [redacted]w[redacted]d presents for follow-up OB visit. Reviewed prenatal record including previous visit note.  Chronic hypertension affecting pregnancy -Taking labetalol  300 mg BID. Normotensive today -Growth scan ordered for 3rd trimetser  Supervision of high-risk pregnancy -Discussed 28-week labs and Rhogam at next visit and preparation for glucose test -Encouraged to add protein shakes or nutrient-dense snacks as needed -Reviewed kick counts and preterm labor warning signs. Instructed to call office or come to hospital with persistent headache, vision changes, regular contractions, leaking of fluid, decreased fetal movement or vaginal bleeding.     Orders Placed This Encounter  Procedures   US  OB Follow Up    Standing Status:   Future    Expected Date:   10/11/2023    Expiration Date:   09/09/2024    Reason for Exam (SYMPTOM  OR DIAGNOSIS REQUIRED):   growth    Preferred Imaging Location?:   [facility]   28 Weeks RH-Panel    Standing Status:   Future    Expected Date:   09/29/2023    Expiration Date:   09/14/2024   Return in about 4 weeks (around 10/13/2023).   Future Appointments  Date Time Provider Department Center  10/06/2023  2:30 PM [facility]  4 [facility]  10/13/2023  8:20 AM [facility] None  10/13/2023  8:55 AM Forestine Igo, CNM [facility] None    For next visit:  ROB with 28-week labs, TDaP, and Rhogam    Subjective   Lori Gross has been feeling tired and her appetite is poor. She is otherwise doing well. Considering an epidural for birth. She is interested in a contraceptive she can have before leaving the hospital (Depo, Nexplanon, POPs) or possibly an IUD at 6 weeks.   Movement: Present  Objective   Flow sheet Vitals: Pulse Rate: 78 BP: 128/83 Fundal Height: 25 cm Fetal Heart Rate (bpm): 140 Total weight gain: 2 lb (0.907 kg)  General Appearance  No acute distress, well  appearing, and well nourished Pulmonary   Normal work of breathing Neurologic   Alert and oriented to person, place, and time Psychiatric   Mood and affect within normal limits  Josue Nip, CNM 09/15/23 5:28 PM

## 2023-09-15 NOTE — Assessment & Plan Note (Signed)
-  Discussed 28-week labs and Rhogam at next visit and preparation for glucose test -Encouraged to add protein shakes or nutrient-dense snacks as needed -Reviewed kick counts and preterm labor warning signs. Instructed to call office or come to hospital with persistent headache, vision changes, regular contractions, leaking of fluid, decreased fetal movement or vaginal bleeding.

## 2023-09-15 NOTE — Assessment & Plan Note (Signed)
-  Taking labetalol  300 mg BID. Normotensive today -Growth scan ordered for 3rd trimetser

## 2023-09-20 DIAGNOSIS — R319 Hematuria, unspecified: Secondary | ICD-10-CM | POA: Diagnosis not present

## 2023-09-20 DIAGNOSIS — N390 Urinary tract infection, site not specified: Secondary | ICD-10-CM | POA: Diagnosis not present

## 2023-09-20 DIAGNOSIS — Z3A24 24 weeks gestation of pregnancy: Secondary | ICD-10-CM | POA: Diagnosis not present

## 2023-09-20 DIAGNOSIS — R300 Dysuria: Secondary | ICD-10-CM | POA: Diagnosis not present

## 2023-09-20 DIAGNOSIS — R350 Frequency of micturition: Secondary | ICD-10-CM | POA: Diagnosis not present

## 2023-09-30 DIAGNOSIS — Z419 Encounter for procedure for purposes other than remedying health state, unspecified: Secondary | ICD-10-CM | POA: Diagnosis not present

## 2023-10-06 ENCOUNTER — Ambulatory Visit: Admission: RE | Admit: 2023-10-06 | Source: Ambulatory Visit

## 2023-10-08 ENCOUNTER — Ambulatory Visit
Admission: RE | Admit: 2023-10-08 | Discharge: 2023-10-08 | Disposition: A | Discharge status: Home / Self Care | Source: Ambulatory Visit | Attending: Obstetrics | Admitting: Obstetrics

## 2023-10-08 DIAGNOSIS — Z3A27 27 weeks gestation of pregnancy: Secondary | ICD-10-CM | POA: Diagnosis not present

## 2023-10-08 DIAGNOSIS — O10919 Unspecified pre-existing hypertension complicating pregnancy, unspecified trimester: Secondary | ICD-10-CM | POA: Diagnosis not present

## 2023-10-08 DIAGNOSIS — Z3689 Encounter for other specified antenatal screening: Secondary | ICD-10-CM | POA: Diagnosis not present

## 2023-10-08 DIAGNOSIS — Z362 Encounter for other antenatal screening follow-up: Secondary | ICD-10-CM | POA: Diagnosis not present

## 2023-10-13 ENCOUNTER — Ambulatory Visit (INDEPENDENT_AMBULATORY_CARE_PROVIDER_SITE_OTHER): Admitting: Certified Nurse Midwife

## 2023-10-13 ENCOUNTER — Other Ambulatory Visit

## 2023-10-13 VITALS — BP 151/104 | HR 59 | Wt 219.0 lb

## 2023-10-13 DIAGNOSIS — O26899 Other specified pregnancy related conditions, unspecified trimester: Secondary | ICD-10-CM

## 2023-10-13 DIAGNOSIS — O2340 Unspecified infection of urinary tract in pregnancy, unspecified trimester: Secondary | ICD-10-CM

## 2023-10-13 DIAGNOSIS — Z131 Encounter for screening for diabetes mellitus: Secondary | ICD-10-CM | POA: Diagnosis not present

## 2023-10-13 DIAGNOSIS — O10013 Pre-existing essential hypertension complicating pregnancy, third trimester: Secondary | ICD-10-CM | POA: Diagnosis not present

## 2023-10-13 DIAGNOSIS — O10919 Unspecified pre-existing hypertension complicating pregnancy, unspecified trimester: Secondary | ICD-10-CM

## 2023-10-13 DIAGNOSIS — O360130 Maternal care for anti-D [Rh] antibodies, third trimester, not applicable or unspecified: Secondary | ICD-10-CM | POA: Diagnosis not present

## 2023-10-13 DIAGNOSIS — Z23 Encounter for immunization: Secondary | ICD-10-CM | POA: Diagnosis not present

## 2023-10-13 DIAGNOSIS — O0992 Supervision of high risk pregnancy, unspecified, second trimester: Secondary | ICD-10-CM

## 2023-10-13 DIAGNOSIS — Z3A28 28 weeks gestation of pregnancy: Secondary | ICD-10-CM | POA: Diagnosis not present

## 2023-10-13 DIAGNOSIS — Z113 Encounter for screening for infections with a predominantly sexual mode of transmission: Secondary | ICD-10-CM

## 2023-10-13 DIAGNOSIS — O0993 Supervision of high risk pregnancy, unspecified, third trimester: Secondary | ICD-10-CM

## 2023-10-13 DIAGNOSIS — D649 Anemia, unspecified: Secondary | ICD-10-CM | POA: Diagnosis not present

## 2023-10-13 MED ORDER — NIFEDIPINE ER OSMOTIC RELEASE 30 MG PO TB24: 30.0000 mg | ORAL_TABLET | Freq: Every day | ORAL | 1 refills | Status: DC

## 2023-10-13 MED ORDER — RHO D IMMUNE GLOBULIN 1500 UNIT/2ML IJ SOSY
300.0000 ug | PREFILLED_SYRINGE | Freq: Once | INTRAMUSCULAR | Status: AC
  Administered 2023-10-13: 300 ug via INTRAMUSCULAR

## 2023-10-13 MED ORDER — BLOOD PRESSURE KIT: 1.0000 | PACK | Freq: Every day | 0 refills | Status: DC

## 2023-10-13 NOTE — Assessment & Plan Note (Signed)
 -  Rhogam today

## 2023-10-13 NOTE — Progress Notes (Signed)
    Return Prenatal Note   Subjective   28 y.o. G3P2002 at [redacted]w[redacted]d presents for this follow-up prenatal visit.  Patient feeling well, active baby. Denies headache, visual changes or RUQ pain. Endorses off & on swelling to feet & hands. Taking labetalol  as prescribed. Patient reports: Movement: Present Contractions: Not present  Objective   Flow sheet Vitals: Pulse Rate: (!) 54 BP: (!) 145/92 Fundal Height: 28 cm Fetal Heart Rate (bpm): 140 Total weight gain: 14 lb (6.35 kg)  General Appearance  No acute distress, well appearing, and well nourished Pulmonary   Normal work of breathing Neurologic   Alert and oriented to person, place, and time Psychiatric   Mood and affect within normal limits  Assessment/Plan   Plan  28 y.o. [id number] at [redacted]w[redacted]d presents for follow-up OB visit. Reviewed prenatal record including previous visit note.  Chronic hypertension affecting pregnancy Continue labetalol  at 300mg  bid, add nifedipine XL 30mg  daily, return in one week for BP check. Pre-eclampsia labs today. Reinforced signs/symtpoms of pre-eclampsia & when to seek care. Growth scan ordered for ~71m, NSTs to begin at 32-34w.  Supervision of high-risk pregnancy Reviewed kick counts and preterm labor warning signs. Instructed to call office or come to hospital with persistent headache, vision changes, regular contractions, leaking of fluid, decreased fetal movement or vaginal bleeding.  Rh negative state in antepartum period Rhogam today.      Orders Placed This Encounter  Procedures   Urine Culture   US  OB Follow Up    Standing Status:   Future    Expected Date:   11/12/2023    Expiration Date:   10/12/2024    Reason for exam::   cHTN, growth    Preferred imaging location?:   [facility]   Tdap vaccine greater than or equal to 7yo IM   Comprehensive metabolic panel with GFR   Protein / creatinine ratio, urine   Return in 2 weeks (on 10/27/2023) for ROB.   Future Appointments  Date Time  Provider Department Center  10/20/2023  2:15 PM [facility] None  10/27/2023  2:55 PM Dominic, Jinnie Jansky, CNM [facility] None  11/10/2023  8:15 AM [facility] US  1 [facility]IMG None     For next visit:  continue with routine prenatal care     Harlene LITTIE Cisco, CNM  06/25/259:27 AM

## 2023-10-13 NOTE — Assessment & Plan Note (Signed)
 Reviewed kick counts and preterm labor warning signs. Instructed to call office or come to hospital with persistent headache, vision changes, regular contractions, leaking of fluid, decreased fetal movement or vaginal bleeding.

## 2023-10-13 NOTE — Patient Instructions (Addendum)
 Tdap (Tetanus, Diphtheria, Pertussis) Vaccine: What You Need to Know Many vaccine information statements are available in Spanish and other languages. See PromoAge.com.br. 1. Why get vaccinated? Tdap vaccine can prevent tetanus, diphtheria, and pertussis. Diphtheria and pertussis spread from person to person. Tetanus enters the body through cuts or wounds. TETANUS (T) causes painful stiffening of the muscles. Tetanus can lead to serious health problems, including being unable to open the mouth, having trouble swallowing and breathing, or death. DIPHTHERIA (D) can lead to difficulty breathing, heart failure, paralysis, or death. PERTUSSIS (aP), also known as "whooping cough," can cause uncontrollable, violent coughing that makes it hard to breathe, eat, or drink. Pertussis can be extremely serious especially in babies and young children, causing pneumonia, convulsions, brain damage, or death. In teens and adults, it can cause weight loss, loss of bladder control, passing out, and rib fractures from severe coughing. 2. Tdap vaccine Tdap is only for children 7 years and older, adolescents, and adults.  Adolescents should receive a single dose of Tdap, preferably at age 1 or 12 years. Pregnant people should get a dose of Tdap during every pregnancy, preferably during the early part of the third trimester, to help protect the newborn from pertussis. Infants are most at risk for severe, life-threatening complications from pertussis. Adults who have never received Tdap should get a dose of Tdap. Also, adults should receive a booster dose of either Tdap or Td (a different vaccine that protects against tetanus and diphtheria but not pertussis) every 10 years, or after 5 years in the case of a severe or dirty wound or burn. Tdap may be given at the same time as other vaccines. 3. Talk with your health care provider Tell your vaccine provider if the person getting the vaccine: Has had an allergic  reaction after a previous dose of any vaccine that protects against tetanus, diphtheria, or pertussis, or has any severe, life-threatening allergies Has had a coma, decreased level of consciousness, or prolonged seizures within 7 days after a previous dose of any pertussis vaccine (DTP, DTaP, or Tdap) Has seizures or another nervous system problem Has ever had Guillain-Barr Syndrome (also called "GBS") Has had severe pain or swelling after a previous dose of any vaccine that protects against tetanus or diphtheria In some cases, your health care provider may decide to postpone Tdap vaccination until a future visit. People with minor illnesses, such as a cold, may be vaccinated. People who are moderately or severely ill should usually wait until they recover before getting Tdap vaccine.  Your health care provider can give you more information. 4. Risks of a vaccine reaction Pain, redness, or swelling where the shot was given, mild fever, headache, feeling tired, and nausea, vomiting, diarrhea, or stomachache sometimes happen after Tdap vaccination. People sometimes faint after medical procedures, including vaccination. Tell your provider if you feel dizzy or have vision changes or ringing in the ears.  As with any medicine, there is a very remote chance of a vaccine causing a severe allergic reaction, other serious injury, or death. 5. What if there is a serious problem? An allergic reaction could occur after the vaccinated person leaves the clinic. If you see signs of a severe allergic reaction (hives, swelling of the face and throat, difficulty breathing, a fast heartbeat, dizziness, or weakness), call 9-1-1 and get the person to the nearest hospital. For other signs that concern you, call your health care provider.  Adverse reactions should be reported to the Vaccine Adverse Event Reporting  System (VAERS). Your health care provider will usually file this report, or you can do it yourself. Visit the  VAERS website at www.vaers.LAgents.no or call 581-173-2486. VAERS is only for reporting reactions, and VAERS staff members do not give medical advice. 6. The National Vaccine Injury Compensation Program The [employer] Vaccine Injury Compensation Program (VICP) is a federal program that was created to compensate people who may have been injured by certain vaccines. Claims regarding alleged injury or death due to vaccination have a time limit for filing, which may be as short as two years. Visit the VICP website at SpiritualWord.at or call 807-029-5188 to learn about the program and about filing a claim. 7. How can I learn more? Ask your health care provider. Call your local or state health department. Visit the website of the Food and Drug Administration (FDA) for vaccine package inserts and additional information at FinderList.no. Contact the Centers for Disease Control and Prevention (CDC): Call 873-355-8522 (1-800-CDC-INFO) or Visit CDC's website at PicCapture.uy. Source: CDC Vaccine Information Statement Tdap (Tetanus, Diphtheria, Pertussis) Vaccine (11/24/2019) This same material is available at FootballExhibition.com.br for no charge. This information is not intended to replace advice given to you by your health care provider. Make sure you discuss any questions you have with your health care provider. Document Revised: 07/22/2022 Document Reviewed: 05/22/2022 Elsevier Patient Education  2024 [employer].   Third Trimester of Pregnancy  The third trimester of pregnancy is from week 28 through week 40. This is months 7 through 9. The third trimester is a time when your baby is growing fast. Body changes during your third trimester Your body continues to change during this time. The changes usually go away after your baby is born. Physical changes You will continue to gain weight. You may get stretch marks on your hips, belly, and breasts. Your  breasts will keep growing and may hurt. A yellow fluid (colostrum) may leak from your breasts. This is the first milk you're making for your baby. Your hair may grow faster and get thicker. In some cases, you may get hair loss. Your belly button may stick out. You may have more swelling in your hands, face, or ankles. Health changes You may have heartburn. You may feel short of breath. This is caused by the uterus that is now bigger. You may have more aches in the pelvis, back, or thighs. You may have more tingling or numbness in your hands, arms, and legs. You may pee more often. You may have trouble pooping (constipation) or swollen veins in the butt that can itch or get painful (hemorrhoids). Other changes You may have more problems sleeping. You may notice the baby moving lower in your belly (dropping). You may have more fluid coming from your vagina. Your joints may feel loose, and you may have pain around your pelvic bone. Follow these instructions at home: Medicines Take medicines only as told by your health care provider. Some medicines are not safe during pregnancy. Your provider may change the medicines that you take. Do not take any medicines unless told to by your provider. Take a prenatal vitamin that has at least 600 micrograms (mcg) of folic acid. Do not use herbal medicines, illegal drugs, or medicines that are not approved by your provider. Eating and drinking While you're pregnant your body needs additional nutrition to help support your growing baby. Talk with your provider about your nutritional needs. Activity Most women are able to exercise regularly during pregnancy. Exercise routines may need to  change at the end of your pregnancy. Talk to your provider about your activities and exercise routine. Relieving pain and discomfort Rest often with your legs raised if you have leg cramps or low back pain. Take warm sitz baths to soothe pain from hemorrhoids. Use  hemorrhoid cream if your provider says it's okay. Wear a good, supportive bra if your breasts hurt. Do not use hot tubs, steam rooms, or saunas. Do not douche. Do not use tampons or scented pads. Safety Talk to your provider before traveling far distances. Wear your seatbelt at all times when you're in a car. Talk to your provider if someone hits you, hurts you, or yells at you. Preparing for birth To prepare for your baby: Take childbirth and breastfeeding classes. Visit the hospital and tour the maternity area. Buy a rear-facing car seat. Learn how to install it in your car. General instructions Avoid cat litter boxes and soil used by cats. These things carry germs that can cause harm to your pregnancy and your baby. Do not drink alcohol, smoke, vape, or use products with nicotine or tobacco in them. If you need help quitting, talk with your provider. Keep all follow-up visits for your third trimester. Your provider will do more exams and tests during this trimester. Write down your questions. Take them to your prenatal visits. Your provider also will: Talk with you about your overall health. Give you advice or refer you to specialists who can help with different needs, including: Mental health and counseling. Foods and healthy eating. Ask for help if you need help with food. Where to find more information American Pregnancy Association: americanpregnancy.org [employer]stetricians and Gynecologists: acog.org Office on [employer] Health: TravelLesson.ca Contact a health care provider if: You have a headache that does not go away when you take medicine. You have any of these problems: You can't eat or drink. You have nausea and vomiting. You have watery poop (diarrhea) for 2 days or more. You have pain when you pee, or your pee smells bad. You have been sick for 2 days or more and aren't getting better. Contact your provider right away if: You have any of these coming from  your vagina: Abnormal discharge. Bad-smelling fluid. Bleeding. Your baby is moving less than usual. You have signs of labor: You have any contractions, belly cramping, or have pain in your pelvis or lower back before 37 weeks of pregnancy (preterm labor). You have regular contractions that are less than 5 minutes apart. Your water breaks. You have symptoms of high blood pressure or preeclampsia. These include: A severe, throbbing headache that does not go away. Sudden or extreme swelling of your face, hands, legs, or feet. Vision problems: You see spots. You have blurry vision. Your eyes are sensitive to light. If you can't reach your provider, go to an urgent care or emergency room. Get help right away if: You faint, become confused, or can't think clearly. You have chest pain or trouble breathing. You have any kind of injury, such as from a fall or a car crash. These symptoms may be an emergency. Call 911 right away. Do not wait to see if the symptoms will go away. Do not drive yourself to the hospital. This information is not intended to replace advice given to you by your health care provider. Make sure you discuss any questions you have with your health care provider. Document Revised: 01/07/2023 Document Reviewed: 08/07/2022 Elsevier Patient Education  2024 [employer].

## 2023-10-13 NOTE — Assessment & Plan Note (Addendum)
 Continue labetalol  at 300mg  bid, add nifedipine XL 30mg  daily, return in one week for BP check. Pre-eclampsia labs today. Reinforced signs/symtpoms of pre-eclampsia & when to seek care. Growth scan ordered for ~18m, NSTs to begin at 32-34w.

## 2023-10-14 ENCOUNTER — Other Ambulatory Visit: Payer: Self-pay | Admitting: Obstetrics

## 2023-10-14 ENCOUNTER — Ambulatory Visit: Payer: Self-pay | Admitting: Obstetrics

## 2023-10-14 DIAGNOSIS — O99013 Anemia complicating pregnancy, third trimester: Secondary | ICD-10-CM

## 2023-10-14 HISTORY — DX: Anemia complicating pregnancy, third trimester: O99.013

## 2023-10-14 LAB — 28 WEEKS RH-PANEL
Antibody Screen: NEGATIVE
Basophils Absolute: 0 10*3/uL (ref 0.0–0.2)
Basos: 0 %
EOS (ABSOLUTE): 0.1 10*3/uL (ref 0.0–0.4)
Eos: 1 %
Gestational Diabetes Screen: 109 mg/dL (ref 70–139)
HIV Screen 4th Generation wRfx: NONREACTIVE
Hematocrit: 30.6 % — ABNORMAL LOW (ref 34.0–46.6)
Hemoglobin: 10.3 g/dL — ABNORMAL LOW (ref 11.1–15.9)
Immature Grans (Abs): 0.1 10*3/uL (ref 0.0–0.1)
Immature Granulocytes: 1 %
Lymphocytes Absolute: 1.6 10*3/uL (ref 0.7–3.1)
Lymphs: 20 %
MCH: 36.3 pg — ABNORMAL HIGH (ref 26.6–33.0)
MCHC: 33.7 g/dL (ref 31.5–35.7)
MCV: 108 fL — ABNORMAL HIGH (ref 79–97)
Monocytes Absolute: 0.3 10*3/uL (ref 0.1–0.9)
Monocytes: 4 %
Neutrophils Absolute: 6 10*3/uL (ref 1.4–7.0)
Neutrophils: 74 %
Platelets: 236 10*3/uL (ref 150–450)
RBC: 2.84 x10E6/uL — ABNORMAL LOW (ref 3.77–5.28)
RDW: 13.6 % (ref 11.7–15.4)
RPR Ser Ql: NONREACTIVE
WBC: 8.1 10*3/uL (ref 3.4–10.8)

## 2023-10-14 LAB — COMPREHENSIVE METABOLIC PANEL WITH GFR
ALT: 5 IU/L (ref 0–32)
AST: 12 IU/L (ref 0–40)
Albumin: 3.6 g/dL — ABNORMAL LOW (ref 4.0–5.0)
Alkaline Phosphatase: 93 IU/L (ref 44–121)
BUN/Creatinine Ratio: 12 (ref 9–23)
BUN: 7 mg/dL (ref 6–20)
Bilirubin Total: <0.2 mg/dL (ref 0.0–1.2)
CO2: 16 mmol/L — ABNORMAL LOW (ref 20–29)
Calcium: 8.8 mg/dL (ref 8.7–10.2)
Chloride: 106 mmol/L (ref 96–106)
Creatinine, Ser: 0.57 mg/dL (ref 0.57–1.00)
Globulin, Total: 2.4 g/dL (ref 1.5–4.5)
Glucose: 100 mg/dL — ABNORMAL HIGH (ref 70–99)
Potassium: 3.8 mmol/L (ref 3.5–5.2)
Sodium: 138 mmol/L (ref 134–144)
Total Protein: 6 g/dL (ref 6.0–8.5)
eGFR: 126 mL/min/{1.73_m2} (ref >59)

## 2023-10-14 LAB — PROTEIN / CREATININE RATIO, URINE
Creatinine, Urine: 175.9 mg/dL
Protein, Ur: 26 mg/dL
Protein/Creat Ratio: 148 mg/g{creat} (ref 0–200)

## 2023-10-14 MED ORDER — ACCRUFER 30 MG PO CAPS: 1.0000 | ORAL_CAPSULE | Freq: Two times a day (BID) | ORAL | 4 refills | Status: DC

## 2023-10-14 NOTE — Progress Notes (Signed)
-  Rx sent for Accrufer

## 2023-10-15 LAB — URINE CULTURE

## 2023-10-20 ENCOUNTER — Ambulatory Visit (INDEPENDENT_AMBULATORY_CARE_PROVIDER_SITE_OTHER)

## 2023-10-20 VITALS — BP 125/86 | HR 76 | Wt 214.8 lb

## 2023-10-20 DIAGNOSIS — Z0130 Encounter for examination of blood pressure without abnormal findings: Secondary | ICD-10-CM

## 2023-10-20 DIAGNOSIS — Z3A29 29 weeks gestation of pregnancy: Secondary | ICD-10-CM

## 2023-10-20 DIAGNOSIS — O10919 Unspecified pre-existing hypertension complicating pregnancy, unspecified trimester: Secondary | ICD-10-CM

## 2023-10-20 NOTE — Patient Instructions (Signed)
 High Blood Pressure During Pregnancy High blood pressure, or hypertension, is when the blood in your body moves with so much force that it could affect your health. It can cause problems for you and your baby. Three types of high blood pressure that can happen during pregnancy. They are: Chronic high blood pressure. This is when you've had high blood pressure for a while, even before getting pregnant. It doesn't go away after your baby is born. Gestational high blood pressure. This type starts after you're [redacted] weeks pregnant and usually goes away once your baby is born. Postpartum high blood pressure. This type is most common within 1 to 2 days after delivery, but it can also happen later -- even up to 12 weeks or more after pregnancy. It can be: High blood pressure that you had before your baby was born and continues after delivery. High blood pressure that starts after you've given birth. If your blood pressure gets really high, it's an emergency. You need to be treated right away. How does high blood pressure affect me? If you had blood pressure problems during pregnancy, you're more likely to get this condition after giving birth. High blood pressure can even happen 12 weeks or more after your baby is born. You may also: Get it when you are pregnant next time. Get it later in life. In some cases, this condition can cause serious problems, such as: Heart problems, such as stroke or heart attack. Injury to kidneys, lungs, or liver. Pre-eclampsia. HELLP syndrome. Seizures. Problems with the placenta. How does high blood pressure affect my baby? Your baby may: Be born early. Have a low birth weight. Have problems during labor. This may mean a C-section could be needed quickly. What are the risks for high blood pressure during pregnancy? You're more likely to get high blood pressure during pregnancy if: You had high blood pressure during a past pregnancy. You're overweight. You're 35 years  or older. You're pregnant for the first time. You're pregnant with more than one baby. You used a fertility method, such as IVF, to get pregnant. You have other problems, such as diabetes, kidney disease, or lupus. What can I do to lower my risk?  Keep a healthy weight. Eat a healthy diet. If you have long-term (chronic) conditions, have them treated before you become pregnant. How is this condition treated? Treatment depends on the type of high blood pressure you have and how serious it is. If you have high blood pressure, your health care provider may give you medicine to treat it and also to lessen risks to you and the baby. If you have very bad high blood pressure, you may need to stay in the hospital for treatment. If your condition gets worse, your baby may need to be born early. If you were taking medicine for your blood pressure before you got pregnant, talk with your provider. You may need to change the medicine during pregnancy if it is not safe for your baby. Follow these instructions at home: Eating and drinking Drink more fluids as told. Avoid caffeine. Lifestyle Do not use alcohol or drugs. Do not smoke, vape, or use nicotine or tobacco. Avoid stress as much as you can. Rest and get plenty of sleep. Get regular exercise. This can help to lower your blood pressure. Ask your health care provider what kinds of exercise are safe for you. General instructions Take your medicines only as told. Keep all follow-up visits. Your provider will check your blood pressure and  make sure that you and your baby are healthy. Contact a health care provider if: Your baby is not moving as much as usual. You feel very tired. You feel faint or dizzy. You throw up, or feel like you may throw up. You have cramping in your belly or have pain in your hips or lower back. You have spotting or bleeding, or you leak fluid from your vagina. Get help right away if: You have chest pain or trouble  breathing. You faint, have a seizure, or cannot think clearly. You have symptoms of serious problems, such as: A headache that doesn't go away when you take medicine. Very bad and sudden swelling of your face, hands, legs, or feet. Vision problems, such as: Seeing spots. Blurry vision. Being sensitive to light. These symptoms may be an emergency. Call 911 right away. Do not wait to see if the symptoms will go away. Do not drive yourself to the hospital. This information is not intended to replace advice given to you by your health care provider. Make sure you discuss any questions you have with your health care provider. Document Revised: 02/16/2023 Document Reviewed: 09/29/2022 Elsevier Patient Education  2024 [employer].

## 2023-10-20 NOTE — Progress Notes (Signed)
    NURSE VISIT NOTE  Subjective:    Patient ID: Lori Gross, female    DOB: Oct 06, 1995, 28 y.o.   MRN: [id number]  HPI  Patient is a 28 y.o. G10P2002 female who presents for BP check per order from Harlene Cisco, CNM. Per Harlene on 10/13/23: Chronic hypertension affecting pregnancy Continue labetalol  at 300mg  bid, add nifedipine  XL 30mg  daily, return in one week for BP check. Pre-eclampsia labs today. Reinforced signs/symtpoms of pre-eclampsia & when to seek care. Growth scan ordered for ~30m, NSTs to begin at 32-34w.   Patient reports compliance with prescribed BP medications: yes Labetalol  300mg  twice daily and Procardia  30mg  daily Last dose of BP medication: 10/20/23 at 11AM  BP Readings from Last 3 Encounters:  10/20/23 125/86  10/13/23 (!) 151/104  09/15/23 128/83   Pulse Readings from Last 3 Encounters:  10/20/23 76  10/13/23 (!) 59  09/15/23 78    Objective:    BP 125/86   Pulse 76   Wt 214 lb 12.8 oz (97.4 kg)   LMP 03/30/2023   BMI 38.05 kg/m   Assessment:   1. Chronic hypertension affecting pregnancy   2. [redacted] weeks gestation of pregnancy      Plan:   Per Dr. Eleanor Canny, CNM:  Continue current treatment regimen. Continue to monitor blood pressure at home. Report any reading >140/90 or with any associated symptoms. Return to clinic as scheduled.  Patient verbalized understanding of instructions.   [name], CMA

## 2023-10-27 ENCOUNTER — Ambulatory Visit (INDEPENDENT_AMBULATORY_CARE_PROVIDER_SITE_OTHER): Admitting: Licensed Practical Nurse

## 2023-10-27 VITALS — BP 138/87 | HR 71 | Wt 218.3 lb

## 2023-10-27 DIAGNOSIS — O99013 Anemia complicating pregnancy, third trimester: Secondary | ICD-10-CM

## 2023-10-27 DIAGNOSIS — Z3A30 30 weeks gestation of pregnancy: Secondary | ICD-10-CM

## 2023-10-27 DIAGNOSIS — O0993 Supervision of high risk pregnancy, unspecified, third trimester: Secondary | ICD-10-CM | POA: Diagnosis not present

## 2023-10-27 DIAGNOSIS — D509 Iron deficiency anemia, unspecified: Secondary | ICD-10-CM | POA: Diagnosis not present

## 2023-10-27 DIAGNOSIS — O10913 Unspecified pre-existing hypertension complicating pregnancy, third trimester: Secondary | ICD-10-CM

## 2023-10-27 LAB — POCT URINALYSIS DIPSTICK OB
Appearance: NORMAL
Bilirubin, UA: NEGATIVE
Blood, UA: NEGATIVE
Glucose, UA: NEGATIVE
Ketones, UA: NEGATIVE
Leukocytes, UA: NEGATIVE
Nitrite, UA: NEGATIVE
Odor: NORMAL
Spec Grav, UA: 1.025 (ref 1.010–1.025)
Urobilinogen, UA: 0.2 U/dL
pH, UA: 5 (ref 5.0–8.0)

## 2023-10-27 MED ORDER — FERRIC MALTOL 30 MG PO CAPS: 1.0000 | ORAL_CAPSULE | Freq: Every day | ORAL | 6 refills | Status: DC

## 2023-10-27 MED ORDER — NIFEDIPINE ER OSMOTIC RELEASE 60 MG PO TB24: 60.0000 mg | ORAL_TABLET | Freq: Every day | ORAL | 6 refills | Status: DC

## 2023-10-27 MED ORDER — ACCRUFER 30 MG PO CAPS: 1.0000 | ORAL_CAPSULE | Freq: Every day | ORAL | 6 refills | Status: DC

## 2023-10-27 NOTE — Progress Notes (Signed)
    Return Prenatal Note   Subjective   28 y.o. G3P2002 at [redacted]w[redacted]d presents for this follow-up prenatal visit.  Patient here with boyfriend (FOB).  Patient reports: Not able to pick up iron pills from pharmacy d/t no stock.  Movement: Present Contractions: Not present  Objective   Flow sheet Vitals: Pulse Rate: 71 BP: 138/87 Fundal Height: 30 cm Fetal Heart Rate (bpm): 130 Total weight gain: 13 lb 4.8 oz (6.033 kg)  General Appearance  No acute distress, well appearing, and well nourished Pulmonary   Normal work of breathing Neurologic   Alert and oriented to person, place, and time Psychiatric   Mood and affect within normal limits  Assessment/Plan   Plan  28 y.o. [id number] at [redacted]w[redacted]d presents for follow-up OB visit. Reviewed prenatal record including previous visit note.  Supervision of high-risk pregnancy - BP elevated in clinic today. Reports not taking her BP at home. Taking her labetalol  and procardia  as scheduled. After consulting dr. Sarajane, plan to increase procardia  to 60mg  daily. Check UPT and PreE labs today. Order her a BP to have at home for monitoring. Discussed possible need for IOL if BP uncontrolled and/or PreE. Denies HA, vision changes, RUQ pain.  - Eating out a lot. Discussed healthy diet and exercise.  - Gave samples of ACCuFer today and ordered to new pharmacy.  -PTL signs, warning signs and fetal kick count reviewed - R/O in 2 weeks.       Orders Placed This Encounter  Procedures   US  Fetal BPP W/O Non Stress    Standing Status:   Future    Expected Date:   11/10/2023    Expiration Date:   10/26/2024    Reason for Exam (SYMPTOM  OR DIAGNOSIS REQUIRED):   CHTN    Preferred Imaging Location?:   Internal    Release to patient:   Immediate   Comprehensive metabolic panel with GFR   CBC w/Diff/Platelet   Protein / creatinine ratio, urine   POC Urinalysis Dipstick OB   Return in about 2 weeks (around 11/10/2023) for ROB after Growth/BPP.   Future  Appointments  Date Time Provider Department Center  11/10/2023  8:15 AM [facility] US  1 AOB-[facility]G None  11/10/2023 10:35 AM Dominic, Jinnie Jansky, CNM [facility] None    For next visit:  continue with routine prenatal care     LYDIA [facility] CNM  07/09/255:17 PM

## 2023-10-27 NOTE — Patient Instructions (Signed)
 Third Trimester of Pregnancy  The third trimester of pregnancy is from week 28 through week 40. This is months 7 through 9. The third trimester is a time when your baby is growing fast. Body changes during your third trimester Your body continues to change during this time. The changes usually go away after your baby is born. Physical changes You will continue to gain weight. You may get stretch marks on your hips, belly, and breasts. Your breasts will keep growing and may hurt. A yellow fluid (colostrum) may leak from your breasts. This is the first milk you're making for your baby. Your hair may grow faster and get thicker. In some cases, you may get hair loss. Your belly button may stick out. You may have more swelling in your hands, face, or ankles. Health changes You may have heartburn. You may feel short of breath. This is caused by the uterus that is now bigger. You may have more aches in the pelvis, back, or thighs. You may have more tingling or numbness in your hands, arms, and legs. You may pee more often. You may have trouble pooping (constipation) or swollen veins in the butt that can itch or get painful (hemorrhoids). Other changes You may have more problems sleeping. You may notice the baby moving lower in your belly (dropping). You may have more fluid coming from your vagina. Your joints may feel loose, and you may have pain around your pelvic bone. Follow these instructions at home: Medicines Take medicines only as told by your health care provider. Some medicines are not safe during pregnancy. Your provider may change the medicines that you take. Do not take any medicines unless told to by your provider. Take a prenatal vitamin that has at least 600 micrograms (mcg) of folic acid. Do not use herbal medicines, illegal drugs, or medicines that are not approved by your provider. Eating and drinking While you're pregnant your body needs additional nutrition to help  support your growing baby. Talk with your provider about your nutritional needs. Activity Most women are able to exercise regularly during pregnancy. Exercise routines may need to change at the end of your pregnancy. Talk to your provider about your activities and exercise routine. Relieving pain and discomfort Rest often with your legs raised if you have leg cramps or low back pain. Take warm sitz baths to soothe pain from hemorrhoids. Use hemorrhoid cream if your provider says it's okay. Wear a good, supportive bra if your breasts hurt. Do not use hot tubs, steam rooms, or saunas. Do not douche. Do not use tampons or scented pads. Safety Talk to your provider before traveling far distances. Wear your seatbelt at all times when you're in a car. Talk to your provider if someone hits you, hurts you, or yells at you. Preparing for birth To prepare for your baby: Take childbirth and breastfeeding classes. Visit the hospital and tour the maternity area. Buy a rear-facing car seat. Learn how to install it in your car. General instructions Avoid cat litter boxes and soil used by cats. These things carry germs that can cause harm to your pregnancy and your baby. Do not drink alcohol, smoke, vape, or use products with nicotine or tobacco in them. If you need help quitting, talk with your provider. Keep all follow-up visits for your third trimester. Your provider will do more exams and tests during this trimester. Write down your questions. Take them to your prenatal visits. Your provider also will: Talk with you about  your overall health. Give you advice or refer you to specialists who can help with different needs, including: Mental health and counseling. Foods and healthy eating. Ask for help if you need help with food. Where to find more information American Pregnancy Association: americanpregnancy.org [employer]: acog.org Office on [employer] Health:  [employer] Contact a health care provider if: You have a headache that does not go away when you take medicine. You have any of these problems: You can't eat or drink. You have nausea and vomiting. You have watery poop (diarrhea) for 2 days or more. You have pain when you pee, or your pee smells bad. You have been sick for 2 days or more and aren't getting better. Contact your provider right away if: You have any of these coming from your vagina: Abnormal discharge. Bad-smelling fluid. Bleeding. Your baby is moving less than usual. You have signs of labor: You have any contractions, belly cramping, or have pain in your pelvis or lower back before 37 weeks of pregnancy (preterm labor). You have regular contractions that are less than 5 minutes apart. Your water breaks. You have symptoms of high blood pressure or preeclampsia. These include: A severe, throbbing headache that does not go away. Sudden or extreme swelling of your face, hands, legs, or feet. Vision problems: You see spots. You have blurry vision. Your eyes are sensitive to light. If you can't reach your provider, go to an urgent care or emergency room. Get help right away if: You faint, become confused, or can't think clearly. You have chest pain or trouble breathing. You have any kind of injury, such as from a fall or a car crash. These symptoms may be an emergency. Call 911 right away. Do not wait to see if the symptoms will go away. Do not drive yourself to the hospital. This information is not intended to replace advice given to you by your health care provider. Make sure you discuss any questions you have with your health care provider. Document Revised: 01/07/2023 Document Reviewed: 08/07/2022 Elsevier Patient Education  2024 [employer].

## 2023-10-27 NOTE — Assessment & Plan Note (Signed)
-   BP elevated in clinic today. Reports not taking her BP at home. Taking her labetalol  and procardia  as scheduled. After consulting dr. Sarajane, plan to increase procardia  to 60mg  daily. Check UPT and PreE labs today. Order her a BP to have at home for monitoring. Discussed possible need for IOL if BP uncontrolled and/or PreE. Denies HA, vision changes, RUQ pain.  - Eating out a lot. Discussed healthy diet and exercise.  - Gave samples of ACCuFer today and ordered to new pharmacy.  -PTL signs, warning signs and fetal kick count reviewed - R/O in 2 weeks.

## 2023-10-28 LAB — COMPREHENSIVE METABOLIC PANEL WITH GFR
ALT: 9 IU/L (ref 0–32)
AST: 14 IU/L (ref 0–40)
Albumin: 3.8 g/dL — ABNORMAL LOW (ref 4.0–5.0)
Alkaline Phosphatase: 112 IU/L (ref 44–121)
BUN/Creatinine Ratio: 13 (ref 9–23)
BUN: 10 mg/dL (ref 6–20)
Bilirubin Total: <0.2 mg/dL (ref 0.0–1.2)
CO2: 18 mmol/L — ABNORMAL LOW (ref 20–29)
Calcium: 9.9 mg/dL (ref 8.7–10.2)
Chloride: 103 mmol/L (ref 96–106)
Creatinine, Ser: 0.75 mg/dL (ref 0.57–1.00)
Globulin, Total: 2.6 g/dL (ref 1.5–4.5)
Glucose: 76 mg/dL (ref 70–99)
Potassium: 4.4 mmol/L (ref 3.5–5.2)
Sodium: 137 mmol/L (ref 134–144)
Total Protein: 6.4 g/dL (ref 6.0–8.5)
eGFR: 110 mL/min/1.73 (ref >59)

## 2023-10-28 LAB — CBC WITH DIFFERENTIAL/PLATELET
Basophils Absolute: 0 x10E3/uL (ref 0.0–0.2)
Basos: 0 %
EOS (ABSOLUTE): 0.1 x10E3/uL (ref 0.0–0.4)
Eos: 1 %
Hematocrit: 29.7 % — ABNORMAL LOW (ref 34.0–46.6)
Hemoglobin: 10.3 g/dL — ABNORMAL LOW (ref 11.1–15.9)
Immature Grans (Abs): 0.1 x10E3/uL (ref 0.0–0.1)
Immature Granulocytes: 1 %
Lymphocytes Absolute: 2.2 x10E3/uL (ref 0.7–3.1)
Lymphs: 24 %
MCH: 35.9 pg — ABNORMAL HIGH (ref 26.6–33.0)
MCHC: 34.7 g/dL (ref 31.5–35.7)
MCV: 104 fL — ABNORMAL HIGH (ref 79–97)
Monocytes Absolute: 0.5 x10E3/uL (ref 0.1–0.9)
Monocytes: 5 %
Neutrophils Absolute: 6.4 x10E3/uL (ref 1.4–7.0)
Neutrophils: 69 %
Platelets: 246 x10E3/uL (ref 150–450)
RBC: 2.87 x10E6/uL — ABNORMAL LOW (ref 3.77–5.28)
RDW: 13.3 % (ref 11.7–15.4)
WBC: 9.2 x10E3/uL (ref 3.4–10.8)

## 2023-10-28 LAB — PROTEIN / CREATININE RATIO, URINE
Creatinine, Urine: 286.3 mg/dL
Protein, Ur: 27.9 mg/dL
Protein/Creat Ratio: 97 mg/g{creat} (ref 0–200)

## 2023-10-29 ENCOUNTER — Ambulatory Visit: Payer: Self-pay | Admitting: Licensed Practical Nurse

## 2023-10-30 DIAGNOSIS — Z419 Encounter for procedure for purposes other than remedying health state, unspecified: Secondary | ICD-10-CM | POA: Diagnosis not present

## 2023-11-10 ENCOUNTER — Ambulatory Visit

## 2023-11-10 ENCOUNTER — Ambulatory Visit (INDEPENDENT_AMBULATORY_CARE_PROVIDER_SITE_OTHER): Admitting: Licensed Practical Nurse

## 2023-11-10 ENCOUNTER — Other Ambulatory Visit: Payer: Self-pay | Admitting: Licensed Practical Nurse

## 2023-11-10 ENCOUNTER — Encounter: Payer: Self-pay | Admitting: Licensed Practical Nurse

## 2023-11-10 VITALS — BP 138/86 | HR 84 | Wt 218.8 lb

## 2023-11-10 DIAGNOSIS — O0993 Supervision of high risk pregnancy, unspecified, third trimester: Secondary | ICD-10-CM | POA: Diagnosis not present

## 2023-11-10 DIAGNOSIS — O365990 Maternal care for other known or suspected poor fetal growth, unspecified trimester, not applicable or unspecified: Secondary | ICD-10-CM

## 2023-11-10 DIAGNOSIS — O0990 Supervision of high risk pregnancy, unspecified, unspecified trimester: Secondary | ICD-10-CM

## 2023-11-10 DIAGNOSIS — O10013 Pre-existing essential hypertension complicating pregnancy, third trimester: Secondary | ICD-10-CM

## 2023-11-10 DIAGNOSIS — Z3A32 32 weeks gestation of pregnancy: Secondary | ICD-10-CM

## 2023-11-10 DIAGNOSIS — O10919 Unspecified pre-existing hypertension complicating pregnancy, unspecified trimester: Secondary | ICD-10-CM

## 2023-11-10 DIAGNOSIS — O0992 Supervision of high risk pregnancy, unspecified, second trimester: Secondary | ICD-10-CM

## 2023-11-10 DIAGNOSIS — R350 Frequency of micturition: Secondary | ICD-10-CM | POA: Diagnosis not present

## 2023-11-10 DIAGNOSIS — O10913 Unspecified pre-existing hypertension complicating pregnancy, third trimester: Secondary | ICD-10-CM

## 2023-11-10 HISTORY — DX: Maternal care for other known or suspected poor fetal growth, unspecified trimester, not applicable or unspecified: O36.5990

## 2023-11-10 LAB — POCT URINALYSIS DIPSTICK
Bilirubin, UA: NEGATIVE
Blood, UA: NEGATIVE
Glucose, UA: NEGATIVE
Ketones, UA: NEGATIVE
Nitrite, UA: NEGATIVE
Protein, UA: NEGATIVE
Spec Grav, UA: 1.015 (ref 1.010–1.025)
Urobilinogen, UA: 0.2 U/dL
pH, UA: 6 (ref 5.0–8.0)

## 2023-11-10 NOTE — Progress Notes (Signed)
    Return Prenatal Note   Subjective   27 y.o. G3P2002 at [redacted]w[redacted]d presents for this follow-up prenatal visit.  Patient Here with her partner  Patient reports: She is concerned: she had an US  earlier today and was told her fluid is low and the baby is small. Pt reports she was born premature (her mother was not aware she was pregnant and went into spontaneous labor). Reviewed US  findings with pt and her partner, EFW 8%, AFI is 11.18 which is the low end of normal.  Pt reports she gets up frequently at night to urinate, she feel moist all of the time. Denies urinary sxs. SSE: negative pooling, Nitrazine and ferning. Physiologic discharge present.   Movement: Present Contractions: Not present  Objective   Flow sheet Vitals: Pulse Rate: 84 BP: 138/86 Fetal Heart Rate (bpm): 135 Total weight gain: 13 lb 12.8 oz (6.26 kg)  General Appearance  No acute distress, well appearing, and well nourished Pulmonary   Normal work of breathing Neurologic   Alert and oriented to person, place, and time Psychiatric   Mood and affect within normal limits  Assessment/Plan   Plan  28 y.o. [id number] at [redacted]w[redacted]d presents for follow-up OB visit. Reviewed prenatal record including previous visit note.  Supervision of high-risk pregnancy -Discussed plan, MFM referral placed, will need Weekly NST's starting at 34 weeks, most likely she will have scheduled doppler studies, Induction timing based on blood pressures and fetal assessments.  -check CBC at next visit  -warning signs reviewed      Orders Placed This Encounter  Procedures   Urine Culture   US  MFM OB DETAIL +14 WK    Standing Status:   Future    Expected Date:   11/17/2023    Expiration Date:   11/09/2024    Reason for Exam (SYMPTOM  OR DIAGNOSIS REQUIRED):   IUGR    Preferred Location:   [facility]   Return in about 2 weeks (around 11/24/2023) for ROB, NST.   Future Appointments  Date Time Provider Department Center  11/26/2023  1:15  PM AOB-NST ROOM [facility] None  11/26/2023  1:35 PM Lynda Bradley, CNM [facility] None    For next visit:  ROB with NST     JINNIE HERO [facility], CNM  11/10/2510:47 PM

## 2023-11-10 NOTE — Assessment & Plan Note (Addendum)
-  Discussed plan, MFM referral placed, will need Weekly NST's starting at 34 weeks, most likely she will have scheduled doppler studies, Induction timing based on blood pressures and fetal assessments.  -check CBC at next visit  -warning signs reviewed

## 2023-11-10 NOTE — Addendum Note (Signed)
 Addended by: DONELDA BURNARD CROME on: 11/10/2023 02:34 PM   Modules accepted: Orders

## 2023-11-12 LAB — URINE CULTURE

## 2023-11-13 ENCOUNTER — Telehealth: Payer: Self-pay | Admitting: Licensed Practical Nurse

## 2023-11-13 ENCOUNTER — Other Ambulatory Visit: Payer: Self-pay | Admitting: Licensed Practical Nurse

## 2023-11-13 ENCOUNTER — Ambulatory Visit: Payer: Self-pay | Admitting: Licensed Practical Nurse

## 2023-11-13 DIAGNOSIS — N390 Urinary tract infection, site not specified: Secondary | ICD-10-CM

## 2023-11-13 MED ORDER — AMOXICILLIN 500 MG PO CAPS: 500.0000 mg | ORAL_CAPSULE | Freq: Three times a day (TID) | ORAL | 2 refills | Status: DC

## 2023-11-13 NOTE — Telephone Encounter (Signed)
 Pt called back, discussed urine culture and treatment plan, recommend antibiotics in labor. Please see mychart message for link to more information on GBS. Jinnie Cookey, CNM  [facility] 11/13/2023 4:51 PM

## 2023-11-13 NOTE — Progress Notes (Signed)
 Pt reported increased frequency, urine culture shows GBS. PT called, LVM, mychart message sent. Script fro Amoxicillin  sent. Plan for intrapartum prophylaxis.  Jinnie Cookey, CNM  [facility] 11/13/23  4:14 PM '

## 2023-11-15 DIAGNOSIS — O99210 Obesity complicating pregnancy, unspecified trimester: Secondary | ICD-10-CM | POA: Insufficient documentation

## 2023-11-15 NOTE — Telephone Encounter (Signed)
 Spoke with patient to advised of 11/16/23 MFM appointment in [place] at 8 am. Patient confirmed.

## 2023-11-16 ENCOUNTER — Other Ambulatory Visit: Payer: Self-pay | Admitting: Licensed Practical Nurse

## 2023-11-16 ENCOUNTER — Other Ambulatory Visit: Payer: Self-pay | Admitting: *Deleted

## 2023-11-16 ENCOUNTER — Ambulatory Visit: Attending: Obstetrics and Gynecology | Admitting: Obstetrics

## 2023-11-16 ENCOUNTER — Other Ambulatory Visit

## 2023-11-16 VITALS — BP 151/97 | HR 73

## 2023-11-16 DIAGNOSIS — O365990 Maternal care for other known or suspected poor fetal growth, unspecified trimester, not applicable or unspecified: Secondary | ICD-10-CM

## 2023-11-16 DIAGNOSIS — O99210 Obesity complicating pregnancy, unspecified trimester: Secondary | ICD-10-CM

## 2023-11-16 DIAGNOSIS — O10013 Pre-existing essential hypertension complicating pregnancy, third trimester: Secondary | ICD-10-CM | POA: Diagnosis not present

## 2023-11-16 DIAGNOSIS — O365930 Maternal care for other known or suspected poor fetal growth, third trimester, not applicable or unspecified: Secondary | ICD-10-CM | POA: Diagnosis not present

## 2023-11-16 DIAGNOSIS — O10919 Unspecified pre-existing hypertension complicating pregnancy, unspecified trimester: Secondary | ICD-10-CM

## 2023-11-16 DIAGNOSIS — O365931 Maternal care for other known or suspected poor fetal growth, third trimester, fetus 1: Secondary | ICD-10-CM | POA: Diagnosis not present

## 2023-11-16 DIAGNOSIS — E669 Obesity, unspecified: Secondary | ICD-10-CM | POA: Diagnosis not present

## 2023-11-16 DIAGNOSIS — O10913 Unspecified pre-existing hypertension complicating pregnancy, third trimester: Secondary | ICD-10-CM | POA: Diagnosis not present

## 2023-11-16 DIAGNOSIS — O0990 Supervision of high risk pregnancy, unspecified, unspecified trimester: Secondary | ICD-10-CM

## 2023-11-16 DIAGNOSIS — Z3A33 33 weeks gestation of pregnancy: Secondary | ICD-10-CM

## 2023-11-16 DIAGNOSIS — O99213 Obesity complicating pregnancy, third trimester: Secondary | ICD-10-CM

## 2023-11-16 DIAGNOSIS — Z79899 Other long term (current) drug therapy: Secondary | ICD-10-CM | POA: Diagnosis not present

## 2023-11-16 NOTE — Progress Notes (Signed)
 MFM Consult Note  Lori Gross is currently at 33 weeks and 0 days.  She was seen due to chronic hypertension treated with both labetalol  300 mg twice a day and nifedipine  XL 60 mg daily.  IUGR was noted on a recent ultrasound performed in your office.    She had PIH labs drawn 3 weeks ago which were all within normal limits.  Her P/C ratio performed at that time did not indicate significant proteinuria.  The patient reports that she has had hypertension since age 59.  Her blood pressure today was 151/97.    She denies any signs or symptoms of preeclampsia and reports feeling fetal movements throughout the day.    She had a cell free DNA test drawn earlier in her pregnancy which indicated a low risk for trisomy 58, 58, and 13.  A female fetus is predicted.  On today's exam, the EFW of 3 pounds 6 ounces measures at the 1st percentile for her gestational age indicating severe IUGR.     The total AFI was 12.32 cm (within normal limits).  A BPP performed today was 8 out of 8.    Doppler studies of the umbilical arteries showed an elevated S/D ratio of 5.14 .  There were no signs of absent or reversed end-diastolic flow.    The views of the fetal anatomy were limited today due to her advanced gestational age.  There were no obvious fetal anomalies noted.    The following were discussed during today's consultation:  Chronic hypertension/severe IUGR  The patient was advised that her elevated blood pressures may have contributed to the fetal growth restriction noted today.  The increased risk of superimposed preeclampsia due to IUGR and chronic hypertension was discussed.  Preeclampsia precautions were reviewed.  Fetal kick count instructions were reviewed today.  Due to severe IUGR and chronic hypertension, she should be seen for twice-weekly fetal testing and blood pressure checks.    We will see her once in our office each week for a BPP and umbilical artery Doppler study.  She should  also have an NST and blood pressure check performed in your office each week.  The patient understands that the goal for her delivery will be at around 37 weeks.    However, delivery prior to 37 weeks may be indicated should she develop preeclampsia, should her blood pressures be difficult to control, should her umbilical artery Doppler studies show absent or reversed end-diastolic flow, or at any time for nonreassuring fetal status.  She will return to our office in 1 week for another BPP and umbilical artery Doppler study.    Please schedule the patient for an NST and blood pressure check in your office either Thursday or Friday of this week.    The patient stated that she understood everything that was discussed with her and all of her questions were answered today.  A total of 45 minutes was spent counseling and coordinating the care for this patient.  Greater than 50% of the time was spent in direct face-to-face contact.

## 2023-11-18 NOTE — Progress Notes (Unsigned)
    Return Prenatal Note   Assessment/Plan   Plan  28 y.o. G3P2002 at [redacted]w[redacted]d presents for follow-up OB visit. Reviewed prenatal record including previous visit note.  No problem-specific Assessment & Plan notes found for this encounter.    No orders of the defined types were placed in this encounter.  No follow-ups on file.   Future Appointments  Date Time Provider Department Center  11/19/2023  3:15 PM AOB-NST ROOM [facility] None  11/19/2023  3:55 PM Justino Setter M, CNM [facility] None  11/26/2023  1:15 PM AOB-NST ROOM [facility] None  11/26/2023  1:35 PM Lynda Bradley, CNM [facility] None    For next visit:  {Return Prenatal Care:31737}    Subjective       Objective   Flow sheet Vitals:   Total weight gain: 13 lb 12.8 oz (6.26 kg)  General Appearance  No acute distress, well appearing, and well nourished Pulmonary   Normal work of breathing Neurologic   Alert and oriented to person, place, and time Psychiatric   Mood and affect within normal limits    Non-Stress Test Interpretation Indication: {MFM NST INDICATIONS:20869}             Setter Justino, CNM 11/18/23 12:22 PM

## 2023-11-18 NOTE — Patient Instructions (Signed)
 Fetal Growth Restriction: What to Know  Fetal growth restriction (FGR) is when a baby is not growing normally during pregnancy. A baby with FGR is smaller than they should be and may weigh less than normal at birth. Babies with FGR are at higher risk for early delivery. They may also need more care than usual after birth. What are the causes? Problems with the placenta or umbilical cord. This problem causes the fetus to get less oxygen or nutrition. Not enough food for the mother during pregnancy. Not enough weight gain for the mother during pregnancy. Smoking, drinking alcohol, and taking drugs. Some medicines. Problems that a baby may get before they're born (congenital). Problems that are passed in families (genetic). Being pregnant with twins or more. Infection. What increases the risk? You're older than age 30 or younger than age 64. You have certain medical conditions, such as: High blood pressure. Preeclampsia. Diabetes. Heart or kidney disease. Anemia. You live at a very high altitude. You have a history of FGR, or have a genetic disorder. You have a family history of FGR, or genetic disorders. You had treatments to help you get pregnant. What are the signs or symptoms? FGR does not cause many symptoms. Your health care provider may suspect FGR if your belly is not as big as it should be for the stage of your pregnancy. How is this diagnosed? FGR is diagnosed with physical exams and prenatal exams. Your provider may: Measure your uterus. Measure your baby using ultrasound. Do a test to check the fluid around the baby in the uterus. This will show any infection or problems that can develop before the baby is born. Do tests to check blood flow to the placenta and to the baby. How is this treated? Your provider will treat the cause of FGR. Your pregnancy will be closely watched. If your baby is not getting enough blood because of a problem with the placenta, your baby may  need to be delivered early. Follow these instructions at home: Medicines Take your medicines only as told. This includes vitamins and supplements. Ask your provider before you take or use any medicines, supplements, vitamins, eye drops, and creams. General instructions Eat a healthy diet. Work with your health care provider or a dietitian to make sure that: You are getting enough nutrients. You are gaining enough weight during your pregnancy. Rest as needed. Try to get at least 8 hours of sleep every night. Do not drink alcohol or use drugs. Do not smoke, vape, or use nicotine  or tobacco. Keep all follow-up visits. Your provider needs to check your health and your baby's growth. Contact a health care provider if: Your baby is moving less than usual. You have cramps in your belly or have pain in your hips or lower back. You have a sudden, sharp pain in the belly. You have a gush or trickle of fluid from your vagina and you cannot stop it. You have signs of infection, including a fever. You have bleeding from your vagina. You have symptoms of high blood pressure or preeclampsia. These include: A headache that doesn't go away when you take medicine. Sudden or extreme swelling of your face, hands, legs, or feet. Vision problems, such as: You see spots. You have blurry vision. You're sensitive to light. Get help right away if: You faint, have a seizure, or cannot think clearly. You have chest pain or trouble breathing. You have any of these symptoms and you're unable to reach your provider: Symptoms of  infection, including a fever. You have bleeding from your vagina. You have symptoms of high blood pressure or preeclampsia. These symptoms may be an emergency. Call 911 right away. Do not wait to see if the symptoms will go away. Do not drive yourself to the hospital. This information is not intended to replace advice given to you by your health care provider. Make sure you discuss any  questions you have with your health care provider. Document Revised: 09/07/2022 Document Reviewed: 09/07/2022 Elsevier Patient Education  2024 Else[employer] Inc[employer] Nonstress Test: What to Expect A nonstress test, also called an NST, is done during pregnancy to check your baby's heartbeat. The procedure can help to show if your baby is healthy. It may be done if: Your due date has passed. Your pregnancy is high risk. Your baby is moving less than normal. You've lost a previous pregnancy. Your baby is growing slowly. There's too much or too little fluid around your baby. The NST may be done in the third trimester to find out if it's best for your baby to be born early. During an NST, your baby's heartbeat is watched for at least 20 minutes. If the baby is healthy, the heart rate will go up when the baby moves and will return to normal when the baby rests. This should happen at least twice during the test. Tell a health care provider about: Any allergies you have. Any medical problems you have. All medicines you take. These include vitamins, herbs, eye drops, and creams. Any surgeries you've had. Any past pregnancies you've had. What are the risks? There are no risks to you or your baby from a nonstress test. This procedure shouldn't be painful or uncomfortable. What happens before? Eat a meal right before the test or as told by your health care team. Food may help the baby to move. Use the restroom right before the test. What happens during a nonstress test?  Two monitors will be placed on your belly. One will check your baby's heart rate, and the other will check for contractions. You may be asked to lie down on your side or to sit up. You may be given a button to press when you feel your baby move. If your baby seems to be sleeping, you may be asked to drink some juice or soda, eat a snack, or change positions. These steps may vary. Ask what you can expect. What can I expect after? Your  team will talk with you about the results and tell you the next steps. If your team gave you any diet or activity instructions, make sure to follow them. Keep all follow-up visits. This is important to check on your health and the health of your baby. This information is not intended to replace advice given to you by your health care provider. Make sure you discuss any questions you have with your health care provider. Document Revised: 04/01/2023 Document Reviewed: 04/01/2023 Elsevier Patient Education  2025 [employer].

## 2023-11-19 ENCOUNTER — Other Ambulatory Visit

## 2023-11-19 ENCOUNTER — Inpatient Hospital Stay
Admission: EM | Admit: 2023-11-19 | Discharge: 2023-11-24 | DRG: 786 | Disposition: A | Discharge status: Home / Self Care | Attending: Certified Nurse Midwife | Admitting: Certified Nurse Midwife

## 2023-11-19 ENCOUNTER — Other Ambulatory Visit: Payer: Self-pay

## 2023-11-19 ENCOUNTER — Ambulatory Visit (INDEPENDENT_AMBULATORY_CARE_PROVIDER_SITE_OTHER): Admitting: Certified Nurse Midwife

## 2023-11-19 ENCOUNTER — Encounter: Payer: Self-pay | Admitting: Obstetrics and Gynecology

## 2023-11-19 VITALS — BP 135/88 | HR 101 | Wt 217.7 lb

## 2023-11-19 DIAGNOSIS — O1092 Unspecified pre-existing hypertension complicating childbirth: Secondary | ICD-10-CM | POA: Diagnosis present

## 2023-11-19 DIAGNOSIS — O368330 Maternal care for abnormalities of the fetal heart rate or rhythm, third trimester, not applicable or unspecified: Secondary | ICD-10-CM | POA: Diagnosis not present

## 2023-11-19 DIAGNOSIS — D509 Iron deficiency anemia, unspecified: Secondary | ICD-10-CM

## 2023-11-19 DIAGNOSIS — O0990 Supervision of high risk pregnancy, unspecified, unspecified trimester: Principal | ICD-10-CM

## 2023-11-19 DIAGNOSIS — Z6791 Unspecified blood type, Rh negative: Secondary | ICD-10-CM

## 2023-11-19 DIAGNOSIS — O99013 Anemia complicating pregnancy, third trimester: Secondary | ICD-10-CM | POA: Diagnosis not present

## 2023-11-19 DIAGNOSIS — Z79899 Other long term (current) drug therapy: Secondary | ICD-10-CM

## 2023-11-19 DIAGNOSIS — O26893 Other specified pregnancy related conditions, third trimester: Secondary | ICD-10-CM | POA: Diagnosis not present

## 2023-11-19 DIAGNOSIS — O9081 Anemia of the puerperium: Secondary | ICD-10-CM | POA: Diagnosis not present

## 2023-11-19 DIAGNOSIS — Z3A33 33 weeks gestation of pregnancy: Secondary | ICD-10-CM

## 2023-11-19 DIAGNOSIS — Z8249 Family history of ischemic heart disease and other diseases of the circulatory system: Secondary | ICD-10-CM

## 2023-11-19 DIAGNOSIS — O365930 Maternal care for other known or suspected poor fetal growth, third trimester, not applicable or unspecified: Secondary | ICD-10-CM

## 2023-11-19 DIAGNOSIS — O113 Pre-existing hypertension with pre-eclampsia, third trimester: Secondary | ICD-10-CM | POA: Diagnosis not present

## 2023-11-19 DIAGNOSIS — D62 Acute posthemorrhagic anemia: Secondary | ICD-10-CM | POA: Diagnosis not present

## 2023-11-19 DIAGNOSIS — O365990 Maternal care for other known or suspected poor fetal growth, unspecified trimester, not applicable or unspecified: Secondary | ICD-10-CM

## 2023-11-19 DIAGNOSIS — O114 Pre-existing hypertension with pre-eclampsia, complicating childbirth: Secondary | ICD-10-CM | POA: Diagnosis not present

## 2023-11-19 DIAGNOSIS — Z833 Family history of diabetes mellitus: Secondary | ICD-10-CM | POA: Diagnosis not present

## 2023-11-19 DIAGNOSIS — O99214 Obesity complicating childbirth: Secondary | ICD-10-CM | POA: Diagnosis not present

## 2023-11-19 LAB — COMPREHENSIVE METABOLIC PANEL WITH GFR
ALT: 10 U/L (ref 0–44)
AST: 16 U/L (ref 15–41)
Albumin: 3 g/dL — ABNORMAL LOW (ref 3.5–5.0)
Alkaline Phosphatase: 116 U/L (ref 38–126)
Anion gap: 10 (ref 5–15)
BUN: 9 mg/dL (ref 6–20)
CO2: 22 mmol/L (ref 22–32)
Calcium: 9.1 mg/dL (ref 8.9–10.3)
Chloride: 106 mmol/L (ref 98–111)
Creatinine, Ser: 0.71 mg/dL (ref 0.44–1.00)
GFR, Estimated: >60 mL/min (ref >60)
Glucose, Bld: 63 mg/dL — ABNORMAL LOW (ref 70–99)
Potassium: 4 mmol/L (ref 3.5–5.1)
Sodium: 138 mmol/L (ref 135–145)
Total Bilirubin: 0.4 mg/dL (ref 0.0–1.2)
Total Protein: 6.6 g/dL (ref 6.5–8.1)

## 2023-11-19 LAB — CBC
HCT: 30.4 % — ABNORMAL LOW (ref 36.0–46.0)
Hemoglobin: 10.7 g/dL — ABNORMAL LOW (ref 12.0–15.0)
MCH: 36.3 pg — ABNORMAL HIGH (ref 26.0–34.0)
MCHC: 35.2 g/dL (ref 30.0–36.0)
MCV: 103.1 fL — ABNORMAL HIGH (ref 80.0–100.0)
Platelets: 215 K/uL (ref 150–400)
RBC: 2.95 MIL/uL — ABNORMAL LOW (ref 3.87–5.11)
RDW: 13.2 % (ref 11.5–15.5)
WBC: 9.3 K/uL (ref 4.0–10.5)
nRBC: 0 % (ref 0.0–0.2)

## 2023-11-19 LAB — PROTEIN / CREATININE RATIO, URINE
Creatinine, Urine: 190 mg/dL
Protein Creatinine Ratio: 0.22 mg/mg{creat} — ABNORMAL HIGH (ref 0.00–0.15)
Total Protein, Urine: 42 mg/dL

## 2023-11-19 MED ORDER — LACTATED RINGERS IV SOLN: Freq: Once | INTRAVENOUS | Status: AC

## 2023-11-19 MED ORDER — LACTATED RINGERS IV SOLN: INTRAVENOUS | Status: AC

## 2023-11-19 MED ORDER — DOCUSATE SODIUM 100 MG PO CAPS
100.0000 mg | ORAL_CAPSULE | Freq: Every day | ORAL | Status: DC
  Administered 2023-11-19 – 2023-11-24 (×6): 100 mg via ORAL
  Filled 2023-11-19 (×6): qty 1

## 2023-11-19 MED ORDER — ACETAMINOPHEN 500 MG PO TABS
1000.0000 mg | ORAL_TABLET | Freq: Four times a day (QID) | ORAL | Status: DC | PRN
  Administered 2023-11-19 – 2023-11-20 (×2): 1000 mg via ORAL
  Administered 2023-11-20: 500 mg via ORAL
  Administered 2023-11-22 – 2023-11-24 (×3): 1000 mg via ORAL
  Filled 2023-11-19 (×6): qty 2

## 2023-11-19 MED ORDER — ZOLPIDEM TARTRATE 5 MG PO TABS: 5.0000 mg | ORAL_TABLET | Freq: Every evening | ORAL | Status: DC | PRN

## 2023-11-19 MED ORDER — NIFEDIPINE ER OSMOTIC RELEASE 30 MG PO TB24
60.0000 mg | ORAL_TABLET | Freq: Every day | ORAL | Status: DC
  Administered 2023-11-20 – 2023-11-22 (×3): 60 mg via ORAL
  Filled 2023-11-19 (×3): qty 2

## 2023-11-19 MED ORDER — LACTATED RINGERS IV SOLN: 125.0000 mL/h | INTRAVENOUS | Status: AC

## 2023-11-19 MED ORDER — LABETALOL HCL 100 MG PO TABS: 100.0000 mg | ORAL_TABLET | Freq: Two times a day (BID) | ORAL | Status: DC

## 2023-11-19 MED ORDER — PRENATAL MULTIVITAMIN CH
1.0000 | ORAL_TABLET | Freq: Every day | ORAL | Status: DC
  Administered 2023-11-20 – 2023-11-24 (×4): 1 via ORAL
  Filled 2023-11-19 (×4): qty 1

## 2023-11-19 MED ORDER — BETAMETHASONE SOD PHOS & ACET 6 (3-3) MG/ML IJ SUSP
12.0000 mg | Freq: Once | INTRAMUSCULAR | Status: AC
  Administered 2023-11-20: 12 mg via INTRAMUSCULAR
  Filled 2023-11-19: qty 5

## 2023-11-19 MED ORDER — CALCIUM CARBONATE ANTACID 500 MG PO CHEW: 2.0000 | CHEWABLE_TABLET | ORAL | Status: DC | PRN

## 2023-11-19 MED ORDER — BETAMETHASONE SOD PHOS & ACET 6 (3-3) MG/ML IJ SUSP
12.0000 mg | INTRAMUSCULAR | Status: AC
  Administered 2023-11-19: 12 mg via INTRAMUSCULAR
  Filled 2023-11-19: qty 5

## 2023-11-19 MED ORDER — LABETALOL HCL 200 MG PO TABS
300.0000 mg | ORAL_TABLET | Freq: Two times a day (BID) | ORAL | Status: DC
  Administered 2023-11-19 – 2023-11-24 (×8): 300 mg via ORAL
  Filled 2023-11-19: qty 1
  Filled 2023-11-19: qty 3
  Filled 2023-11-19 (×2): qty 1
  Filled 2023-11-19: qty 3
  Filled 2023-11-19 (×3): qty 1
  Filled 2023-11-19: qty 3

## 2023-11-19 NOTE — OB Triage Note (Addendum)
 Lori Gross 28 y.o. @[redacted]w[redacted]d  G3P2  presents to Labor & Delivery triage via wheelchair steered by ED staff for evaluation from the office for a NRNST. She denies signs and symptoms consistent with rupture of membranes or active vaginal bleeding. Pt reports having a HA. She denies contractions and states positive fetal movement. External FM and TOCO applied to non-tender abdomen. Initial FHR 135. Vital signs obtained, BP 149/101. Patient oriented to care environment including call bell and bed control use. Sebastian, CNM notified of patient's arrival. Plan to start IV, give fluid bolus, draw PIH labs.  Lori Mutz L. Arick Mareno, RN BSN 11/19/2023 5:45 PM

## 2023-11-19 NOTE — Progress Notes (Signed)
 I was contacted by patients OB provider Wilbur Hummer) regarding a non-reactive tracing. The provider notes that they are some spontaneous decelerations. But the variability is good. There are no accelerations currently. The fetus has been growth restricted in less than the 1st percentile. I discussed that the patient should be admitted,  Placed on continuous monitoring, given betamethasone, and an NICU consult should be done. Indications for delivery would be three or more clinically significant spontaneous or provoked decelerations, or a substantial, loss of fetal variability in the setting of a lack of acceleration, despite intrauterine resuscitation. Please do not hesitate to call [facility] with any further concerns. If the patient has a reactive or reassuring tracing, then the goal should be to continue the pregnancy until 34 weeks and then the course can be reassessed.

## 2023-11-19 NOTE — H&P (Signed)
 ANTEPARTUM ADMISSION HISTORY AND PHYSICAL NOTE   History of Present Illness: Lori Gross is a 28 y.o. G3P2002 at [redacted]w[redacted]d admitted for prolonged monitoring.   She was seen in the office today for NST and sent for prolonged monitoring due to non reactive strip. She has history of chronic hypertension on labetalol  and procardia  . She was recently diagnosed with severe intrauterine growth restriction in the 1st percentile.   Patient reports the fetal movement as active. Patient reports uterine contraction  activity as none. Patient reports  vaginal bleeding as none. Patient describes fluid per vagina as None. Fetal presentation is cephalic u/s on 11/16/23  Patient Active Problem List   Diagnosis Date Noted   Labor and delivery, indication for care 11/19/2023   Obesity affecting pregnancy, antepartum 11/15/2023   Fetal growth restriction antepartum 11/10/2023   Anemia affecting pregnancy in third trimester 10/14/2023   Abdominal pain affecting pregnancy 08/19/2023   Chronic hypertension affecting pregnancy 07/21/2023   Supervision of high-risk pregnancy 05/17/2023   Systolic murmur 07/03/2021   Hypertension, benign 11/20/2015   Rh negative state in antepartum period 08/27/2015    Past Medical History:  Diagnosis Date   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   B12 deficiency 11/14/2021   Benign headache    Dyslipidemia 07/15/2018   Dysmenorrhea    Grieving    Hyperlipidemia due to dietary fat intake    Hypertension    Marijuana use 04/20/2014   positive UDS   Microalbuminuria    Migraine without aura and responsive to treatment 10/08/2014   Migraine without aura and without status migrainosus, not intractable    Morbid obesity (HCC) 09/04/2021   Morbid obesity due to excess calories (HCC)    BMI 37-38 with HTN & HLD   Obesity    Obesity (BMI 30.0-34.9) 10/08/2014   Patient to limit weight gain in pregnancy to 15 pounds     Palpitations 11/14/2021   Plantar fasciitis, left  11/14/2021   Tachycardia 07/15/2018   Urticaria    Vitamin D  deficiency 11/14/2021   Wrist pain, left 05/20/2021    Past Surgical History:  Procedure Laterality Date   NO PAST SURGERIES     TRANSTHORACIC ECHOCARDIOGRAM  07/30/2021   HR 100 bpm. Normal LV size & fxn. EF 55-60%. No RWMA. Gr 1 DD? But normal LA size. Normal Valves. Normal RV, RVSP & RAP.    OB History  Gravida Para Term Preterm AB Living  3 2 2  0 0 2  SAB IAB Ectopic Multiple Live Births  0  0 0 2    # Outcome Date GA Lbr Len/2nd Weight Sex Type Anes PTL Lv  3 Current           2 Term 02/26/17 [redacted]w[redacted]d / 00:17 3720 g M Vag-Spont EPI  LIV  1 Term 11/08/15 [redacted]w[redacted]d / 01:44 3090 g F Vag-Spont EPI  LIV     Complications: Chronic hypertension affecting pregnancy    Obstetric Comments  Menstrual age: 76    Age 1st Pregnancy: 23    Social History   Socioeconomic History   Marital status: Single    Spouse name: Not on file   Number of children: 2   Years of education: Not on file   Highest education level: High school graduate  Occupational History   Occupation: Glass blower/designer: [employer]    Comment: [employer]  Tobacco Use   Smoking status: Never   Smokeless tobacco: Never  Vaping Use  Vaping status: Never Used  Substance and Sexual Activity   Alcohol use: No    Alcohol/week: 0.0 standard drinks of alcohol   Drug use: No   Sexual activity: Yes    Partners: Male    Birth control/protection: Pill  Other Topics Concern   Not on file  Social History Narrative   She was living with Missy they have  a baby girl - Deidre on July 21st, 2017 and one boy named Missy in 2018, they lived together however separated since Nov 2023    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   Overall Financial Resource Strain (CARDIA)    Difficulty of Paying Living Expenses: Not hard at all   Food Insecurity: No Food Insecurity (09/20/2023)   Received from [facility]   Hunger Vital Sign    Within the past 12 months, you worried that your food would run out before you got the money to buy more.: Never true    Within the past 12 months, the food you bought just didn't last and you didn't have money to get more.: Never true  Transportation Needs: No Transportation Needs (09/20/2023)   Received from [facility] - Transportation    In the past 12 months, has lack of transportation kept you from medical appointments or from getting medications?: No    Lack of Transportation (Non-Medical): No  Physical Activity: Insufficiently Active (05/17/2023)   Exercise Vital Sign    Days of Exercise per Week: 1 day    Minutes of Exercise per Session: 30 min  Stress: No Stress Concern Present (05/17/2023)   [employer] of Occupational Health - Occupational Stress Questionnaire    Feeling of Stress : Only a little  Social Connections: Socially Isolated (05/17/2023)   Social Connection and Isolation Panel    Frequency of Communication with Friends and Family: Once a week    Frequency of Social Gatherings with Friends and Family: Never    Attends Religious Services: Never    Database administrator or Organizations: No    Attends Engineer, structural: Never    Marital Status: Living with partner    Family History  Problem Relation Age of Onset   Hypertension Mother    Cancer Mother 43       Common Bile Duct   Hypertension Sister    Hypertension Brother    Diabetes Paternal Grandmother    Cancer Paternal Grandmother 65       brain cancer   Diabetes Paternal Grandfather    Hypertension Father    Cancer Father 44       kidney cancer    No Known Allergies  Medications Prior to Admission  Medication Sig Dispense Refill Last Dose/Taking   amoxicillin  (AMOXIL ) 500 MG capsule Take 1 capsule (500 mg total) by mouth 3 (three) times  daily. 21 capsule 2 11/19/2023   Ferric Maltol  (ACCRUFER ) 30 MG CAPS Take 1 capsule (30 mg total) by mouth daily. 30 capsule 6 11/19/2023   labetalol  (NORMODYNE ) 100 MG tablet Take 3 tablets (300 mg total) by mouth 2 (two) times daily. 180 tablet 6 11/19/2023   NIFEdipine  (PROCARDIA  XL) 60 MG 24 hr tablet Take 1 tablet (60 mg total) by mouth daily. 30 tablet 6 11/19/2023   phenazopyridine  (PYRIDIUM ) 100 MG tablet Take 1 tablet (100 mg total)  by mouth 3 (three) times daily with meals. 10 tablet 0 11/19/2023   Prenatal MV & Min w/FA-DHA (PRENATAL GUMMIES PO) Take by mouth.   11/19/2023    Review of Systems - Respiratory ROS: no cough, shortness of breath, or wheezing Cardiovascular ROS: no chest pain or dyspnea on exertion Gastrointestinal ROS: no abdominal pain, change in bowel habits, or black or bloody stools Genito-Urinary ROS: no dysuria, trouble voiding, or hematuria Musculoskeletal ROS: negative Neurological ROS: negative  Vitals:  BP 127/74   Pulse 92   Temp 97.9 F (36.6 C) (Oral)   Resp 17   Ht 5' 4 (1.626 m)   Wt 98.4 kg   LMP 03/30/2023   BMI 37.25 kg/m  Physical Examination: CONSTITUTIONAL: Well-developed, well-nourished, over weight female in no acute distress.  HENT:  Normocephalic, atraumatic, External right and left ear normal. Oropharynx is clear and moist EYES: Conjunctivae and EOM are normal. Pupils are equal, round, and reactive to light. No scleral icterus.  NECK: Normal range of motion, supple, no masses SKIN: Skin is warm and dry. No rash noted. Not diaphoretic. No erythema. No pallor. NEUROLOGIC: Alert and oriented to person, place, and time. Normal reflexes, muscle tone coordination. No cranial nerve deficit noted. PSYCHIATRIC: Normal mood and affect. Normal behavior. Normal judgment and thought content. CARDIOVASCULAR: Normal heart rate noted, regular rhythm RESPIRATORY: Effort and breath sounds normal, no problems with respiration noted ABDOMEN: Soft, nontender,  nondistended, gravid. MUSCULOSKELETAL: Normal range of motion. No edema and no tenderness. 2+ distal pulses.  Cervix: Not evaluated. . Membranes:intact Fetal Monitoring:Baseline: 135 bpm, Variability: Good {> 6 bpm), Accelerations: absent, and Decelerations: Absent Tocometer: Flat  Labs:  Results for orders placed or performed during the hospital encounter of 11/19/23 (from the past 24 hours)  Protein / creatinine ratio, urine   Collection Time: 11/19/23  5:46 PM  Result Value Ref Range   Creatinine, Urine 190 mg/dL   Total Protein, Urine 42 mg/dL   Protein Creatinine Ratio 0.22 (H) 0.00 - 0.15 mg/mg[Cre]  CBC   Collection Time: 11/19/23  5:52 PM  Result Value Ref Range   WBC 9.3 4.0 - 10.5 K/uL   RBC 2.95 (L) 3.87 - 5.11 MIL/uL   Hemoglobin 10.7 (L) 12.0 - 15.0 g/dL   HCT 69.5 (L) 63.9 - 53.9 %   MCV 103.1 (H) 80.0 - 100.0 fL   MCH 36.3 (H) 26.0 - 34.0 pg   MCHC 35.2 30.0 - 36.0 g/dL   RDW 86.7 88.4 - 84.4 %   Platelets 215 150 - 400 K/uL   nRBC 0.0 0.0 - 0.2 %  Comprehensive metabolic panel   Collection Time: 11/19/23  5:52 PM  Result Value Ref Range   Sodium 138 135 - 145 mmol/L   Potassium 4.0 3.5 - 5.1 mmol/L   Chloride 106 98 - 111 mmol/L   CO2 22 22 - 32 mmol/L   Glucose, Bld 63 (L) 70 - 99 mg/dL   BUN 9 6 - 20 mg/dL   Creatinine, Ser 9.28 0.44 - 1.00 mg/dL   Calcium 9.1 8.9 - 89.6 mg/dL   Total Protein 6.6 6.5 - 8.1 g/dL   Albumin 3.0 (L) 3.5 - 5.0 g/dL   AST 16 15 - 41 U/L   ALT 10 0 - 44 U/L   Alkaline Phosphatase 116 38 - 126 U/L   Total Bilirubin 0.4 0.0 - 1.2 mg/dL   GFR, Estimated >39 >39 mL/min   Anion gap 10 5 - 15  Imaging Studies: US  MFM OB DETAIL +14 WK Result Date: 11/16/2023 ----------------------------------------------------------------------  OBSTETRICS REPORT                    (Corrected Final 11/16/2023 10:10 am) ---------------------------------------------------------------------- Patient Info  ID #:       [id number]                           D.O.B.:  07/03/1995 (27 yrs)(F)  Name:       Lori Gross              Visit Date: 11/16/2023 07:51 am ---------------------------------------------------------------------- Performed By  Attending:        Steffan Keys MD         Ref. Address:     [facility]                                                             [address]                                                             [place]                                                             [address]  Performed By:     Cosette Mor         Location[facility]                                  [facility]  Referred  By:      LYDIA MARIE                    DOMINIC CNM ---------------------------------------------------------------------- Orders  #  Description                           Code        Ordered By  1  US  MFM OB DETAIL +14 WK               76811.01    LYDIA DOMINIC  2  US  MFM UA CORD DOPPLER                76820.02    LYDIA DOMINIC  3  US  MFM FETAL BPP WO NON               76819.01    LYDIA DOMINIC     STRESS ----------------------------------------------------------------------  #  Order #                     Accession #                Episode #  1  [id number]                   [id number]                 [id number]  2  [id number]                   [id number]                 [id number]  3  [id number]                   [id number]                 [id number] ---------------------------------------------------------------------- Indications  Maternal care for known or suspected poor      O36.5931  fetal growth, third trimester, fetus 1 IUGR  Hypertension - Chronic/Pre-existing            O10.019  [redacted] weeks gestation of pregnancy                Z3A.33  Obesity complicating pregnancy, third           O99.213  trimester (BMI 36)  Low risk female ---------------------------------------------------------------------- Vital Signs  BP:          151/97 ---------------------------------------------------------------------- Fetal Evaluation  Num Of Fetuses:         1  Fetal Heart Rate(bpm):  128  Cardiac Activity:       Observed  Presentation:           Cephalic  Placenta:               Posterior  P. Cord Insertion:      Not well visualized  Amniotic Fluid  AFI FV:      Within normal limits  AFI Sum(cm)     %Tile       Largest Pocket(cm)  12.32           35          4.06  RUQ(cm)       RLQ(cm)       LUQ(cm)        LLQ(cm)  4.06          3.2  2.1            2.96 ---------------------------------------------------------------------- Biophysical Evaluation  Amniotic F.V:   Within normal limits       F. Tone:        Observed  F. Movement:    Observed                   Score:          8/8  F. Breathing:   Observed ---------------------------------------------------------------------- Biometry  BPD:      77.2  mm     G. Age:  31w 0d        3.6  %    CI:        70.84   %    70 - 86                                                          FL/HC:      20.3   %    19.9 - 21.5  HC:      292.3  mm     G. Age:  32w 2d          5  %    HC/AC:      1.17        0.96 - 1.11  AC:      250.4  mm     G. Age:  29w 2d        < 1  %    FL/BPD:     76.9   %    71 - 87  FL:       59.4  mm     G. Age:  31w 0d        3.6  %    FL/AC:      23.7   %    20 - 24  HUM:      54.8  mm     G. Age:  31w 6d         37  %  CER:      38.8  mm     G. Age:  31w 5d          7  %  LV:        4.2  mm  CM:        7.3  mm  Est. FW:    1534  gm      3 lb 6 oz    1.1  % ---------------------------------------------------------------------- OB History  Gravidity:    3         Term:   2 ---------------------------------------------------------------------- Gestational Age  LMP:           33w 0d        Date:  03/30/23                 EDD:   01/04/24  U/S  Today:     30w 6d                                        EDD:   01/19/24  Best:          33w 0d  Det. By:  LMP  (03/30/23)          EDD:   01/04/24 ---------------------------------------------------------------------- Targeted Anatomy  Central Nervous System  Calvarium/Cranial V.:  Appears normal         Cereb./Vermis:          visualized  Cavum:                 Appears normal         Cisterna Magna:         Appears normal  Lateral Ventricles:    right visualized       Midline Falx:           Appears normal  Choroid Plexus:        Not well visualized  Spine  Cervical:              Not well visualized    Sacral:                 Not well visualized  Thoracic:              Not well visualized    Shape/Curvature:        Not well visualized  Lumbar:                Not well visualized  Head/Neck  Lips:                  Appears normal         Profile:                Appears normal  Neck:                  Not well visualized    Orbits/Eyes:            visualized  Nuchal Fold:           Not applicable         Mandible:               Not well visualized  Nasal Bone:            Present                Maxilla:                Not well visualized  Thorax  4 Chamber View:        Appears normal         Interventr. Septum:     Appears normal  Cardiac Rhythm:        Normal                 Cardiac Axis:           Normal  Cardiac Situs:         Appears normal         Diaphragm:              Appears normal  Rt Outflow Tract:      Not well visualized    3 Vessel View:          Appears normal  Lt Outflow Tract:      visualized             3 V Trachea View:       Appears normal  Aortic Arch:           Not well visualized    IVC:  Not well visualized  Ductal Arch:           Not well visualized    Crossing:               Not well visualized  SVC:                   Not well visualized  Abdomen  Ventral Wall:          Not well visualized    Lt Kidney:              Appears normal  Cord Insertion:        Not well visualized     Rt Kidney:              Appears normal  Situs:                 Appears normal         Bladder:                Appears normal  Stomach:               Appears normal  Extremities  Lt Humerus:            Appears normal         Lt Femur:               Appears normal  Rt Humerus:            Not well visualized    Rt Femur:               Appears normal  Lt Forearm:            Appears normal         Lt Lower Leg:           visualized  Rt Forearm:            Not well visualized    Rt Lower Leg:           visualized  Lt Hand:               Not well visualized    Lt Foot:                Not well visualized  Rt Hand:               Not well visualized    Rt Foot:                Not well visualized  Other  Umbilical Cord:        Normal 3-vessel        Genitalia:              Not well visualized  Comment:     Technically difficult due to gestational age. ---------------------------------------------------------------------- Doppler - Fetal Vessels  Umbilical Artery   S/D     %tile      RI    %tile      PI    %tile     PSV    ADFV    RDFV                                                     (cm/s)   5.14   > 97.5  0.81   > 97.5    1.44   > 97.5    33.05      No      No ---------------------------------------------------------------------- Cervix Uterus Adnexa  Cervix  Length:            4.4  cm.  Normal appearance by transabdominal scan  Uterus  No abnormality visualized.  Right Ovary  Not visualized.  Left Ovary  Not visualized.  Cul De Sac  No free fluid seen.  Adnexa  No abnormality visualized ---------------------------------------------------------------------- Comments  Lori Gross is currently at 33 weeks and 0 days.  She  was seen due to chronic hypertension treated with both  labetalol  300 mg twice a day and nifedipine  XL 60 mg daily.  IUGR was noted on a recent ultrasound performed in your  office.  She had PIH labs drawn 3 weeks ago which were all within  normal limits.  Her P/C ratio performed at that time did  not  indicate significant proteinuria.  The patient reports that she has had hypertension since age  32.  Her blood pressure today was 151/97.  She denies any signs or symptoms of preeclampsia and  reports feeling fetal movements throughout the day.  She had a cell free DNA test drawn earlier in her pregnancy  which indicated a low risk for trisomy 13, 71, and 13.  A female  fetus is predicted.  On today's exam, the EFW of 3 pounds 6 ounces measures  at the 1st percentile for her gestational age indicating severe  IUGR.  The total AFI was 12.32 cm (within normal limits).  A BPP performed today was 8 out of 8.  Doppler studies of the umbilical arteries showed an elevated  S/D ratio of 5.14 .  There were no signs of absent or reversed  end-diastolic flow.  The views of the fetal anatomy were limited today due to her  advanced gestational age.  There were no obvious fetal  anomalies noted.  The following were discussed during today's consultation:  Chronic hypertension/severe IUGR  The patient was advised that her elevated blood pressures  may have contributed to the fetal growth restriction noted  today.  The increased risk of superimposed preeclampsia due to  IUGR and chronic hypertension was discussed.  Preeclampsia precautions were reviewed.  Fetal kick count instructions were reviewed today.  Due to severe IUGR and chronic hypertension, she should be  seen for twice-weekly fetal testing and blood pressure  checks.  We will see her once in our office each week for a BPP and  umbilical artery Doppler study.  She should also have an NST  and blood pressure check performed in your office each week.  The patient understands that the goal for her delivery will be  at around 37 weeks.  However, delivery prior to 37 weeks may be indicated should  she develop preeclampsia, should her blood pressures be  difficult to control, should her umbilical artery Doppler studies  show absent or reversed end-diastolic flow, or at any  time for  nonreassuring fetal status.  She will return to our office in 1 week for another BPP and  umbilical artery Doppler study.  Please schedule the patient for an NST and blood pressure  check in your office either Thursday or Friday of this week.  The patient stated that she understood everything that was  discussed with her and all of her questions were answered  today.  A total of 45 minutes was  spent counseling and coordinating  the care for this patient.  Greater than 50% of the time was  spent in direct face-to-face contact. ----------------------------------------------------------------------                       Steffan Keys, MD Electronically Signed Corrected Final Report  11/16/2023 10:10 am ----------------------------------------------------------------------   US  MFM UA CORD DOPPLER Result Date: 11/16/2023 ----------------------------------------------------------------------  OBSTETRICS REPORT                    (Corrected Final 11/16/2023 10:10 am) ---------------------------------------------------------------------- Patient Info  ID #:       [id number]                          D.O.B.:  13-Jul-1995 (27 yrs)(F)  Name:       Lori Gross              Visit Date: 11/16/2023 07:51 am ---------------------------------------------------------------------- Performed By  Attending:        Steffan Keys MD         Ref. Address:     [facility]                                                             [address]                                                             [place]                                                             [address]  Performed By:     Cosette Mor         Location:         [facility]                                  [facility]                                                             [facility]  Referred By:      JINNIE JANSKY                    DOMINIC CNM ---------------------------------------------------------------------- Orders  #  Description                           Code        Ordered By  1  US  MFM OB DETAIL +14 WK               76811.01    LYDIA DOMINIC  2  US  MFM UA CORD DOPPLER                76820.02    LYDIA DOMINIC  3  US  MFM FETAL BPP WO NON               76819.01    LYDIA DOMINIC     STRESS ----------------------------------------------------------------------  #  Order #                     Accession #                Episode #  1  [id number]                   [id number]                 [id number]  2  [id number]                   [id number]                 [id number]  3  [id number]                   [id number]                 [id number] ---------------------------------------------------------------------- Indications  Maternal care for known or suspected poor      O36.5931  fetal growth, third trimester, fetus 1 IUGR  Hypertension - Chronic/Pre-existing            O10.019  [redacted] weeks gestation of pregnancy                Z3A.33  Obesity complicating pregnancy, third          O99.213  trimester (BMI 36)  Low risk female ---------------------------------------------------------------------- Vital Signs  BP:          151/97 ---------------------------------------------------------------------- Fetal Evaluation  Num Of Fetuses:         1  Fetal Heart Rate(bpm):  128  Cardiac Activity:       Observed  Presentation:           Cephalic  Placenta:               Posterior  P. Cord Insertion:      Not well visualized  Amniotic Fluid  AFI FV:      Within normal limits  AFI Sum(cm)     %Tile       Largest Pocket(cm)  12.32           35          4.06  RUQ(cm)       RLQ(cm)       LUQ(cm)        LLQ(cm)  4.06          3.2  2.1            2.96 ---------------------------------------------------------------------- Biophysical Evaluation  Amniotic F.V:   Within normal limits       F. Tone:         Observed  F. Movement:    Observed                   Score:          8/8  F. Breathing:   Observed ---------------------------------------------------------------------- Biometry  BPD:      77.2  mm     G. Age:  31w 0d        3.6  %    CI:        70.84   %    70 - 86                                                          FL/HC:      20.3   %    19.9 - 21.5  HC:      292.3  mm     G. Age:  32w 2d          5  %    HC/AC:      1.17        0.96 - 1.11  AC:      250.4  mm     G. Age:  29w 2d        < 1  %    FL/BPD:     76.9   %    71 - 87  FL:       59.4  mm     G. Age:  31w 0d        3.6  %    FL/AC:      23.7   %    20 - 24  HUM:      54.8  mm     G. Age:  31w 6d         37  %  CER:      38.8  mm     G. Age:  31w 5d          7  %  LV:        4.2  mm  CM:        7.3  mm  Est. FW:    1534  gm      3 lb 6 oz    1.1  % ---------------------------------------------------------------------- OB History  Gravidity:    3         Term:   2 ---------------------------------------------------------------------- Gestational Age  LMP:           33w 0d        Date:  03/30/23                 EDD:   01/04/24  U/S Today:     30w 6d                                        EDD:   01/19/24  Best:          33w 0d  Det. By:  LMP  (03/30/23)          EDD:   01/04/24 ---------------------------------------------------------------------- Targeted Anatomy  Central Nervous System  Calvarium/Cranial V.:  Appears normal         Cereb./Vermis:          visualized  Cavum:                 Appears normal         Cisterna Magna:         Appears normal  Lateral Ventricles:    right visualized       Midline Falx:           Appears normal  Choroid Plexus:        Not well visualized  Spine  Cervical:              Not well visualized    Sacral:                 Not well visualized  Thoracic:              Not well visualized    Shape/Curvature:        Not well visualized  Lumbar:                Not well visualized  Head/Neck  Lips:                  Appears  normal         Profile:                Appears normal  Neck:                  Not well visualized    Orbits/Eyes:            visualized  Nuchal Fold:           Not applicable         Mandible:               Not well visualized  Nasal Bone:            Present                Maxilla:                Not well visualized  Thorax  4 Chamber View:        Appears normal         Interventr. Septum:     Appears normal  Cardiac Rhythm:        Normal                 Cardiac Axis:           Normal  Cardiac Situs:         Appears normal         Diaphragm:              Appears normal  Rt Outflow Tract:      Not well visualized    3 Vessel View:          Appears normal  Lt Outflow Tract:      visualized             3 V Trachea View:       Appears normal  Aortic Arch:           Not well visualized    IVC:  Not well visualized  Ductal Arch:           Not well visualized    Crossing:               Not well visualized  SVC:                   Not well visualized  Abdomen  Ventral Wall:          Not well visualized    Lt Kidney:              Appears normal  Cord Insertion:        Not well visualized    Rt Kidney:              Appears normal  Situs:                 Appears normal         Bladder:                Appears normal  Stomach:               Appears normal  Extremities  Lt Humerus:            Appears normal         Lt Femur:               Appears normal  Rt Humerus:            Not well visualized    Rt Femur:               Appears normal  Lt Forearm:            Appears normal         Lt Lower Leg:           visualized  Rt Forearm:            Not well visualized    Rt Lower Leg:           visualized  Lt Hand:               Not well visualized    Lt Foot:                Not well visualized  Rt Hand:               Not well visualized    Rt Foot:                Not well visualized  Other  Umbilical Cord:        Normal 3-vessel        Genitalia:              Not well visualized  Comment:     Technically difficult due  to gestational age. ---------------------------------------------------------------------- Doppler - Fetal Vessels  Umbilical Artery   S/D     %tile      RI    %tile      PI    %tile     PSV    ADFV    RDFV                                                     (cm/s)   5.14   > 97.5  0.81   > 97.5    1.44   > 97.5    33.05      No      No ---------------------------------------------------------------------- Cervix Uterus Adnexa  Cervix  Length:            4.4  cm.  Normal appearance by transabdominal scan  Uterus  No abnormality visualized.  Right Ovary  Not visualized.  Left Ovary  Not visualized.  Cul De Sac  No free fluid seen.  Adnexa  No abnormality visualized ---------------------------------------------------------------------- Comments  Lori Gross is currently at 33 weeks and 0 days.  She  was seen due to chronic hypertension treated with both  labetalol  300 mg twice a day and nifedipine  XL 60 mg daily.  IUGR was noted on a recent ultrasound performed in your  office.  She had PIH labs drawn 3 weeks ago which were all within  normal limits.  Her P/C ratio performed at that time did not  indicate significant proteinuria.  The patient reports that she has had hypertension since age  38.  Her blood pressure today was 151/97.  She denies any signs or symptoms of preeclampsia and  reports feeling fetal movements throughout the day.  She had a cell free DNA test drawn earlier in her pregnancy  which indicated a low risk for trisomy 14, 51, and 13.  A female  fetus is predicted.  On today's exam, the EFW of 3 pounds 6 ounces measures  at the 1st percentile for her gestational age indicating severe  IUGR.  The total AFI was 12.32 cm (within normal limits).  A BPP performed today was 8 out of 8.  Doppler studies of the umbilical arteries showed an elevated  S/D ratio of 5.14 .  There were no signs of absent or reversed  end-diastolic flow.  The views of the fetal anatomy were limited today due to her  advanced  gestational age.  There were no obvious fetal  anomalies noted.  The following were discussed during today's consultation:  Chronic hypertension/severe IUGR  The patient was advised that her elevated blood pressures  may have contributed to the fetal growth restriction noted  today.  The increased risk of superimposed preeclampsia due to  IUGR and chronic hypertension was discussed.  Preeclampsia precautions were reviewed.  Fetal kick count instructions were reviewed today.  Due to severe IUGR and chronic hypertension, she should be  seen for twice-weekly fetal testing and blood pressure  checks.  We will see her once in our office each week for a BPP and  umbilical artery Doppler study.  She should also have an NST  and blood pressure check performed in your office each week.  The patient understands that the goal for her delivery will be  at around 37 weeks.  However, delivery prior to 37 weeks may be indicated should  she develop preeclampsia, should her blood pressures be  difficult to control, should her umbilical artery Doppler studies  show absent or reversed end-diastolic flow, or at any time for  nonreassuring fetal status.  She will return to our office in 1 week for another BPP and  umbilical artery Doppler study.  Please schedule the patient for an NST and blood pressure  check in your office either Thursday or Friday of this week.  The patient stated that she understood everything that was  discussed with her and all of her questions were answered  today.  A total of 45 minutes was spent  counseling and coordinating  the care for this patient.  Greater than 50% of the time was  spent in direct face-to-face contact. ----------------------------------------------------------------------                       Steffan Keys, MD Electronically Signed Corrected Final Report  11/16/2023 10:10 am ----------------------------------------------------------------------   US  MFM FETAL BPP WO NON STRESS Result Date:  11/16/2023 ----------------------------------------------------------------------  OBSTETRICS REPORT                    (Corrected Final 11/16/2023 10:10 am) ---------------------------------------------------------------------- Patient Info  ID #:       [id number]                          D.O.B.:  03/03/1996 (27 yrs)(F)  Name:       Lori Gross              Visit Date: 11/16/2023 07:51 am ---------------------------------------------------------------------- Performed By  Attending:        Steffan Keys MD         Ref. Address:     [facility]                                                             [address]                                                             [place]                                                             [address]  Performed By:     Cosette Mor         [facility]                                                             [facility]  Referred By:      JINNIE JANSKY                    DOMINIC CNM ---------------------------------------------------------------------- Orders  #  Description                           Code        Ordered By  1  US  MFM OB DETAIL +14 WK               76811.01    LYDIA DOMINIC  2  US  MFM UA CORD DOPPLER                76820.02    LYDIA DOMINIC  3  US  MFM FETAL BPP WO NON               76819.01    LYDIA DOMINIC     STRESS ----------------------------------------------------------------------  #  Order #                     Accession #                Episode #  1  [id number]                   [id number]                 [id number]  2  [id number]                   [id number]                 [id number]  3  [id number]                   [id number]                 [id number]  ---------------------------------------------------------------------- Indications  Maternal care for known or suspected poor      O36.5931  fetal growth, third trimester, fetus 1 IUGR  Hypertension - Chronic/Pre-existing            O10.019  [redacted] weeks gestation of pregnancy                Z3A.33  Obesity complicating pregnancy, third          O99.213  trimester (BMI 36)  Low risk female ---------------------------------------------------------------------- Vital Signs  BP:          151/97 ---------------------------------------------------------------------- Fetal Evaluation  Num Of Fetuses:         1  Fetal Heart Rate(bpm):  128  Cardiac Activity:       Observed  Presentation:           Cephalic  Placenta:               Posterior  P. Cord Insertion:      Not well visualized  Amniotic Fluid  AFI FV:      Within normal limits  AFI Sum(cm)     %Tile       Largest Pocket(cm)  12.32           35          4.06  RUQ(cm)       RLQ(cm)       LUQ(cm)        LLQ(cm)  4.06          3.2  2.1            2.96 ---------------------------------------------------------------------- Biophysical Evaluation  Amniotic F.V:   Within normal limits       F. Tone:        Observed  F. Movement:    Observed                   Score:          8/8  F. Breathing:   Observed ---------------------------------------------------------------------- Biometry  BPD:      77.2  mm     G. Age:  31w 0d        3.6  %    CI:        70.84   %    70 - 86                                                          FL/HC:      20.3   %    19.9 - 21.5  HC:      292.3  mm     G. Age:  32w 2d          5  %    HC/AC:      1.17        0.96 - 1.11  AC:      250.4  mm     G. Age:  29w 2d        < 1  %    FL/BPD:     76.9   %    71 - 87  FL:       59.4  mm     G. Age:  31w 0d        3.6  %    FL/AC:      23.7   %    20 - 24  HUM:      54.8  mm     G. Age:  31w 6d         37  %  CER:      38.8  mm     G. Age:  31w 5d          7  %  LV:        4.2  mm  CM:        7.3   mm  Est. FW:    1534  gm      3 lb 6 oz    1.1  % ---------------------------------------------------------------------- OB History  Gravidity:    3         Term:   2 ---------------------------------------------------------------------- Gestational Age  LMP:           33w 0d        Date:  03/30/23                 EDD:   01/04/24  U/S Today:     30w 6d                                        EDD:   01/19/24  Best:          33w 0d  Det. By:  LMP  (03/30/23)          EDD:   01/04/24 ---------------------------------------------------------------------- Targeted Anatomy  Central Nervous System  Calvarium/Cranial V.:  Appears normal         Cereb./Vermis:          visualized  Cavum:                 Appears normal         Cisterna Magna:         Appears normal  Lateral Ventricles:    right visualized       Midline Falx:           Appears normal  Choroid Plexus:        Not well visualized  Spine  Cervical:              Not well visualized    Sacral:                 Not well visualized  Thoracic:              Not well visualized    Shape/Curvature:        Not well visualized  Lumbar:                Not well visualized  Head/Neck  Lips:                  Appears normal         Profile:                Appears normal  Neck:                  Not well visualized    Orbits/Eyes:            visualized  Nuchal Fold:           Not applicable         Mandible:               Not well visualized  Nasal Bone:            Present                Maxilla:                Not well visualized  Thorax  4 Chamber View:        Appears normal         Interventr. Septum:     Appears normal  Cardiac Rhythm:        Normal                 Cardiac Axis:           Normal  Cardiac Situs:         Appears normal         Diaphragm:              Appears normal  Rt Outflow Tract:      Not well visualized    3 Vessel View:          Appears normal  Lt Outflow Tract:      visualized             3 V Trachea View:       Appears normal  Aortic Arch:           Not  well visualized    IVC:  Not well visualized  Ductal Arch:           Not well visualized    Crossing:               Not well visualized  SVC:                   Not well visualized  Abdomen  Ventral Wall:          Not well visualized    Lt Kidney:              Appears normal  Cord Insertion:        Not well visualized    Rt Kidney:              Appears normal  Situs:                 Appears normal         Bladder:                Appears normal  Stomach:               Appears normal  Extremities  Lt Humerus:            Appears normal         Lt Femur:               Appears normal  Rt Humerus:            Not well visualized    Rt Femur:               Appears normal  Lt Forearm:            Appears normal         Lt Lower Leg:           visualized  Rt Forearm:            Not well visualized    Rt Lower Leg:           visualized  Lt Hand:               Not well visualized    Lt Foot:                Not well visualized  Rt Hand:               Not well visualized    Rt Foot:                Not well visualized  Other  Umbilical Cord:        Normal 3-vessel        Genitalia:              Not well visualized  Comment:     Technically difficult due to gestational age. ---------------------------------------------------------------------- Doppler - Fetal Vessels  Umbilical Artery   S/D     %tile      RI    %tile      PI    %tile     PSV    ADFV    RDFV                                                     (cm/s)   5.14   > 97.5    0.81   >  97.5    1.44   > 97.5    33.05      No      No ---------------------------------------------------------------------- Cervix Uterus Adnexa  Cervix  Length:            4.4  cm.  Normal appearance by transabdominal scan  Uterus  No abnormality visualized.  Right Ovary  Not visualized.  Left Ovary  Not visualized.  Cul De Sac  No free fluid seen.  Adnexa  No abnormality visualized ---------------------------------------------------------------------- Comments  Lori Gross is  currently at 33 weeks and 0 days.  She  was seen due to chronic hypertension treated with both  labetalol  300 mg twice a day and nifedipine  XL 60 mg daily.  IUGR was noted on a recent ultrasound performed in your  office.  She had PIH labs drawn 3 weeks ago which were all within  normal limits.  Her P/C ratio performed at that time did not  indicate significant proteinuria.  The patient reports that she has had hypertension since age  65.  Her blood pressure today was 151/97.  She denies any signs or symptoms of preeclampsia and  reports feeling fetal movements throughout the day.  She had a cell free DNA test drawn earlier in her pregnancy  which indicated a low risk for trisomy 52, 21, and 13.  A female  fetus is predicted.  On today's exam, the EFW of 3 pounds 6 ounces measures  at the 1st percentile for her gestational age indicating severe  IUGR.  The total AFI was 12.32 cm (within normal limits).  A BPP performed today was 8 out of 8.  Doppler studies of the umbilical arteries showed an elevated  S/D ratio of 5.14 .  There were no signs of absent or reversed  end-diastolic flow.  The views of the fetal anatomy were limited today due to her  advanced gestational age.  There were no obvious fetal  anomalies noted.  The following were discussed during today's consultation:  Chronic hypertension/severe IUGR  The patient was advised that her elevated blood pressures  may have contributed to the fetal growth restriction noted  today.  The increased risk of superimposed preeclampsia due to  IUGR and chronic hypertension was discussed.  Preeclampsia precautions were reviewed.  Fetal kick count instructions were reviewed today.  Due to severe IUGR and chronic hypertension, she should be  seen for twice-weekly fetal testing and blood pressure  checks.  We will see her once in our office each week for a BPP and  umbilical artery Doppler study.  She should also have an NST  and blood pressure check performed in your office  each week.  The patient understands that the goal for her delivery will be  at around 37 weeks.  However, delivery prior to 37 weeks may be indicated should  she develop preeclampsia, should her blood pressures be  difficult to control, should her umbilical artery Doppler studies  show absent or reversed end-diastolic flow, or at any time for  nonreassuring fetal status.  She will return to our office in 1 week for another BPP and  umbilical artery Doppler study.  Please schedule the patient for an NST and blood pressure  check in your office either Thursday or Friday of this week.  The patient stated that she understood everything that was  discussed with her and all of her questions were answered  today.  A total of 45 minutes was spent counseling and coordinating  the care for this patient.  Greater than 50% of the time was  spent in direct face-to-face contact. ----------------------------------------------------------------------                       Steffan Keys, MD Electronically Signed Corrected Final Report  11/16/2023 10:10 am ----------------------------------------------------------------------   US  UA Cord Doppler Result Date: 11/14/2023 Images from the original result were not included. UMIBILICAL CORD DOPPLER ULTRASOUND REPORT Patient Name: Lori Gross DOB: 1995-08-28 MRN: [id number] LMP: Patient's last menstrual period was 03/30/2023. Location: [facility] Date of Service: 11/10/2023 Ordering Provider: Jinnie Cookey, CNM Indications: Chronic Hypertenstion Findings: UA Dopplers appear normal. There is no absence or reversal of diastolic blood flow. 95th percentile is 4.26. S/D ratio is 2.67-3.79 Impression: Pt. To be seen at MFM for IUGR and low fluid. Lori Gross, RDMS (AB,OB,BR),RVT The ultrasound images and findings were reviewed by me and I agree with the above report. Alm DOROTHA Sar, M.D. 11/14/2023 9:16 AM  US  Fetal BPP W/O Non Stress Result Date:  11/14/2023 Images from the original result were not included. BIOPHYSICAL PROFILE ULTRASOUND REPORT Patient Name: Lori Gross DOB: 1996-03-21 MRN: [id number] Location: [facility] Date of Service: 11/10/2023 Ordering Provider: Jinnie Cookey, CNM Indications: Biophysical Profile for Chronic Hypertenstion Findings: Gerri intrauterine pregnancy is visualized with FHR at 127 bpm.   Fetal presentation is Cephalic. Placenta: posterior AFI: 12.04 cm BPP Scoring: Movement: 2/2 Tone: 2/2 Breathing: 2/2 AFI: 0/2 Impression: 1. [redacted]w[redacted]d Viable Singleton Intrauterine pregnancy dated by previously established criteria. 2.  AFI is 12.04 cm.  - Decreased. 3. BPP is 6/8 (fluid) Recommendations: 1.Clinical correlation with the patient's History and Physical Exam. 2. Direct communication w/ Jinnie about this patient. Pt. Should be seen at MFM for IUGR and low fluid. Lori Gross, RDMS (AB,OB,BR),RVT The ultrasound images and findings were reviewed by me and I agree with the above report. Alm DOROTHA Sar, M.D. 11/14/2023 9:16 AM  US  OB Follow Up Result Date: 11/14/2023 Images from the original result were not included. GROWTH ULTRASOUND REPORT Patient Name: Lori Gross DOB: 02-02-1996 MRN: [id number] Location: [facility] Date of Service: 11/10/2023 Ordering Provider: Harlene Cisco, CNM Indications:Growth/Chronic Hypertension Findings: Gerri intrauterine pregnancy is visualized with FHR at 131 bpm. Robley gives an (U/S) Gestational age of [redacted]w[redacted]d and an (U/S) EDD of 01/09/24; this correlates with the clinically established Estimated Date of Delivery: 01/04/24. Fetal presentation is Cephalic. Placenta: posterior.  AFI: 11.18 cm Growth is in the  8th percentile  AC is in the 11th percentile. EFW: 1616g, 3lb 9oz Impression: 1. [redacted]w[redacted]d Viable Single Intrauterine pregnancy dated by previously established criteria. 2. Growth is 8th percentile.  AFI is 11.18 cm. Recommendations:  1.Clinical correlation with the patient's History and Physical Exam. 2. Refer to MFM for IUGR and low fluid. Lori Gross, RDMS (AB,OB,BR),RVT The ultrasound images and findings were reviewed by me and I agree with the above report. Alm DOROTHA Sar, M.D. 11/14/2023 9:12 AM    Assessment and Plan: Patient Active Problem List   Diagnosis Date Noted   Labor and delivery, indication for care 11/19/2023   Obesity affecting pregnancy, antepartum 11/15/2023   Fetal growth restriction antepartum 11/10/2023   Anemia affecting pregnancy in third trimester 10/14/2023   Abdominal pain affecting pregnancy 08/19/2023   Chronic hypertension affecting pregnancy 07/21/2023   Supervision of high-risk pregnancy 05/17/2023   Systolic murmur 07/03/2021   Hypertension, benign 11/20/2015   Rh negative  state in antepartum period 08/27/2015   Admit to Antenatal Betamethasone x 2 doses Continuous monitoring  Dr. Janit notified of arrival , consulted on plan of care.  Dr. William, Maternal Fetal Medicine consulted   Lori Gross, CNM

## 2023-11-19 NOTE — Assessment & Plan Note (Signed)
 Reviewed appointment schedule that was recommended by MFM. Non-reactive NST in office today. Advised to go to triage for extending monitoring and further workup if indicated. Patient in agreement with plan.  Reviewed red flag warning signs anticipatory guidance for upcoming prenatal care.

## 2023-11-20 DIAGNOSIS — O365930 Maternal care for other known or suspected poor fetal growth, third trimester, not applicable or unspecified: Secondary | ICD-10-CM | POA: Diagnosis not present

## 2023-11-20 DIAGNOSIS — Z3A33 33 weeks gestation of pregnancy: Secondary | ICD-10-CM | POA: Diagnosis not present

## 2023-11-20 DIAGNOSIS — O368330 Maternal care for abnormalities of the fetal heart rate or rhythm, third trimester, not applicable or unspecified: Secondary | ICD-10-CM | POA: Diagnosis not present

## 2023-11-20 DIAGNOSIS — O365931 Maternal care for other known or suspected poor fetal growth, third trimester, fetus 1: Secondary | ICD-10-CM | POA: Diagnosis not present

## 2023-11-20 DIAGNOSIS — O113 Pre-existing hypertension with pre-eclampsia, third trimester: Secondary | ICD-10-CM

## 2023-11-20 LAB — RPR: RPR Ser Ql: NONREACTIVE

## 2023-11-20 LAB — PREPARE RBC (CROSSMATCH)

## 2023-11-20 NOTE — Progress Notes (Signed)
 Patient ID: Lori Gross, female   DOB: Aug 02, 1995, 28 y.o.   MRN: [id number]   Subjective:    She feels well.  Denies contractions.  Objective:    Patient Vitals for the past 2 hrs:  BP Temp Temp src Pulse  11/20/23 0953 (!) 144/101 -- -- 72  11/20/23 0944 (!) 144/101 -- -- 72  11/20/23 0914 130/63 -- -- (!) 59  11/20/23 0844 (!) 151/84 -- -- 62  11/20/23 0836 -- 97.9 F (36.6 C) Oral --   No intake/output data recorded.  Labs: Results for orders placed or performed during the hospital encounter of 11/19/23 (from the past 24 hours)  Protein / creatinine ratio, urine     Status: Abnormal   Collection Time: 11/19/23  5:46 PM  Result Value Ref Range   Creatinine, Urine 190 mg/dL   Total Protein, Urine 42 mg/dL   Protein Creatinine Ratio 0.22 (H) 0.00 - 0.15 mg/mg[Cre]  CBC     Status: Abnormal   Collection Time: 11/19/23  5:52 PM  Result Value Ref Range   WBC 9.3 4.0 - 10.5 K/uL   RBC 2.95 (L) 3.87 - 5.11 MIL/uL   Hemoglobin 10.7 (L) 12.0 - 15.0 g/dL   HCT 69.5 (L) 63.9 - 53.9 %   MCV 103.1 (H) 80.0 - 100.0 fL   MCH 36.3 (H) 26.0 - 34.0 pg   MCHC 35.2 30.0 - 36.0 g/dL   RDW 86.7 88.4 - 84.4 %   Platelets 215 150 - 400 K/uL   nRBC 0.0 0.0 - 0.2 %  Comprehensive metabolic panel     Status: Abnormal   Collection Time: 11/19/23  5:52 PM  Result Value Ref Range   Sodium 138 135 - 145 mmol/L   Potassium 4.0 3.5 - 5.1 mmol/L   Chloride 106 98 - 111 mmol/L   CO2 22 22 - 32 mmol/L   Glucose, Bld 63 (L) 70 - 99 mg/dL   BUN 9 6 - 20 mg/dL   Creatinine, Ser 9.28 0.44 - 1.00 mg/dL   Calcium  9.1 8.9 - 10.3 mg/dL   Total Protein 6.6 6.5 - 8.1 g/dL   Albumin 3.0 (L) 3.5 - 5.0 g/dL   AST 16 15 - 41 U/L   ALT 10 0 - 44 U/L   Alkaline Phosphatase 116 38 - 126 U/L   Total Bilirubin 0.4 0.0 - 1.2 mg/dL   GFR, Estimated >39 >39 mL/min   Anion gap 10 5 - 15  Type and screen [facility]     Status: None (Preliminary result)   Collection Time: 11/19/23  8:42  PM  Result Value Ref Range   ABO/RH(D) A NEG    Antibody Screen POS    Sample Expiration 11/22/2023,2359    Antibody Identification PASSIVELY ACQUIRED ANTI-D    Unit Number [id number]    Blood Component Type RED CELLS,LR    Unit division 00    Status of Unit ALLOCATED    Transfusion Status OK TO TRANSFUSE    Crossmatch Result COMPATIBLE   Prepare RBC (crossmatch)     Status: None   Collection Time: 11/20/23  9:20 AM  Result Value Ref Range   Order Confirmation      ORDER PROCESSED BY BLOOD BANK Performed at [facility], [address] [place], [place] [address]     Medications     Current Facility-Administered Medications:    acetaminophen  (TYLENOL ) tablet 1,000 mg, 1,000 mg, Oral, Q6H PRN, Sebastian Sham, CNM, 1,000  mg at 11/20/23 9164   calcium  carbonate (TUMS - dosed in mg elemental calcium ) chewable tablet 400 mg of elemental calcium , 2 tablet, Oral, Q4H PRN, Sebastian Sham, CNM   docusate sodium  (COLACE) capsule 100 mg, 100 mg, Oral, Daily, Sebastian Sham, CNM, 100 mg at 11/20/23 9047   labetalol  (NORMODYNE ) tablet 300 mg, 300 mg, Oral, BID, Thompson, Annie, CNM, 300 mg at 11/20/23 9046   lactated ringers  infusion, , Intravenous, Continuous, Sebastian Sham, CNM, Last Rate: 125 mL/hr at 11/19/23 1919, New Bag at 11/19/23 1919   lactated ringers  infusion, 125 mL/hr, Intravenous, On Call to OR, Sebastian Sham, CNM   NIFEdipine  (PROCARDIA -XL/NIFEDICAL-XL) 24 hr tablet 60 mg, 60 mg, Oral, Daily, Sebastian Sham, CNM, 60 mg at 11/20/23 9045   prenatal multivitamin tablet 1 tablet, 1 tablet, Oral, Q1200, Sebastian Sham, CNM   zolpidem  (AMBIEN ) tablet 5 mg, 5 mg, Oral, QHS PRN, Sebastian Sham, CNM  External fetal monitor became reactive through the night with some accelerations.  Currently no 15 x 15 accelerations for a few hours.  No decelerations present.  No contractions noted.  Reasonable variability.    Assessment:      33.4 intrauterine pregnancy  -growth previously noted at the 1st percentile  1.  Chronic hypertension on 2 medications.  Still having some elevated pressures but none in severe range.  Has ruled out for preeclampsia with lab work.  2.  Growth restricted infant at the 1st percentile with elevated SD ratio recently.  Limited fetal heart rate reactivity over the last 24 hours.  Some spontaneous decelerations noted yesterday.   She is in the middle of her steroid window.  Plan:    Complete 36 hours of steroids.  Because of her hypertension and more significantly at her nonreactive fetal heart rate tracing and occasional spontaneous decelerations with growth restriction -cesarean delivery is deemed indicated and will be performed tomorrow morning at 730. I have noted and discussed the case with Dr. Garon and he agrees with the current care plan.  I have spent some time discussing the care plan and the rationale for the plan with the patient.  She has expressed her understanding.  All of her questions were answered.  I have asked neonatology to speak with her as well and answer any additional questions she may have about the future care of the baby.  Alm DOROTHA Sar, M.D. 11/20/2023 10:15 AM

## 2023-11-20 NOTE — Anesthesia Preprocedure Evaluation (Signed)
 Anesthesia Evaluation  Patient identified by MRN, date of birth, ID band Patient awake    Reviewed: Allergy & Precautions, H&P , NPO status , Patient's Chart, lab work & pertinent test results  Airway Mallampati: II  TM Distance: >3 FB Neck ROM: full    Dental no notable dental hx.    Pulmonary neg pulmonary ROS   Pulmonary exam normal        Cardiovascular Exercise Tolerance: Good hypertension, Normal cardiovascular exam     Neuro/Psych    GI/Hepatic negative GI ROS,,,  Endo/Other    Renal/GU   negative genitourinary   Musculoskeletal   Abdominal  (+) + obese  Peds  Hematology  (+) Blood dyscrasia, anemia   Anesthesia Other Findings 33.4 intrauterine pregnancy -growth previously noted at the 1st percentile             1.  Chronic hypertension on 2 medications.  Still having some elevated pressures but none in severe range.  Has ruled out for preeclampsia with lab work.             2.  Growth restricted infant at the 1st percentile with elevated SD ratio recently.  Limited fetal heart rate reactivity over the last 24 hours.  Some spontaneous decelerations noted yesterday.   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 11/14/2021: B12 deficiency No date: Benign headache 07/15/2018: Dyslipidemia No date: Dysmenorrhea No date: Grieving No date: Hyperlipidemia due to dietary fat intake No date: Hypertension 04/20/2014: Marijuana use     Comment:  positive UDS No date: Microalbuminuria 10/08/2014: Migraine without aura and responsive to treatment No date: Migraine without aura and without status migrainosus, not  intractable 09/04/2021: Morbid obesity (HCC) No date: Morbid obesity due to excess calories (HCC)     Comment:  BMI 37-38 with HTN & HLD No date: Obesity 10/08/2014: Obesity (BMI 30.0-34.9)     Comment:  Patient to limit weight gain in pregnancy to 15 pounds   11/14/2021:  Palpitations 11/14/2021: Plantar fasciitis, left 07/15/2018: Tachycardia No date: Urticaria 11/14/2021: Vitamin D  deficiency 05/20/2021: Wrist pain, left  Past Surgical History: No date: NO PAST SURGERIES 07/30/2021: TRANSTHORACIC ECHOCARDIOGRAM     Comment:  HR 100 bpm. Normal LV size & fxn. EF 55-60%. No RWMA. Gr              1 DD? But normal LA size. Normal Valves. Normal RV, RVSP               & RAP.  BMI    Body Mass Index: 37.25 kg/m      Reproductive/Obstetrics (+) Pregnancy                              Anesthesia Physical Anesthesia Plan  ASA: 2  Anesthesia Plan: Spinal   Post-op Pain Management:    Induction:   PONV Risk Score and Plan: Ondansetron   Airway Management Planned:   Additional Equipment:   Intra-op Plan:   Post-operative Plan:   Informed Consent: I have reviewed the patients History and Physical, chart, labs and discussed the procedure including the risks, benefits and alternatives for the proposed anesthesia with the patient or authorized representative who has indicated his/her understanding and acceptance.     Dental Advisory Given  Plan Discussed with: Anesthesiologist and CRNA  Anesthesia Plan Comments:          Anesthesia Quick Evaluation

## 2023-11-20 NOTE — Progress Notes (Addendum)
 Progress NOTE   Lori Gross 28 y.o.GP@ at [redacted]w[redacted]d  SUBJECTIVE:  Pt resting   OBJECTIVE:  BP (!) 146/91   Pulse 69   Temp 98.2 F (36.8 C) (Oral)   Resp 16   Ht 5' 4 (1.626 m)   Wt 98.4 kg   LMP 03/30/2023   BMI 37.25 kg/m  No intake/output data recorded.   CONTRACTIONS: none FHR: Fetal heart tracing reviewed. Baseline: 125 bpm, Variability: Good {> 6 bpm), Accelerations: non reactive, and Decelerations: Absent Category I    Labs: Lab Results  Component Value Date   WBC 9.3 11/19/2023   HGB 10.7 (L) 11/19/2023   HCT 30.4 (L) 11/19/2023   MCV 103.1 (H) 11/19/2023   PLT 215 11/19/2023    ASSESSMENT: A few episodes of reactive strip noted throughout the night.  She had one additional deceleration for 80 seconds with the  FHR decreasing to 65. Pt was noted to be in the bathroom at the time of the deceleration.          Active Problems:   Labor and delivery, indication for care   PLAN: Continue current plan . Dr. Janit aware of strip and agrees to current plan.    Zelda Hummer, CNM  11/20/2023 6:32 AM

## 2023-11-21 ENCOUNTER — Other Ambulatory Visit: Payer: Self-pay

## 2023-11-21 ENCOUNTER — Encounter
Admission: EM | Disposition: A | Discharge status: Home / Self Care | Payer: Self-pay | Source: Home / Self Care | Attending: Obstetrics and Gynecology

## 2023-11-21 ENCOUNTER — Inpatient Hospital Stay: Payer: Self-pay | Admitting: Registered Nurse

## 2023-11-21 ENCOUNTER — Encounter: Payer: Self-pay | Admitting: Obstetrics and Gynecology

## 2023-11-21 DIAGNOSIS — O114 Pre-existing hypertension with pre-eclampsia, complicating childbirth: Secondary | ICD-10-CM

## 2023-11-21 DIAGNOSIS — O365930 Maternal care for other known or suspected poor fetal growth, third trimester, not applicable or unspecified: Secondary | ICD-10-CM | POA: Diagnosis not present

## 2023-11-21 DIAGNOSIS — O368330 Maternal care for abnormalities of the fetal heart rate or rhythm, third trimester, not applicable or unspecified: Secondary | ICD-10-CM | POA: Diagnosis not present

## 2023-11-21 LAB — CBC
HCT: 29.3 % — ABNORMAL LOW (ref 36.0–46.0)
Hemoglobin: 9.8 g/dL — ABNORMAL LOW (ref 12.0–15.0)
MCH: 35.5 pg — ABNORMAL HIGH (ref 26.0–34.0)
MCHC: 33.4 g/dL (ref 30.0–36.0)
MCV: 106.2 fL — ABNORMAL HIGH (ref 80.0–100.0)
Platelets: 190 K/uL (ref 150–400)
RBC: 2.76 MIL/uL — ABNORMAL LOW (ref 3.87–5.11)
RDW: 13.6 % (ref 11.5–15.5)
WBC: 13.2 K/uL — ABNORMAL HIGH (ref 4.0–10.5)
nRBC: 0.2 % (ref 0.0–0.2)

## 2023-11-21 SURGERY — Surgical Case
Anesthesia: Spinal

## 2023-11-21 MED ORDER — NALOXONE HCL 4 MG/10ML IJ SOLN: 1.0000 ug/kg/h | INTRAVENOUS | Status: DC | PRN

## 2023-11-21 MED ORDER — LABETALOL HCL 5 MG/ML IV SOLN: 20.0000 mg | INTRAVENOUS | Status: DC | PRN

## 2023-11-21 MED ORDER — SODIUM CHLORIDE 0.9% FLUSH: 3.0000 mL | INTRAVENOUS | Status: DC | PRN

## 2023-11-21 MED ORDER — OXYTOCIN-SODIUM CHLORIDE 30-0.9 UT/500ML-% IV SOLN
INTRAVENOUS | Status: DC | PRN
  Administered 2023-11-21: 400 mL via INTRAVENOUS

## 2023-11-21 MED ORDER — MORPHINE SULFATE (PF) 0.5 MG/ML IJ SOLN
INTRAMUSCULAR | Status: AC
  Filled 2023-11-21: qty 10

## 2023-11-21 MED ORDER — HYDRALAZINE HCL 20 MG/ML IJ SOLN
INTRAMUSCULAR | Status: AC
  Administered 2023-11-21: 5 mg via INTRAVENOUS
  Filled 2023-11-21: qty 1

## 2023-11-21 MED ORDER — LABETALOL HCL 5 MG/ML IV SOLN: 40.0000 mg | INTRAVENOUS | Status: DC | PRN

## 2023-11-21 MED ORDER — ONDANSETRON HCL 4 MG/2ML IJ SOLN
4.0000 mg | Freq: Three times a day (TID) | INTRAMUSCULAR | Status: DC | PRN
Start: 2023-11-21 — End: 2023-11-25

## 2023-11-21 MED ORDER — DIPHENHYDRAMINE HCL 25 MG PO CAPS: 25.0000 mg | ORAL_CAPSULE | Freq: Four times a day (QID) | ORAL | Status: DC | PRN

## 2023-11-21 MED ORDER — MEPERIDINE HCL 25 MG/ML IJ SOLN: 6.2500 mg | INTRAMUSCULAR | Status: DC | PRN

## 2023-11-21 MED ORDER — OXYCODONE HCL 5 MG PO TABS: 5.0000 mg | ORAL_TABLET | Freq: Four times a day (QID) | ORAL | Status: DC | PRN

## 2023-11-21 MED ORDER — PHENYLEPHRINE HCL-NACL 20-0.9 MG/250ML-% IV SOLN
INTRAVENOUS | Status: DC | PRN
  Administered 2023-11-21: 30 ug/min via INTRAVENOUS

## 2023-11-21 MED ORDER — POVIDONE-IODINE 10 % EX SWAB: 2.0000 | Freq: Once | CUTANEOUS | Status: DC

## 2023-11-21 MED ORDER — SCOPOLAMINE 1 MG/3DAYS TD PT72: 1.0000 | MEDICATED_PATCH | Freq: Once | TRANSDERMAL | Status: DC

## 2023-11-21 MED ORDER — HYDRALAZINE HCL 20 MG/ML IJ SOLN: 5.0000 mg | INTRAMUSCULAR | Status: DC | PRN

## 2023-11-21 MED ORDER — DEXAMETHASONE SODIUM PHOSPHATE 10 MG/ML IJ SOLN
INTRAMUSCULAR | Status: DC | PRN
Start: 2023-11-21 — End: 2023-11-21
  Administered 2023-11-21: 10 mg via INTRAVENOUS

## 2023-11-21 MED ORDER — DIPHENHYDRAMINE HCL 50 MG/ML IJ SOLN: 12.5000 mg | INTRAMUSCULAR | Status: DC | PRN

## 2023-11-21 MED ORDER — ACETAMINOPHEN 500 MG PO TABS
1000.0000 mg | ORAL_TABLET | Freq: Four times a day (QID) | ORAL | Status: AC
  Administered 2023-11-21 – 2023-11-22 (×4): 1000 mg via ORAL
  Filled 2023-11-21 (×4): qty 2

## 2023-11-21 MED ORDER — DEXAMETHASONE SODIUM PHOSPHATE 10 MG/ML IJ SOLN
INTRAMUSCULAR | Status: AC
  Filled 2023-11-21: qty 1

## 2023-11-21 MED ORDER — PRENATAL MULTIVITAMIN CH
1.0000 | ORAL_TABLET | Freq: Every day | ORAL | Status: DC
  Filled 2023-11-21 (×2): qty 1

## 2023-11-21 MED ORDER — ONDANSETRON HCL 4 MG/2ML IJ SOLN
INTRAMUSCULAR | Status: DC | PRN
  Administered 2023-11-21: 4 mg via INTRAVENOUS

## 2023-11-21 MED ORDER — BUPIVACAINE IN DEXTROSE 0.75-8.25 % IT SOLN
INTRATHECAL | Status: DC | PRN
  Administered 2023-11-21: 1.6 mL via INTRATHECAL

## 2023-11-21 MED ORDER — LACTATED RINGERS IV SOLN: INTRAVENOUS | Status: DC

## 2023-11-21 MED ORDER — OXYTOCIN-SODIUM CHLORIDE 30-0.9 UT/500ML-% IV SOLN
INTRAVENOUS | Status: AC
  Filled 2023-11-21: qty 500

## 2023-11-21 MED ORDER — ONDANSETRON HCL 4 MG/2ML IJ SOLN
INTRAMUSCULAR | Status: AC
  Filled 2023-11-21: qty 2

## 2023-11-21 MED ORDER — FENTANYL CITRATE (PF) 100 MCG/2ML IJ SOLN
INTRAMUSCULAR | Status: DC | PRN
  Administered 2023-11-21: 15 ug via INTRATHECAL

## 2023-11-21 MED ORDER — SENNOSIDES-DOCUSATE SODIUM 8.6-50 MG PO TABS
2.0000 | ORAL_TABLET | ORAL | Status: DC
  Administered 2023-11-21 – 2023-11-24 (×4): 2 via ORAL
  Filled 2023-11-21 (×4): qty 2

## 2023-11-21 MED ORDER — CEFAZOLIN SODIUM-DEXTROSE 2-4 GM/100ML-% IV SOLN
2.0000 g | INTRAVENOUS | Status: AC
  Administered 2023-11-21: 2 g via INTRAVENOUS
  Filled 2023-11-21: qty 100

## 2023-11-21 MED ORDER — KETOROLAC TROMETHAMINE 30 MG/ML IJ SOLN: 30.0000 mg | Freq: Four times a day (QID) | INTRAMUSCULAR | Status: AC | PRN

## 2023-11-21 MED ORDER — KETOROLAC TROMETHAMINE 30 MG/ML IJ SOLN
INTRAMUSCULAR | Status: DC | PRN
  Administered 2023-11-21: 30 mg via INTRAVENOUS

## 2023-11-21 MED ORDER — OXYTOCIN-SODIUM CHLORIDE 30-0.9 UT/500ML-% IV SOLN
2.5000 [IU]/h | INTRAVENOUS | Status: AC
  Administered 2023-11-21: 2.5 [IU]/h via INTRAVENOUS

## 2023-11-21 MED ORDER — SOD CITRATE-CITRIC ACID 500-334 MG/5ML PO SOLN
30.0000 mL | Freq: Once | ORAL | Status: AC
  Administered 2023-11-21: 30 mL via ORAL

## 2023-11-21 MED ORDER — IBUPROFEN 600 MG PO TABS
600.0000 mg | ORAL_TABLET | Freq: Four times a day (QID) | ORAL | Status: AC
  Administered 2023-11-21 – 2023-11-24 (×10): 600 mg via ORAL
  Filled 2023-11-21 (×11): qty 1

## 2023-11-21 MED ORDER — MENTHOL 3 MG MT LOZG: 1.0000 | LOZENGE | OROMUCOSAL | Status: DC | PRN

## 2023-11-21 MED ORDER — HYDRALAZINE HCL 20 MG/ML IJ SOLN
10.0000 mg | INTRAMUSCULAR | Status: DC | PRN
  Administered 2023-11-21: 10 mg via INTRAVENOUS

## 2023-11-21 MED ORDER — KETOROLAC TROMETHAMINE 30 MG/ML IJ SOLN
30.0000 mg | Freq: Four times a day (QID) | INTRAMUSCULAR | Status: AC | PRN
  Administered 2023-11-21: 30 mg via INTRAVENOUS
  Filled 2023-11-21: qty 1

## 2023-11-21 MED ORDER — FENTANYL CITRATE (PF) 100 MCG/2ML IJ SOLN
INTRAMUSCULAR | Status: AC
  Filled 2023-11-21: qty 2

## 2023-11-21 MED ORDER — NALOXONE HCL 0.4 MG/ML IJ SOLN: 0.4000 mg | INTRAMUSCULAR | Status: DC | PRN

## 2023-11-21 MED ORDER — LIDOCAINE 5 % EX PTCH
MEDICATED_PATCH | CUTANEOUS | Status: AC
  Filled 2023-11-21: qty 1

## 2023-11-21 MED ORDER — OXYCODONE-ACETAMINOPHEN 5-325 MG PO TABS: 1.0000 | ORAL_TABLET | ORAL | Status: DC | PRN

## 2023-11-21 MED ORDER — SIMETHICONE 80 MG PO CHEW
80.0000 mg | CHEWABLE_TABLET | Freq: Four times a day (QID) | ORAL | Status: DC
  Administered 2023-11-21 – 2023-11-24 (×9): 80 mg via ORAL
  Filled 2023-11-21 (×10): qty 1

## 2023-11-21 MED ORDER — SOD CITRATE-CITRIC ACID 500-334 MG/5ML PO SOLN
ORAL | Status: AC
  Filled 2023-11-21: qty 15

## 2023-11-21 MED ORDER — MORPHINE SULFATE (PF) 0.5 MG/ML IJ SOLN
INTRAMUSCULAR | Status: DC | PRN
  Administered 2023-11-21: .1 mg via INTRATHECAL

## 2023-11-21 MED ORDER — PHENYLEPHRINE HCL-NACL 20-0.9 MG/250ML-% IV SOLN
INTRAVENOUS | Status: AC
  Filled 2023-11-21: qty 250

## 2023-11-21 MED ORDER — DIPHENHYDRAMINE HCL 25 MG PO CAPS: 25.0000 mg | ORAL_CAPSULE | ORAL | Status: DC | PRN

## 2023-11-21 MED ORDER — LIDOCAINE 5 % EX PTCH
MEDICATED_PATCH | CUTANEOUS | Status: DC | PRN
  Administered 2023-11-21: 1 via TRANSDERMAL

## 2023-11-21 SURGICAL SUPPLY — 24 items
ADHESIVE MASTISOL STRL (MISCELLANEOUS) ×1 IMPLANT
BAG COUNTER SPONGE SURGICOUNT (BAG) ×1 IMPLANT
BENZOIN TINCTURE PRP APPL 2/3 (GAUZE/BANDAGES/DRESSINGS) IMPLANT
CHLORAPREP W/TINT 26 (MISCELLANEOUS) ×2 IMPLANT
DRSG TELFA 3X8 NADH STRL (GAUZE/BANDAGES/DRESSINGS) ×1 IMPLANT
GAUZE SPONGE 4X4 12PLY STRL (GAUZE/BANDAGES/DRESSINGS) ×1 IMPLANT
GLOVE PI ORTHO PRO STRL 7.5 (GLOVE) ×1 IMPLANT
GOWN STRL REUS W/ TWL LRG LVL3 (GOWN DISPOSABLE) ×2 IMPLANT
KIT TURNOVER KIT A (KITS) ×1 IMPLANT
MANIFOLD NEPTUNE II (INSTRUMENTS) ×1 IMPLANT
MAT PREVALON FULL STRYKER (MISCELLANEOUS) ×1 IMPLANT
NS IRRIG 1000ML POUR BTL (IV SOLUTION) ×1 IMPLANT
PACK C SECTION AR (MISCELLANEOUS) ×1 IMPLANT
PAD OB MATERNITY 11 LF (PERSONAL CARE ITEMS) ×1 IMPLANT
PAD PREP OB/GYN DISP 24X41 (PERSONAL CARE ITEMS) ×1 IMPLANT
RETRACTOR TRAXI PANNICULUS (MISCELLANEOUS) IMPLANT
RETRACTOR WND ALEXIS-O 25 LRG (MISCELLANEOUS) ×1 IMPLANT
SCRUB CHG 4% DYNA-HEX 4OZ (MISCELLANEOUS) ×1 IMPLANT
SPONGE T-LAP 18X18 ~~LOC~~+RFID (SPONGE) ×1 IMPLANT
SUT VIC AB 0 CTX36XBRD ANBCTRL (SUTURE) ×2 IMPLANT
SUT VIC AB 1 CT1 36 (SUTURE) ×2 IMPLANT
SUT VICRYL+ 3-0 36IN CT-1 (SUTURE) ×2 IMPLANT
TRAP FLUID SMOKE EVACUATOR (MISCELLANEOUS) ×1 IMPLANT
WATER STERILE IRR 500ML POUR (IV SOLUTION) ×1 IMPLANT

## 2023-11-21 NOTE — Transfer of Care (Signed)
  Anesthesia Post-op Note  Patient: Lori Gross  Procedure(s) Performed: Procedure(s): CESAREAN DELIVERY (N/A)  Patient Location: Mother/Baby  Anesthesia Type:Spinal  Level of Consciousness: awake, alert  and oriented  Airway and Oxygen Therapy: Patient Spontanous Breathing  Post-op Pain: none  Post-op Assessment: Post-op Vital signs reviewed  Post-op Vital Signs: Reviewed and stable  Last Vitals:  Vitals:   11/21/23 0707 11/21/23 0915  BP: (!) 147/88 (!) 140/87  Pulse: 70   Resp: 18 12  Temp: 36.8 C 36.7 C  SpO2:  99%    Complications: No apparent anesthesia complications

## 2023-11-21 NOTE — Op Note (Signed)
     OP NOTE  Date: 11/21/2023   7:33 AM Name Lori Gross MR# [id number]  Preoperative Diagnosis: 1. Intrauterine pregnancy at [redacted]w[redacted]d Active Problems:   Labor and delivery, indication for care  2.  IUGR 1% 3. Chronic HTN 4. Increased S/D ratio 5. Non- reassuring FHR   Postoperative Diagnosis: 1. Intrauterine pregnancy at [redacted]w[redacted]d, delivered 2. Viable infant 3. Remainder same as pre-op   Procedure: 1. Primary Low-Transverse Cesarean Section  Surgeon: Alm DOROTHA Sar, MD  Assistant:  Jayne HOWARD  Anesthesia:  Spinal   EBL: 0 ml     Findings: 1) female infant, Apgar scores of    at 1 minute and    at 5 minutes and a birthweight of   ounces.    2) Normal uterus, tubes and ovaries.    Procedure:  The patient was prepped and draped in the supine position and placed under spinal anesthesia.  A transverse incision was made across the abdomen in a Pfannenstiel manner. If indicated the old scar was systematically removed with sharp dissection.  We carried the dissection down to the level of the fascia.  The fascia was incised in a curvilinear manner.  The fascia was then elevated from the rectus muscles with blunt and sharp dissection.  The rectus muscles were separated laterally exposing the peritoneum.  The peritoneum was carefully entered with care being taken to avoid bowel and bladder.  A self-retaining retractor was placed.  The visceral peritoneum was incised in a curvilinear fashion across the lower uterine segment creating a bladder flap. A transverse incision was made across the lower uterine segment and extended laterally and superiorly with blunt dissection.  Artificial rupture membranes was performed and a small amount of clear fluid was noted.  The infant was delivered from the cephalic position.  A true knot was present in the cord. After an appropriate time interval, the cord was doubly clamped and cut. Cord blood was obtained if required.  The infant was handed to the  pediatric personnel  who then placed the infant under heat lamps where it was cleaned dried and suctioned as needed. The placenta was delivered. The hysterotomy incision was then identified on ring forceps.  The uterine cavity was cleaned with a moist lap sponge.  The hysterotomy incision was closed with a running interlocking suture of Vicryl.  Hemostasis was excellent.  Pitocin  was run in the IV and the uterus was found to be firm. The posterior cul-de-sac and gutters were cleaned and inspected.  Hemostasis was noted.  The fascia was then closed with a running suture of #1 Vicryl.  Hemostasis of the subcutaneous tissues was obtained using the Bovie.  The subcutaneous tissues were closed with a running suture of 000 Vicryl.  A subcuticular suture was placed.  Steri-strips were applied in the usual manner.  A Lidoderm  patch was applied.  A pressure dressing was placed.  The patient went to the recovery room in stable condition. Jayne CNM provided exposure, dissection, suctioning, retraction, and general support and assistance during the procedure.   Alm DOROTHA Sar, M.D. 11/21/2023 7:33 AM

## 2023-11-21 NOTE — Consult Note (Signed)
 [facility]   Prenatal Consult      11/20/2023   11:20 AM  I was asked by Dr. Janit to consult on this patient for possible preterm delivery. I had the pleasure of meeting with Lori Gross today.   She is a 28 yo [id number] female presenting at [redacted]w[redacted]d IUP with concerns for nonreassuring NST and continued NRFHT. Pregnancy has also been complicated by cHTN, IUGR. She has received BMZ x2 (last dose 0828 this morning). Most recent estimated fetal weight of 1534 grams on 7/29. She has chosen to name her baby Lori Gross.   We discussed the possible needs for an infant born at this gestation. I explained that the neonatal intensive care team would be present for the delivery and outlined the likely delivery room course for this baby including routine resuscitation and NRP-guided approaches to the treatment of respiratory distress. We discussed the potential need for CPAP, and less likely mechanical ventilation and surfactant administration, for respiratory distress, IV fluids pending establishment of enteral feeds, btemperature support, and monitoring. We discussed other common problems associated with prematurity including respiratory distress syndrome, feeding issues, temperature regulation, infection risk, and a low risk of long term developmental delays.  We discussed the importance of good nutrition and various methods of providing nutrition (parenteral hyperalimentation, gavage feedings and/or oral feeding). We discussed the benefits of human milk. I encouraged breast feeding and pumping soon after birth and outlined resources that are available to support breast feeding. We discussed the possibility of using donor breast milk as a bridge; she is not currently sure if she plans to breastfeed, but she consented to the use of DBM if needed given his prematurity.  We discussed the average length of stay but I noted that the actual LOS would depend on the severity of problems encountered and  response to treatments. We discussed visitation policies and the resources available while her child is in the hospital.  She expressed understanding and agreement with the plan for resuscitation and intensive care. All questions were answered.  Thank you for involving us  in the care of this patient. A member of our team will be available should the family have additional questions.   Alyce Kirsch, MD Neonatal Medicine  I spent ~40 minutes in consultation time, of which 25 minutes was spent in direct face to face counseling.

## 2023-11-21 NOTE — Anesthesia Procedure Notes (Signed)
 Date/Time: 11/21/2023 8:00 AM  Performed by: Tod Handing, CRNAPre-anesthesia Checklist: Patient identified, Emergency Drugs available, Suction available and Patient being monitored Patient Re-evaluated:Patient Re-evaluated prior to induction Oxygen Delivery Method: Nasal cannula Induction Type: IV induction Dental Injury: Teeth and Oropharynx as per pre-operative assessment  Comments: Nasal cannula with etCO2 monitoring

## 2023-11-21 NOTE — Anesthesia Procedure Notes (Signed)
 Spinal  Patient location during procedure: OR Start time: 11/21/2023 7:56 AM End time: 11/21/2023 7:59 AM Reason for block: surgical anesthesia Staffing Performed: anesthesiologist  Anesthesiologist: Vicci Camellia Glatter, MD Performed by: Tod Handing, CRNA Authorized by: Vicci Camellia Glatter, MD   Preanesthetic Checklist Completed: patient identified, IV checked, site marked, risks and benefits discussed, surgical consent, monitors and equipment checked, pre-op evaluation and timeout performed Spinal Block Patient position: sitting Prep: ChloraPrep Patient monitoring: heart rate, continuous pulse ox, blood pressure and cardiac monitor Approach: midline Location: L3-4 Injection technique: single-shot Needle Needle type: Introducer and Pencan  Needle gauge: 24 G Needle length: 9 cm Assessment Sensory level: T6 Events: CSF return Additional Notes Sterile aseptic technique used throughout the procedure.  Negative paresthesia. Negative blood return. Positive free-flowing CSF. Expiration date of kit checked and confirmed. Patient tolerated procedure well, without complications.

## 2023-11-21 NOTE — Interval H&P Note (Signed)
 History and Physical Interval Note:  11/21/2023 7:29 AM  Lori Gross  has presented today for surgery, with the diagnosis of Unscheduled Cesarean Section, See Delivery Summary.  The various methods of treatment have been discussed with the patient and family. After consideration of risks, benefits and other options for treatment, the patient has consented to  Procedure(s): CESAREAN DELIVERY (N/A) as a surgical intervention.  The patient's history has been reviewed, patient examined, no change in status, stable for surgery.  I have reviewed the patient's chart and labs.  Questions were answered to the patient's satisfaction.   See notes from 8/2  Alm Sar
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
## 2023-11-21 NOTE — TOC Initial Note (Signed)
 Transition of Care [facility]) - Initial/Assessment Note    Patient Details  Name: Lori Gross MRN: [id number] Date of Birth: [birth year]  [facility] CM/SW Contact:    [place] L Erienne Spelman, LCSW Phone Number: 11/21/2023, 10:07 AM  Clinical Narrative:                  Resources for financial assistance added to the AVS for patient. TOC Consult was not clear about the type of financial assistance needed for patient.        Patient Goals and CMS Choice            Expected Discharge Plan and Services                                              Prior Living Arrangements/Services                       Activities of Daily Living   ADL Screening (condition at time of admission) Independently performs ADLs?: Yes (appropriate for developmental age) Is the patient deaf or have difficulty hearing?: No Does the patient have difficulty seeing, even when wearing glasses/contacts?: No Does the patient have difficulty concentrating, remembering, or making decisions?: No  Permission Sought/Granted                  Emotional Assessment              Admission diagnosis:  Labor and delivery, indication for care [O75.9] Patient Active Problem List   Diagnosis Date Noted   Labor and delivery, indication for care 11/19/2023   Obesity affecting pregnancy, antepartum 11/15/2023   Fetal growth restriction antepartum 11/10/2023   Anemia affecting pregnancy in third trimester 10/14/2023   Abdominal pain affecting pregnancy 08/19/2023   Chronic hypertension affecting pregnancy 07/21/2023   Supervision of high-risk pregnancy 05/17/2023   Systolic murmur 07/03/2021   Hypertension, benign 11/20/2015   Rh negative state in antepartum period 08/27/2015   PCP:  Glenard Mire, MD Pharmacy:   [facility] [phone] [address] [place], [place] - [address] [place] [address] Phone: [phone] Fax: [phone]  [id number] [facility] - [address] [place] [address] Phone: [phone] Fax: [phone]     Social Drivers of Health (SDOH) Social History: SDOH Screenings   Food Insecurity: No Food Insecurity (11/20/2023)  Housing: Low Risk  (11/20/2023)  Transportation Needs: No Transportation Needs (11/20/2023)  Utilities: Not At Risk (11/20/2023)  Alcohol Screen: Low Risk  (12/16/2022)  Depression (PHQ2-9): Low Risk  (06/23/2023)  Financial Resource Strain: Low Risk  (09/20/2023)   Received from [facility]  Physical Activity: Insufficiently Active (05/17/2023)  Social Connections: Socially Isolated (11/20/2023)  Stress: No Stress Concern Present (05/17/2023)  Tobacco Use: Low Risk  (11/19/2023)  Health Literacy: Adequate Health Literacy (05/17/2023)   SDOH Interventions:     Readmission Risk Interventions     No data to display

## 2023-11-22 ENCOUNTER — Other Ambulatory Visit

## 2023-11-22 ENCOUNTER — Encounter: Payer: Self-pay | Admitting: Obstetrics and Gynecology

## 2023-11-22 ENCOUNTER — Ambulatory Visit

## 2023-11-22 ENCOUNTER — Telehealth: Payer: Self-pay | Admitting: Obstetrics and Gynecology

## 2023-11-22 LAB — CBC
HCT: 27.7 % — ABNORMAL LOW (ref 36.0–46.0)
Hemoglobin: 9.7 g/dL — ABNORMAL LOW (ref 12.0–15.0)
MCH: 37.2 pg — ABNORMAL HIGH (ref 26.0–34.0)
MCHC: 35 g/dL (ref 30.0–36.0)
MCV: 106.1 fL — ABNORMAL HIGH (ref 80.0–100.0)
Platelets: 170 K/uL (ref 150–400)
RBC: 2.61 MIL/uL — ABNORMAL LOW (ref 3.87–5.11)
RDW: 13.7 % (ref 11.5–15.5)
WBC: 13.4 K/uL — ABNORMAL HIGH (ref 4.0–10.5)
nRBC: 0 % (ref 0.0–0.2)

## 2023-11-22 LAB — FETAL SCREEN: Fetal Screen: NEGATIVE

## 2023-11-22 LAB — RPR: RPR Ser Ql: NONREACTIVE

## 2023-11-22 MED ORDER — RHO D IMMUNE GLOBULIN 1500 UNIT/2ML IJ SOSY
300.0000 ug | PREFILLED_SYRINGE | Freq: Once | INTRAMUSCULAR | Status: AC
  Administered 2023-11-22: 300 ug via INTRAVENOUS
  Filled 2023-11-22: qty 2
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
## 2023-11-22 NOTE — Progress Notes (Signed)
 Progress Note - Cesarean Delivery  Lori Gross is a 28 y.o. [phone] now PP day 1 s/p C-Section, Low Transverse.   Subjective:  Patient reports no problems with eating, bowel movements, voiding, or their wound  Reports no pain this AM  Baby in NICU - mom says doing well  Objective:  Vital signs in last 24 hours: Temp:  [98 F (36.7 C)-98.6 F (37 C)] 98.2 F (36.8 C) (08/04 0315) Pulse Rate:  [48-73] 65 (08/04 0315) Resp:  [7-20] 18 (08/04 0315) BP: (129-173)/(79-102) 132/83 (08/04 0315) SpO2:  [94 %-100 %] 98 % (08/04 0315)  Physical Exam:  General: alert, cooperative, and no distress Lochia: appropriate Uterine Fundus: firm Incision: dressing intact - dry    Data Review Recent Labs    11/21/23 0742 11/22/23 0434  HGB 9.8* 9.7*  HCT 29.3* 27.7*    Assessment:  Active Problems:   Labor and delivery, indication for care   Status post Cesarean section. Doing well postoperatively.   Pulse improved today  BP improved  Plan:       Continue current care.  Anti-hypertensives as order if pulse above 65  OOB - may shower  Dressing change    Alm DOROTHA Sar, M.D. 11/22/2023 7:30 AM

## 2023-11-22 NOTE — Anesthesia Postprocedure Evaluation (Signed)
 Anesthesia Post Note  Patient: Lori Gross  Procedure(s) Performed: CESAREAN DELIVERY  Patient location during evaluation: Mother Baby Anesthesia Type: Spinal Level of consciousness: awake and alert Pain management: pain level controlled Vital Signs Assessment: post-procedure vital signs reviewed and stable Respiratory status: spontaneous breathing, nonlabored ventilation and respiratory function stable Cardiovascular status: stable Postop Assessment: no headache, no backache and epidural receding Anesthetic complications: no   No notable events documented.   Last Vitals:  Vitals:   11/22/23 0142 11/22/23 0315  BP:  132/83  Pulse:  65  Resp:  18  Temp:  36.8 C  SpO2: 96% 98%    Last Pain:  Vitals:   11/22/23 0602  TempSrc:   PainSc: 0-No pain                 [name]

## 2023-11-22 NOTE — Telephone Encounter (Signed)
 Reached out to pt to schedule 1 week incision check and bp check.  Evans delivered on 11/21/2023.  Also need to schedule 6 week pp visit with pt.  Left message for pt to call back.  (She was currently hospitalized.)

## 2023-11-22 NOTE — Anesthesia Post-op Follow-up Note (Signed)
  Anesthesia Pain Follow-up Note  Patient: Lori Gross  Day #: 1  Date of Follow-up: 11/22/2023 Time: 7:50 AM  Last Vitals:  Vitals:   11/22/23 0142 11/22/23 0315  BP:  132/83  Pulse:  65  Resp:  18  Temp:  36.8 C  SpO2: 96% 98%    Level of Consciousness: alert  Pain: mild   Side Effects:None  Catheter Site Exam:clean     Plan: D/C from anesthesia care at surgeon's request  Leontine Morene SQUIBB

## 2023-11-23 LAB — TYPE AND SCREEN
ABO/RH(D): A NEG
Antibody Screen: POSITIVE
Unit division: 0

## 2023-11-23 LAB — RHOGAM INJECTION: Unit division: 0

## 2023-11-23 LAB — BPAM RBC
Blood Product Expiration Date: 202508142359
ISSUE DATE / TIME: 202507222222
Unit Type and Rh: 600

## 2023-11-23 MED ORDER — NIFEDIPINE ER OSMOTIC RELEASE 30 MG PO TB24
90.0000 mg | ORAL_TABLET | Freq: Every day | ORAL | Status: DC
  Administered 2023-11-23 – 2023-11-24 (×2): 90 mg via ORAL
  Filled 2023-11-23 (×2): qty 3

## 2023-11-23 NOTE — Progress Notes (Signed)
 Obstetric Postpartum/PostOperative Daily Progress Note Subjective:  28 y.o. [id number] post-operative day # 2 status post primary cesarean delivery.  She is ambulating, is tolerating po, is voiding spontaneously.  Her pain is well controlled on PO pain medications. Her lochia is less than menses.   Medications SCHEDULED MEDICATIONS   docusate sodium   100 mg Oral Daily   ibuprofen   600 mg Oral Q6H   labetalol   300 mg Oral BID   NIFEdipine   90 mg Oral Daily   prenatal multivitamin  1 tablet Oral Q1200   prenatal multivitamin  1 tablet Oral Q1200   scopolamine   1 patch Transdermal Once   senna-docusate  2 tablet Oral Q24H   simethicone   80 mg Oral QID    MEDICATION INFUSIONS   lactated ringers  150 mL/hr at 11/21/23 1212   naloxone  HCl (NARCAN ) 2 mg in dextrose  5 % 250 mL infusion      PRN MEDICATIONS  acetaminophen , calcium  carbonate, diphenhydrAMINE , diphenhydrAMINE  **OR** diphenhydrAMINE , hydrALAZINE  **AND** hydrALAZINE  **AND** labetalol  **AND** labetalol  **AND** Measure blood pressure, menthol -cetylpyridinium, naloxone  **AND** sodium chloride  flush, naloxone  HCl (NARCAN ) 2 mg in dextrose  5 % 250 mL infusion, ondansetron  (ZOFRAN ) IV, oxyCODONE , oxyCODONE -acetaminophen , zolpidem     Objective:   Vitals:   11/23/23 0353 11/23/23 0355 11/23/23 0752 11/23/23 0810  BP: (!) 155/94 (!) 141/93 (!) 158/96 (!) 155/98  Pulse: 70 65 78 70  Resp: 18  18   Temp: 98.2 F (36.8 C)  98.5 F (36.9 C)   TempSrc: Oral  Oral   SpO2: 98% 98%  98%  Weight:      Height:        Current Vital Signs 24h Vital Sign Ranges  T 98.5 F (36.9 C) Temp  Avg: 98.3 F (36.8 C)  Min: 98.1 F (36.7 C)  Max: 98.6 F (37 C)  BP (!) 155/98 BP  Min: 127/83  Max: 158/96  HR 70 Pulse  Avg: 76.6  Min: 65  Max: 92  RR 18 Resp  Avg: 18.7  Min: 18  Max: 20  SaO2 98 % Room Air SpO2  Avg: 98.7 %  Min: 98 %  Max: 100 %       24 Hour I/O Current Shift I/O  Time Ins Outs No intake/output data recorded. No intake/output  data recorded.  General: NAD Pulmonary: no increased work of breathing Abdomen: non-distended, non-tender, fundus firm at level of umbilicus Inc: Clean/dry/intact Extremities: trace edema, no erythema, no tenderness  Labs:  Recent Labs  Lab 11/19/23 1752 11/21/23 0742 11/22/23 0434  WBC 9.3 13.2* 13.4*  HGB 10.7* 9.8* 9.7*  HCT 30.4* 29.3* 27.7*  PLT 215 190 170     Assessment:   28 y.o. [id number] postoperative day # 2 status post primary cesarean section Continued MRBP requiring adjustment of medication Acute blood loss anemia, asymptomatic, not clinically significant  Plan:  1) Acute blood loss anemia - hemodynamically stable and asymptomatic - po ferrous sulfate   2) A NEG / Rubella 1.11 (03/05 1124)/ Varicella Immune  3) TDAP status: received 6/25  4) bottle /Contraception = oral contraceptives (estrogen/progesterone)  5) cHTN with elevated blood pressures: continue Labetalol  at 300mg  bid, increase Nifedipine  to 90mg  daily  6) Disposition: anticipate discharge home in next 1-2 days dependent on BP control  Harlene LITTIE Cisco, CNM
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
## 2023-11-24 ENCOUNTER — Other Ambulatory Visit: Payer: Self-pay

## 2023-11-24 MED ORDER — OXYCODONE-ACETAMINOPHEN 5-325 MG PO TABS
1.0000 | ORAL_TABLET | ORAL | 0 refills | Status: DC | PRN
  Filled 2023-11-24: qty 30, 3d supply, fill #0

## 2023-11-24 MED ORDER — NIFEDIPINE ER OSMOTIC RELEASE 90 MG PO TB24
90.0000 mg | ORAL_TABLET | Freq: Every day | ORAL | 3 refills | Status: DC
  Filled 2023-11-24: qty 30, 30d supply, fill #0

## 2023-11-24 MED ORDER — MEDROXYPROGESTERONE ACETATE 150 MG/ML IM SUSP
150.0000 mg | Freq: Once | INTRAMUSCULAR | Status: AC
  Administered 2023-11-24: 150 mg via INTRAMUSCULAR
  Filled 2023-11-24: qty 1

## 2023-11-24 NOTE — Discharge Summary (Signed)
 Pt dc'd to home. Dc instructions reviewed again. Pt verbalizes understanding of all dc instruction.

## 2023-11-24 NOTE — Plan of Care (Signed)
 Discharge instructions, when to follow up, and prescriptions reviewed with patient- meds at bed from pharmacy.  Patient verbalized understanding. Patient is staying to visit with infant in [facility] until 10pm (when her ride arrives) Elyn Sharps, RN 11/24/23 @1622 

## 2023-11-24 NOTE — Discharge Summary (Signed)
 Postpartum Discharge Summary  Date of Service updated 11/24/2023     Patient Name: Lori Gross DOB: 08-11-1995 MRN: [id number]  Date of admission: 11/19/2023 Delivery date:11/21/2023 Delivering provider: JANIT ALM AGENT Date of discharge: 11/24/2023  Admitting diagnosis: Labor and delivery, indication for care [O75.9] Intrauterine pregnancy: [redacted]w[redacted]d     Secondary diagnosis:  Active Problems:   Labor and delivery, indication for care Primary cesarean section  Preterm  Chronic hypertension   Additional problems: Severe IUGR    Discharge diagnosis: Preterm Pregnancy Delivered and CHTN                                              Post partum procedures:rhogam Augmentation: N/A Complications: None  Hospital course: Sceduled C/S   28 y.o. yo G3P2103 at [redacted]w[redacted]d was admitted to the hospital 11/19/2023 for scheduled cesarean section with the following indication:Preterm severe IUGR, non reassuring fetal heart tracing.Delivery details are as follows:  Membrane Rupture Time/Date: 8:28 AM,11/21/2023  Delivery Method:C-Section, Low Transverse Operative Delivery:N/A Details of operation can be found in separate operative note.  Patient had a postpartum course complicated by [facility].  She is ambulating, tolerating a regular diet, passing flatus, and urinating well. Patient is discharged home in stable condition on  11/24/23        Newborn Data: Birth date:11/21/2023 Birth time:8:29 AM Gender:Female Living status:Living Apgars:8 ,9  Weight:1530 g    Magnesium Sulfate received: No BMZ received: Yes Rhophylac :Yes MMR:No T-DaP:Given prenatally Flu: N/A RSV Vaccine received: No Transfusion:No Immunizations administered: Immunization History  Administered Date(s) Administered   DTaP 03/26/1994, 05/28/1994, 07/27/1994, 04/27/1995, 11/10/1999   HIB (PRP-OMP) 03/26/1994, 05/28/1994, 07/27/1994, 04/27/1995   HPV Quadrivalent 01/22/2012, 06/28/2012, 12/27/2012   Hepatitis A 01/22/2012,  12/27/2012   Hepatitis B April 25, 1993, 03/26/1994, 07/27/1994   IPV 03/26/1994, 05/28/1994, 07/27/1994, 11/10/1999   Influenza,inj,Quad PF,6+ Mos 04/23/2015, 07/10/2016, 07/15/2018, 01/16/2019, 04/22/2020   Influenza-Unspecified 06/07/2014   MMR 04/27/1995, 11/10/1999, 11/10/2015   Tdap 01/22/2012, 08/27/2015, 12/22/2016, 10/13/2023   Varicella 02/09/1995, 01/22/2012, 11/10/2015    Physical exam  Vitals:   11/23/23 1920 11/23/23 2236 11/24/23 0325 11/24/23 0753  BP: (!) 153/96 137/83 132/76 (!) 134/94  Pulse: 94 86 85 87  Resp: 19 18 18 18   Temp: 98.4 F (36.9 C) 98.6 F (37 C) 98 F (36.7 C) 98 F (36.7 C)  TempSrc: Oral Oral Oral Oral  SpO2: 100% 99% 98% 97%  Weight:      Height:       General: alert, cooperative, and no distress Lochia: appropriate Uterine Fundus: firm Incision: Healing well with no significant drainage, No significant erythema, Dressing is clean, dry, and intact DVT Evaluation: No evidence of DVT seen on physical exam. No cords or calf tenderness. No significant calf/ankle edema. Labs: Lab Results  Component Value Date   WBC 13.4 (H) 11/22/2023   HGB 9.7 (L) 11/22/2023   HCT 27.7 (L) 11/22/2023   MCV 106.1 (H) 11/22/2023   PLT 170 11/22/2023      Latest Ref Rng & Units 11/19/2023    5:52 PM  CMP  Glucose 70 - 99 mg/dL 63   BUN 6 - 20 mg/dL 9   Creatinine 9.55 - 8.99 mg/dL 9.28   Sodium 864 - 854 mmol/L 138   Potassium 3.5 - 5.1 mmol/L 4.0   Chloride 98 - 111 mmol/L 106   CO2 22 -  32 mmol/L 22   Calcium  8.9 - 10.3 mg/dL 9.1   Total Protein 6.5 - 8.1 g/dL 6.6   Total Bilirubin 0.0 - 1.2 mg/dL 0.4   Alkaline Phos 38 - 126 U/L 116   AST 15 - 41 U/L 16   ALT 0 - 44 U/L 10    Edinburgh Score:    11/21/2023    7:45 PM  Edinburgh Postnatal Depression Scale Screening Tool  I have been able to laugh and see the funny side of things. 0  I have looked forward with enjoyment to things. 0  I have blamed myself unnecessarily when things went wrong. 1   I have been anxious or worried for no good reason. 2  I have felt scared or panicky for no good reason. 1  Things have been getting on top of me. 1  I have been so unhappy that I have had difficulty sleeping. 1  I have felt sad or miserable. 1  I have been so unhappy that I have been crying. 1  The thought of harming myself has occurred to me. 0  Edinburgh Postnatal Depression Scale Total 8      After visit meds:  Allergies as of 11/24/2023   No Known Allergies      Medication List     STOP taking these medications    amoxicillin  500 MG capsule Commonly known as: AMOXIL        TAKE these medications    ACCRUFeR  30 MG Caps Generic drug: Ferric Maltol  Take 1 capsule (30 mg total) by mouth daily.   labetalol  100 MG tablet Commonly known as: NORMODYNE  Take 3 tablets (300 mg total) by mouth 2 (two) times daily.   NIFEdipine  90 MG 24 hr tablet Commonly known as: PROCARDIA  XL/NIFEDICAL-XL Take 1 tablet (90 mg total) by mouth daily. What changed:  medication strength how much to take   oxyCODONE -acetaminophen  5-325 MG tablet Commonly known as: PERCOCET/ROXICET Take 1-2 tablets by mouth every 4 (four) hours as needed for moderate pain (pain score 4-6).   phenazopyridine  100 MG tablet Commonly known as: PYRIDIUM  Take 1 tablet (100 mg total) by mouth 3 (three) times daily with meals.   PRENATAL GUMMIES PO Take by mouth.         Discharge home in stable condition Infant Feeding: Bottle and Breast Infant Disposition:NICU Discharge instruction: per After Visit Summary and Postpartum booklet. Activity: Advance as tolerated. Pelvic rest for 6 weeks.  Diet: routine diet Anticipated Birth Control: Depo, given prior to discharge Postpartum Appointment:2-3 days Additional Postpartum F/U: BP check 2-3 days Future Appointments: Future Appointments  Date Time Provider Department Center  12/02/2023 10:35 AM Justino Eleanor HERO, CNM [facility] None   Follow up Visit:   Follow-up Information     Janit Alm Agent, MD. Schedule an appointment as soon as possible for a visit in 1 week(s).   Specialties: Obstetrics and Gynecology, Radiology Why: Incision check, BP check Contact information: [address] [place] [address] [phone]         [facility] Follow up in 6 week(s).   Specialty: Obstetrics and Gynecology Why: Postpartum visit Contact information: [address] [place]  [address] [phone]                    11/24/2023 Zelda Hummer, CNM
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
## 2023-11-25 NOTE — Progress Notes (Signed)
    NURSE VISIT NOTE  Subjective:    Patient ID: GEAN LAURSEN, female    DOB: Dec 16, 1995, 28 y.o.   MRN: [id number]  HPI  Patient is a 28 y.o. [phone] female who presents for BP check per order from Zelda Hummer, CNM.   Patient reports compliance with prescribed BP medications: {YES/NO/WILD CARDS:18581} Labetalol  300mg  BID and Procardia  90mg  daily Last dose of BP medication: {Elopement time:3044023}  BP Readings from Last 3 Encounters:  11/19/23 135/88  11/16/23 (!) 151/97  11/10/23 138/86   Pulse Readings from Last 3 Encounters:  11/19/23 (!) 101  11/16/23 73  11/10/23 84    Objective:    There were no vitals taken for this visit.  Assessment:   No diagnosis found.   Plan:   Per Dr. GWENETH Providers:28529}:  {AEEOJW:71368:e}  Patient verbalized understanding of instructions.   [name], CMA

## 2023-11-26 ENCOUNTER — Encounter: Admitting: Advanced Practice Midwife

## 2023-11-26 ENCOUNTER — Ambulatory Visit

## 2023-11-26 ENCOUNTER — Other Ambulatory Visit

## 2023-11-26 VITALS — BP 118/83 | HR 103 | Ht 64.0 in | Wt 206.9 lb

## 2023-11-26 DIAGNOSIS — Z0130 Encounter for examination of blood pressure without abnormal findings: Secondary | ICD-10-CM

## 2023-11-26 DIAGNOSIS — I10 Essential (primary) hypertension: Secondary | ICD-10-CM

## 2023-11-26 NOTE — Patient Instructions (Signed)
 Managing Your Hypertension Hypertension, also called high blood pressure, is when the force of the blood pressing against the walls of the arteries is too strong. Arteries are blood vessels that carry blood from your heart throughout your body. Hypertension forces the heart to work harder to pump blood and may cause the arteries to become narrow or stiff. Understanding blood pressure readings A blood pressure reading includes a higher number over a lower number: The first, or top, number is called the systolic pressure. It is a measure of the pressure in your arteries as your heart beats. The second, or bottom number, is called the diastolic pressure. It is a measure of the pressure in your arteries as the heart relaxes. For most people, a normal blood pressure is below 120/80. Your personal target blood pressure may vary depending on your medical conditions, your age, and other factors. Blood pressure is classified into four stages. Based on your blood pressure reading, your health care provider may use the following stages to determine what type of treatment you need, if any. Systolic pressure and diastolic pressure are measured in a unit called millimeters of mercury (mmHg). Normal Systolic pressure: below 120. Diastolic pressure: below 80. Elevated Systolic pressure: 120-129. Diastolic pressure: below 80. Hypertension stage 1 Systolic pressure: 130-139. Diastolic pressure: 80-89. Hypertension stage 2 Systolic pressure: 140 or above. Diastolic pressure: 90 or above. How can this condition affect me? Managing your hypertension is very important. Over time, hypertension can damage the arteries and decrease blood flow to parts of the body, including the brain, heart, and kidneys. Having untreated or uncontrolled hypertension can lead to: A heart attack. A stroke. A weakened blood vessel (aneurysm). Heart failure. Kidney damage. Eye damage. Memory and concentration problems. Vascular  dementia. What actions can I take to manage this condition? Hypertension can be managed by making lifestyle changes and possibly by taking medicines. Your health care provider will help you make a plan to bring your blood pressure within a normal range. You may be referred for counseling on a healthy diet and physical activity. Nutrition  Eat a diet that is high in fiber and potassium, and low in salt (sodium), added sugar, and fat. An example eating plan is called the DASH diet. DASH stands for Dietary Approaches to Stop Hypertension. To eat this way: Eat plenty of fresh fruits and vegetables. Try to fill one-half of your plate at each meal with fruits and vegetables. Eat whole grains, such as whole-wheat pasta, brown rice, or whole-grain bread. Fill about one-fourth of your plate with whole grains. Eat low-fat dairy products. Avoid fatty cuts of meat, processed or cured meats, and poultry with skin. Fill about one-fourth of your plate with lean proteins such as fish, chicken without skin, beans, eggs, and tofu. Avoid pre-made and processed foods. These tend to be higher in sodium, added sugar, and fat. Reduce your daily sodium intake. Many people with hypertension should eat less than 1,500 mg of sodium a day. Lifestyle  Work with your health care provider to maintain a healthy body weight or to lose weight. Ask what an ideal weight is for you. Get at least 30 minutes of exercise that causes your heart to beat faster (aerobic exercise) most days of the week. Activities may include walking, swimming, or biking. Include exercise to strengthen your muscles (resistance exercise), such as weight lifting, as part of your weekly exercise routine. Try to do these types of exercises for 30 minutes at least 3 days a week. Do  not use any products that contain nicotine or tobacco. These products include cigarettes, chewing tobacco, and vaping devices, such as e-cigarettes. If you need help quitting, ask your  health care provider. Control any long-term (chronic) conditions you have, such as high cholesterol or diabetes. Identify your sources of stress and find ways to manage stress. This may include meditation, deep breathing, or making time for fun activities. Alcohol use Do not drink alcohol if: Your health care provider tells you not to drink. You are pregnant, may be pregnant, or are planning to become pregnant. If you drink alcohol: Limit how much you have to: 0-1 drink a day for women. 0-2 drinks a day for men. Know how much alcohol is in your drink. In the [place] one drink equals one 12 oz bottle of beer (355 mL), one 5 oz glass of wine (148 mL), or one 1 oz glass of hard liquor (44 mL). Medicines Your health care provider may prescribe medicine if lifestyle changes are not enough to get your blood pressure under control and if: Your systolic blood pressure is 130 or higher. Your diastolic blood pressure is 80 or higher. Take medicines only as told by your health care provider. Follow the directions carefully. Blood pressure medicines must be taken as told by your health care provider. The medicine does not work as well when you skip doses. Skipping doses also puts you at risk for problems. Monitoring Before you monitor your blood pressure: Do not smoke, drink caffeinated beverages, or exercise within 30 minutes before taking a measurement. Use the bathroom and empty your bladder (urinate). Sit quietly for at least 5 minutes before taking measurements. Monitor your blood pressure at home as told by your health care provider. To do this: Sit with your back straight and supported. Place your feet flat on the floor. Do not cross your legs. Support your arm on a flat surface, such as a table. Make sure your upper arm is at heart level. Each time you measure, take two or three readings one minute apart and record the results. You may also need to have your blood pressure checked regularly by  your health care provider. General information Talk with your health care provider about your diet, exercise habits, and other lifestyle factors that may be contributing to hypertension. Review all the medicines you take with your health care provider because there may be side effects or interactions. Keep all follow-up visits. Your health care provider can help you create and adjust your plan for managing your high blood pressure. Where to find more information National Heart, Lung, and Blood Institute: [employer]Ste[employer].is American Heart Association: www.heart.org Contact a health care provider if: You think you are having a reaction to medicines you have taken. You have repeated (recurrent) headaches. You feel dizzy. You have swelling in your ankles. You have trouble with your vision. Get help right away if: You develop a severe headache or confusion. You have unusual weakness or numbness, or you feel faint. You have severe pain in your chest or abdomen. You vomit repeatedly. You have trouble breathing. These symptoms may be an emergency. Get help right away. Call 911. Do not wait to see if the symptoms will go away. Do not drive yourself to the hospital. Summary Hypertension is when the force of blood pumping through your arteries is too strong. If this condition is not controlled, it may put you at risk for serious complications. Your personal target blood pressure may vary depending on your medical conditions,  your age, and other factors. For most people, a normal blood pressure is less than 120/80. Hypertension is managed by lifestyle changes, medicines, or both. Lifestyle changes to help manage hypertension include losing weight, eating a healthy, low-sodium diet, exercising more, stopping smoking, and limiting alcohol. This information is not intended to replace advice given to you by your health care provider. Make sure you discuss any questions you have with your health care  provider. Document Revised: 12/19/2020 Document Reviewed: 12/19/2020 Elsevier Patient Education  2024 [employer].

## 2023-11-29 NOTE — Telephone Encounter (Signed)
 Pt came in for bp check on 11/26/23.  Pt is scheduled for 1 week incision check on 12/02/2023 with Missy Swanson.  Will need to schedule 6 week pp visit.

## 2023-11-30 ENCOUNTER — Other Ambulatory Visit

## 2023-11-30 ENCOUNTER — Ambulatory Visit

## 2023-11-30 DIAGNOSIS — Z419 Encounter for procedure for purposes other than remedying health state, unspecified: Secondary | ICD-10-CM | POA: Diagnosis not present

## 2023-12-02 ENCOUNTER — Encounter: Payer: Self-pay | Admitting: Obstetrics

## 2023-12-02 ENCOUNTER — Ambulatory Visit (INDEPENDENT_AMBULATORY_CARE_PROVIDER_SITE_OTHER): Admitting: Obstetrics

## 2023-12-02 NOTE — Progress Notes (Signed)
   Incision Check   Subjective:    Subjective  Lori Gross is a 28 y.o. female who presents for a postpartum incision check. She is 11 days  postpartum following a LTCS. I have reviewed the prenatal and intrapartum course. The delivery was at [redacted]w[redacted]d .  Anesthesia: spinal.    Postpartum course has been normal. She has no pain and no other concerns. Baby is in the SCN.  Bleeding is light. Bowel function is normal. Bladder function is normal.  .  Contraception method: Depo. Postpartum depression screening:  negative     Review of Systems Pertinent positives noted in HPI. Remainder of comprehensive ROS otherwise negative.     Objective:     BP 123/83   Pulse 80   Ht 5' 4 (1.626 m)   Wt 202 lb (91.6 kg)   Breastfeeding No   BMI 34.67 kg/m   General:  alert, cooperative, and no distress    Incision: Well-approximated, so s/s of infection, no drainage or erythema. Honeycomb dressing removed. Steristrips intact.    Assessment:    Normal postpartum course, mood stable. Incision healing well.   Plan:      Plan -Anticipatory guidance about the postpartum period -Reviewed s/s of PPD and PPA -Discussed when to seek medical care -Incision care reviewed. Remove remaining steristrips in the next few days. -Office visit at 6 weeks postpartum  -Continue nifedipine  and labetalol  at current doses I discussed the assessment and treatment plan with the patient. The patient was provided an opportunity to ask questions and all were answered. The patient agreed with the plan and demonstrated an understanding of the instructions.   The patient was advised to call back or seek an in-person evaluation if the symptoms worsen or if the condition fails to improve as anticipated.       Miasha Emmons M Joziyah Roblero, CNM

## 2023-12-07 ENCOUNTER — Ambulatory Visit

## 2023-12-09 ENCOUNTER — Encounter: Payer: Self-pay | Admitting: Obstetrics and Gynecology

## 2023-12-16 DIAGNOSIS — R69 Illness, unspecified: Secondary | ICD-10-CM | POA: Diagnosis not present

## 2023-12-20 DIAGNOSIS — R69 Illness, unspecified: Secondary | ICD-10-CM | POA: Diagnosis not present

## 2023-12-31 DIAGNOSIS — Z419 Encounter for procedure for purposes other than remedying health state, unspecified: Secondary | ICD-10-CM | POA: Diagnosis not present

## 2024-01-03 ENCOUNTER — Encounter: Payer: Self-pay | Admitting: Obstetrics and Gynecology

## 2024-01-05 ENCOUNTER — Encounter: Payer: Self-pay | Admitting: Obstetrics

## 2024-01-05 ENCOUNTER — Ambulatory Visit (INDEPENDENT_AMBULATORY_CARE_PROVIDER_SITE_OTHER): Admitting: Obstetrics

## 2024-01-05 ENCOUNTER — Ambulatory Visit: Admitting: Obstetrics and Gynecology

## 2024-01-05 DIAGNOSIS — I10 Essential (primary) hypertension: Secondary | ICD-10-CM

## 2024-01-05 DIAGNOSIS — R87612 Low grade squamous intraepithelial lesion on cytologic smear of cervix (LGSIL): Secondary | ICD-10-CM

## 2024-01-05 DIAGNOSIS — R011 Cardiac murmur, unspecified: Secondary | ICD-10-CM

## 2024-01-05 DIAGNOSIS — R87619 Unspecified abnormal cytological findings in specimens from cervix uteri: Secondary | ICD-10-CM | POA: Insufficient documentation

## 2024-01-05 NOTE — Progress Notes (Signed)
 SUBJECTIVE  Lori Gross is a 28 y.o. [phone] who gave birth to a female infant at 33+ weeks via C/S on 11/21/23 for NRFHT, FGR.  She is bottle feeding formula. She is here today for a PP follow-up visit.  She has f/u scheduled with her PCP for blood pressure management ([facility]TN).  ROS 12 point systems check negative.  Mood  EPDS 4 GAD7 0  Bowel function: Normal Bladder function: Normal Vaginal bleeding: scant spotting Pain: None  Denies difficulty breathing, chest pain, lower extremity pain or swelling, excessive vaginal bleeding, vaginal pain.   Infant: Baby boy Lori Gross  OBJECTIVE Last Weight  Most recent update: 01/05/2024 10:21 AM    Weight  93 kg (205 lb)            Body mass index is 35.19 kg/m.  BP 114/73   Pulse 91   Wt 205 lb (93 kg)   Breastfeeding No   BMI 35.19 kg/m  Patient has no known allergies.  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 EFW: 1616g, 3lb 9oz, AFI 11.18   -Normal dopplers  -Referal to MFM made      Grieving    Hyperlipidemia due to dietary fat intake    Hypertension    Marijuana use 04/20/2014   positive UDS   Microalbuminuria    Migraine without aura and responsive to treatment 10/08/2014   Migraine without aura and without status migrainosus, not intractable    Morbid obesity (HCC) 09/04/2021   Morbid obesity due to excess calories (HCC)    BMI 37-38 with HTN & HLD   Obesity    Obesity (BMI 30.0-34.9) 10/08/2014   Patient to limit weight gain in pregnancy to 15 pounds     Palpitations 11/14/2021   Plantar fasciitis, left 11/14/2021    Rh negative state in antepartum period 08/27/2015   Tachycardia 07/15/2018   Urticaria    Vitamin D  deficiency 11/14/2021   Wrist pain, left 05/20/2021   Past Surgical History:  Procedure Laterality Date   CESAREAN SECTION N/A 11/21/2023   Procedure: CESAREAN DELIVERY;  Surgeon: Janit Alm Agent, MD;  Location: [facility] ORS;  Service: Obstetrics;  Laterality: N/A;   NO PAST SURGERIES     TRANSTHORACIC ECHOCARDIOGRAM  07/30/2021   HR 100 bpm. Normal LV size & fxn. EF 55-60%. No RWMA. Gr 1 DD? But normal LA size. Normal Valves. Normal RV, RVSP & RAP.    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 Due for f/u pap with cotesting in Dec this year (3 mos)  BP 114/73   Pulse 91   Wt 205 lb (93 kg)   Breastfeeding No   BMI 35.19 kg/m  General appearance: alert, cooperative, and no distress Throat: lips, mucosa, and tongue normal; teeth and gums normal Neck: no adenopathy, no carotid bruit, no JVD, supple, symmetrical, trachea midline, and thyroid  not enlarged, symmetric, no tenderness/mass/nodules Back: symmetric, no curvature. ROM normal. No CVA tenderness. Lungs: clear to auscultation bilaterally Breasts: normal appearance, no masses or tenderness Heart: RRR, soft SEM.  Abdomen: soft, non-tender; bowel  sounds normal; no masses,  no organomegaly Incision: Well healed Skin: Skin color, texture, turgor normal. No rashes or lesions  ASSESSMENT CHTN- managed by PCP Abnormal pap 03/2023 Grossly normal PE  PLAN f/u with PCP re: CHTN meds, transitioning back to single agent.  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 Will have repap with cotesting in December per ASCCP guidelines. Scheduled today.  The pregnancy intention screening data noted above was reviewed. Potential methods of contraception were discussed. The patient elected to proceed with depo. She received her first dose prior to discharge home from the hospital (around  8/5). She will be due for her next dose in 2 mos. That was scheduled today.   Discussed PP mood changes and coping strategies. Reviewed preventive care (dental, eye exam, vaccines, routine screenings).  Lori Gross, CNM

## 2024-01-06 ENCOUNTER — Ambulatory Visit: Admitting: Family Medicine

## 2024-02-14 ENCOUNTER — Telehealth: Payer: Self-pay

## 2024-02-14 NOTE — Telephone Encounter (Signed)
 Called pt no answer left detailed vm.

## 2024-02-14 NOTE — Telephone Encounter (Signed)
 Copied from CRM #[id number]. Topic: Appointments - Appointment Scheduling >> Feb 14, 2024  3:16 PM Emylou G wrote: Pls call patient.. interested in returning back to original BP meds.. If you need to see her - can she be seen by another PCP.. I did add her to waitlist.

## 2024-02-14 NOTE — Telephone Encounter (Unsigned)
 Copied from CRM #[id number]. Topic: Appointments - Appointment Scheduling >> Feb 14, 2024  3:16 PM Emylou G wrote: Pls call patient.. interested in returning back to original BP meds.. If you need to see her - can she be seen by another PCP.. I did add her to waitlist. >> Feb 14, 2024  3:53 PM Emylou G wrote: Patient returned call at work.. okay to leave msg on my chart if you need to

## 2024-02-14 NOTE — Telephone Encounter (Signed)
 Pt is scheduled out to Dec, if patient is having bp issues can use same day slot for something sooner. Please schedule sooner if needed

## 2024-02-16 NOTE — Telephone Encounter (Signed)
 Appt sch'd for 11/5

## 2024-02-23 ENCOUNTER — Ambulatory Visit (INDEPENDENT_AMBULATORY_CARE_PROVIDER_SITE_OTHER): Admitting: Family Medicine

## 2024-02-23 ENCOUNTER — Ambulatory Visit (INDEPENDENT_AMBULATORY_CARE_PROVIDER_SITE_OTHER)

## 2024-02-23 ENCOUNTER — Encounter: Payer: Self-pay | Admitting: Family Medicine

## 2024-02-23 VITALS — BP 149/94 | HR 89 | Ht 65.0 in | Wt 214.0 lb

## 2024-02-23 DIAGNOSIS — Z3202 Encounter for pregnancy test, result negative: Secondary | ICD-10-CM

## 2024-02-23 DIAGNOSIS — E785 Hyperlipidemia, unspecified: Secondary | ICD-10-CM | POA: Diagnosis not present

## 2024-02-23 DIAGNOSIS — Z3042 Encounter for surveillance of injectable contraceptive: Secondary | ICD-10-CM

## 2024-02-23 DIAGNOSIS — R002 Palpitations: Secondary | ICD-10-CM

## 2024-02-23 DIAGNOSIS — I10 Essential (primary) hypertension: Secondary | ICD-10-CM

## 2024-02-23 DIAGNOSIS — G43009 Migraine without aura, not intractable, without status migrainosus: Secondary | ICD-10-CM | POA: Diagnosis not present

## 2024-02-23 DIAGNOSIS — R300 Dysuria: Secondary | ICD-10-CM

## 2024-02-23 DIAGNOSIS — R7303 Prediabetes: Secondary | ICD-10-CM

## 2024-02-23 DIAGNOSIS — E559 Vitamin D deficiency, unspecified: Secondary | ICD-10-CM | POA: Diagnosis not present

## 2024-02-23 DIAGNOSIS — E538 Deficiency of other specified B group vitamins: Secondary | ICD-10-CM | POA: Diagnosis not present

## 2024-02-23 LAB — POCT URINE PREGNANCY: Preg Test, Ur: NEGATIVE

## 2024-02-23 MED ORDER — NITROFURANTOIN MONOHYD MACRO 100 MG PO CAPS: 100.0000 mg | ORAL_CAPSULE | Freq: Two times a day (BID) | ORAL | 0 refills | Status: DC

## 2024-02-23 MED ORDER — METFORMIN HCL ER 500 MG PO TB24: 500.0000 mg | ORAL_TABLET | Freq: Every day | ORAL | 0 refills | Status: DC

## 2024-02-23 MED ORDER — METOPROLOL SUCCINATE ER 25 MG PO TB24: 25.0000 mg | ORAL_TABLET | Freq: Every day | ORAL | 0 refills | Status: DC

## 2024-02-23 MED ORDER — MEDROXYPROGESTERONE ACETATE 150 MG/ML IM SUSY
150.0000 mg | PREFILLED_SYRINGE | Freq: Once | INTRAMUSCULAR | Status: AC
  Administered 2024-02-23: 150 mg via INTRAMUSCULAR

## 2024-02-23 MED ORDER — SPIRONOLACTONE-HCTZ 25-25 MG PO TABS: 1.0000 | ORAL_TABLET | Freq: Every day | ORAL | 0 refills | Status: AC

## 2024-02-23 NOTE — Progress Notes (Signed)
 Name: Lori Gross   MRN: [id number]    DOB: December 31, 1995   Date:02/23/2024       Progress Note  Subjective  Chief Complaint  Chief Complaint  Patient presents with   Medical Management of Chronic Issues   Dysuria    Yesterday, unable to obtain dipstick due to pt consumed OTC med made urine bright orange/red   Discussed the use of AI scribe software for clinical note transcription with the patient, who gave verbal consent to proceed.  History of Present Illness Lori Gross is a 28 year old female with chronic hypertension who presents for postpartum follow-up and medication management.  She is here for postpartum follow-up after the birth of her third child in August. The delivery was an emergency C-section due to fetal growth restriction and a knot in the umbilical cord. The baby was born at 33 weeks and 5 days, weighing 3 pounds 6 ounces, and spent a month in the NICU. She is now 9 pounds and developing well.  She has a history of chronic hypertension, diagnosed at age 54, and was managed with labetalol  during her recent pregnancy. Currently, she is taking labetalol  300 mg twice daily.  She previously used spironolactone , HCTZ, and metoprolol  for hypertension management. No current chest pain or palpitations.  She has a BMI of 35 and is concerned about weight gain, particularly after starting Depo-Provera  for contraception. She is not currently nursing.  She has a history of B12 deficiency but is not taking supplements currently. She was taking prenatal vitamins during pregnancy but has stopped postpartum.  She reports frequent UTIs during pregnancy and is currently experiencing burning with urination since yesterday. She has taken pyridium  to control symptoms but does not have antibiotics at home  She has a history of tachycardia but is doing okay at this time  She has a history of migraines but is not currently taking medication for them and reports no recent  episodes.  She is not currently taking medication for prediabetes and is attempting dietary modifications to manage her condition.    Patient Active Problem List   Diagnosis Date Noted   Abnormal Pap smear of cervix 01/05/2024   Systolic murmur 07/03/2021   Hypertension, benign 11/20/2015    Past Surgical History:  Procedure Laterality Date   CESAREAN SECTION N/A 11/21/2023   Procedure: CESAREAN DELIVERY;  Surgeon: Janit Alm Agent, MD;  Location: [facility] ORS;  Service: Obstetrics;  Laterality: N/A;   NO PAST SURGERIES     TRANSTHORACIC ECHOCARDIOGRAM  07/30/2021   HR 100 bpm. Normal LV size & fxn. EF 55-60%. No RWMA. Gr 1 DD? But normal LA size. Normal Valves. Normal RV, RVSP & RAP.    Family History  Problem Relation Age of Onset   Hypertension Mother    Cancer Mother 98       Common Bile Duct   Hypertension Sister    Hypertension Brother    Diabetes Paternal Grandmother    Cancer Paternal Grandmother 51       brain cancer   Diabetes Paternal Grandfather    Hypertension Father    Cancer Father 85       kidney cancer    Social History   Tobacco Use   Smoking status: Never   Smokeless tobacco: Never  Substance Use Topics   Alcohol use: No    Alcohol/week: 0.0 standard drinks of alcohol     Current Outpatient Medications:    labetalol  (NORMODYNE ) 100 MG tablet, Take 3  tablets (300 mg total) by mouth 2 (two) times daily. (Patient taking differently: Take 300 mg by mouth once.), Disp: 180 tablet, Rfl: 6   NIFEdipine  (PROCARDIA  XL/NIFEDICAL-XL) 90 MG 24 hr tablet, Take 1 tablet (90 mg total) by mouth daily., Disp: 30 tablet, Rfl: 3   Ferric Maltol  (ACCRUFER ) 30 MG CAPS, Take 1 capsule (30 mg total) by mouth daily. (Patient not taking: Reported on 02/23/2024), Disp: 30 capsule, Rfl: 6   phenazopyridine  (PYRIDIUM ) 100 MG tablet, Take 1 tablet (100 mg total) by mouth 3 (three) times daily with meals. (Patient not taking: Reported on 02/23/2024), Disp: 10 tablet, Rfl: 0  No  Known Allergies  I personally reviewed active problem list, medication list, allergies with the patient/caregiver today.   ROS  Ten systems reviewed and is negative except as mentioned in HPI    Objective Physical Exam  CONSTITUTIONAL: Patient appears well-developed and well-nourished.  No distress. HEENT: Head atraumatic, normocephalic, neck supple. CARDIOVASCULAR: Normal rate, regular rhythm and normal heart sounds.  No murmur heard. No BLE edema. PULMONARY: Effort normal and breath sounds normal. No respiratory distress. ABDOMINAL: There is no tenderness or distention. MUSCULOSKELETAL: Normal gait. Without gross motor or sensory deficit. PSYCHIATRIC: Patient has a normal mood and affect. behavior is normal. Judgment and thought content normal.  Vitals:   02/23/24 1410  BP: 128/82  Pulse: 95  Resp: 16  SpO2: 99%  Weight: 213 lb 4.8 oz (96.8 kg)  Height: 5' 5 (1.651 m)    Body mass index is 35.49 kg/m.  Recent Results (from the past 2160 hours)  POCT urine pregnancy     Status: Normal   Collection Time: 02/23/24 11:51 AM  Result Value Ref Range   Preg Test, Ur Negative Negative      PHQ2/9:    02/23/2024    2:05 PM 01/05/2024   10:50 AM 06/23/2023    1:03 PM 05/17/2023   11:23 AM 04/28/2023   11:22 AM  Depression screen PHQ 2/9  Decreased Interest 0 0 0 0 0  Down, Depressed, Hopeless 0 0 0 0 0  PHQ - 2 Score 0 0 0 0 0  Altered sleeping  0 0 0 0  Tired, decreased energy  0 0 1 0  Change in appetite  0 0 0 0  Feeling bad or failure about yourself   0 0 0 0  Trouble concentrating  0 0 0 0  Moving slowly or fidgety/restless  0 0 0 0  Suicidal thoughts  0 0 0 0  PHQ-9 Score  0 0 1 0  Difficult doing work/chores    Not difficult at all Not difficult at all    phq 9 is negative  Fall Risk:    02/23/2024    2:05 PM 04/28/2023   11:15 AM 12/16/2022   11:50 AM 05/15/2022    2:36 PM 11/14/2021    2:06 PM  Fall Risk   Falls in the past year? 0 0 0 0 0  Number  falls in past yr: 0 0   0  Injury with Fall? 0 0   0  Risk for fall due to : No Fall Risks No Fall Risks No Fall Risks No Fall Risks No Fall Risks  Follow up Falls evaluation completed Falls prevention discussed;Education provided;Falls evaluation completed Falls prevention discussed Falls prevention discussed Falls prevention discussed;Education provided      Data saved with a previous flowsheet row definition     Assessment & Plan Essential hypertension Blood  pressure management requires adjustment post-pregnancy. Discussed potential switch back to previous regimen due to weight gain concerns with Depo-Provera . - Restarted spironolactone  and HCTZ 25 mg each. - Prescribed metoprolol  ER  25 mg for palpitations. - Scheduled follow-up in two weeks for blood pressure check. - Advised home blood pressure monitoring and use of remaining labetalol  as needed.  Morbid obesity with hyperlipidemia and prediabetes BMI over 35 with hyperlipidemia and prediabetes. Discussed dietary modifications and metformin benefits for weight loss and insulin resistance. - Prescribed metformin for prediabetes and potential weight loss. - Ordered lipid panel and A1c to monitor hyperlipidemia and prediabetes. - Advised dietary modifications focusing on reducing fast food, fried food, and increasing fish, vegetables, and fruits. - Encouraged physical activity, such as pushing the stroller.  Suspected urinary tract infection presenting as dysuria Recent onset of dysuria. Frequent UTIs during pregnancy. Macrobid  chosen for treatment. - Prescribed Macrobid  twice a day for five days. - Sent urine for culture to confirm infection.  Vitamin D  and B12 deficiency Deficiency noted. Not currently taking supplements. - Ordered blood tests to check vitamin D , B12, and folate levels.  Migraine without aura No current medication for migraines. No recent episodes reported. Discussed option of having medication on hand for  future episodes, but she declined. - Continue to monitor for migraine episodes.

## 2024-02-23 NOTE — Progress Notes (Signed)
    NURSE VISIT NOTE  Subjective:    Patient ID: Lori Gross, female    DOB: 1995-07-29, 28 y.o.   MRN: [id number]  HPI  Patient is a 28 y.o. G9P2103 female who presents for depo provera  injection.   Objective:    BP (!) 149/94   Pulse 89   Ht 5' 5 (1.651 m)   Wt 214 lb (97.1 kg)   BMI 35.61 kg/m   Last Annual: post part. Last pap: 03/10/23. Last Depo-Provera : 11/23/23. Side Effects if any: none. Serum HCG indicated? No . Depo-Provera  150 mg IM given by: Annalee Sanders, CMA. Site: Right Deltoid  Lab Review  No results found for any visits on 02/23/24.  Assessment:   1. Encounter for Depo-Provera  contraception      Plan:  Blood pressure was elevated in office she has a pcp apt at 1 today she was encouraged to keep that apt.  Next appointment due between Jan 22-Feb-5     Annalee VEAR Sanders, CMA

## 2024-02-24 ENCOUNTER — Ambulatory Visit: Payer: Self-pay | Admitting: Family Medicine

## 2024-02-24 LAB — COMPREHENSIVE METABOLIC PANEL WITH GFR
AG Ratio: 1.8 (calc) (ref 1.0–2.5)
ALT: 16 U/L (ref 6–29)
AST: 13 U/L (ref 10–30)
Albumin: 4.2 g/dL (ref 3.6–5.1)
Alkaline phosphatase (APISO): 74 U/L (ref 31–125)
BUN: 10 mg/dL (ref 7–25)
CO2: 28 mmol/L (ref 20–32)
Calcium: 9 mg/dL (ref 8.6–10.2)
Chloride: 105 mmol/L (ref 98–110)
Creat: 0.79 mg/dL (ref 0.50–0.97)
Globulin: 2.3 g/dL (ref 1.9–3.7)
Glucose, Bld: 87 mg/dL (ref 65–99)
Potassium: 3.6 mmol/L (ref 3.5–5.3)
Sodium: 140 mmol/L (ref 135–146)
Total Bilirubin: 0.5 mg/dL (ref 0.2–1.2)
Total Protein: 6.5 g/dL (ref 6.1–8.1)
eGFR: 103 mL/min/1.73m2 (ref >=60)

## 2024-02-24 LAB — LIPID PANEL
Cholesterol: 208 mg/dL — ABNORMAL HIGH (ref <200)
HDL: 44 mg/dL — ABNORMAL LOW (ref >=50)
LDL Cholesterol (Calc): 120 mg/dL — ABNORMAL HIGH
Non-HDL Cholesterol (Calc): 164 mg/dL — ABNORMAL HIGH (ref <130)
Total CHOL/HDL Ratio: 4.7 (calc) (ref <5.0)
Triglycerides: 305 mg/dL — ABNORMAL HIGH (ref <150)

## 2024-02-24 LAB — CBC WITH DIFFERENTIAL/PLATELET
Absolute Lymphocytes: 2287 {cells}/uL (ref 850–3900)
Absolute Monocytes: 383 {cells}/uL (ref 200–950)
Basophils Absolute: 51 {cells}/uL (ref 0–200)
Basophils Relative: 0.6 %
Eosinophils Absolute: 578 {cells}/uL — ABNORMAL HIGH (ref 15–500)
Eosinophils Relative: 6.8 %
HCT: 34.8 % — ABNORMAL LOW (ref 35.0–45.0)
Hemoglobin: 11.6 g/dL — ABNORMAL LOW (ref 11.7–15.5)
MCH: 33.5 pg — ABNORMAL HIGH (ref 27.0–33.0)
MCHC: 33.3 g/dL (ref 32.0–36.0)
MCV: 100.6 fL — ABNORMAL HIGH (ref 80.0–100.0)
MPV: 10.6 fL (ref 7.5–12.5)
Monocytes Relative: 4.5 %
Neutro Abs: 5202 {cells}/uL (ref 1500–7800)
Neutrophils Relative %: 61.2 %
Platelets: 305 Thousand/uL (ref 140–400)
RBC: 3.46 Million/uL — ABNORMAL LOW (ref 3.80–5.10)
RDW: 13.6 % (ref 11.0–15.0)
Total Lymphocyte: 26.9 %
WBC: 8.5 Thousand/uL (ref 3.8–10.8)

## 2024-02-24 LAB — URINE CULTURE
MICRO NUMBER:: 17194737
Result:: NO GROWTH
SPECIMEN QUALITY:: ADEQUATE

## 2024-02-24 LAB — HEMOGLOBIN A1C
Hgb A1c MFr Bld: 5 % (ref <5.7)
Mean Plasma Glucose: 97 mg/dL
eAG (mmol/L): 5.4 mmol/L

## 2024-02-24 LAB — VITAMIN D 25 HYDROXY (VIT D DEFICIENCY, FRACTURES): Vit D, 25-Hydroxy: 23 ng/mL — ABNORMAL LOW (ref 30–100)

## 2024-02-24 LAB — B12 AND FOLATE PANEL
Folate: 6.1 ng/mL
Vitamin B-12: 219 pg/mL (ref 200–1100)

## 2024-03-08 ENCOUNTER — Ambulatory Visit

## 2024-03-08 ENCOUNTER — Ambulatory Visit (INDEPENDENT_AMBULATORY_CARE_PROVIDER_SITE_OTHER)

## 2024-03-08 DIAGNOSIS — E538 Deficiency of other specified B group vitamins: Secondary | ICD-10-CM

## 2024-03-08 DIAGNOSIS — I10 Essential (primary) hypertension: Secondary | ICD-10-CM

## 2024-03-08 MED ORDER — CYANOCOBALAMIN 1000 MCG/ML IJ SOLN
1000.0000 ug | Freq: Once | INTRAMUSCULAR | Status: AC
  Administered 2024-03-08: 1000 ug via INTRAMUSCULAR

## 2024-03-08 NOTE — Progress Notes (Signed)
 Patient is in office today for a nurse visit for Blood Pressure Check. Patient blood pressure was 124/78, Patient No chest pain, No shortness of breath, No dyspnea on exertion, No orthopnea, No paroxysmal nocturnal dyspnea, No edema, No palpitations, No syncope.    Per last nlote: Essential hypertension Blood pressure management requires adjustment post-pregnancy. Discussed potential switch back to previous regimen due to weight gain concerns with Depo-Provera . - Restarted spironolactone  and HCTZ 25 mg each. - Prescribed metoprolol  ER  25 mg for palpitations. - Scheduled follow-up in two weeks for blood pressure check. - Advised home blood pressure monitoring and use of remaining labetalol  as needed.

## 2024-03-28 ENCOUNTER — Ambulatory Visit: Admitting: Family Medicine

## 2024-04-19 ENCOUNTER — Ambulatory Visit (INDEPENDENT_AMBULATORY_CARE_PROVIDER_SITE_OTHER): Admitting: Advanced Practice Midwife

## 2024-04-19 ENCOUNTER — Other Ambulatory Visit (HOSPITAL_COMMUNITY)
Admission: RE | Admit: 2024-04-19 | Discharge: 2024-04-19 | Disposition: A | Discharge status: Home / Self Care | Source: Ambulatory Visit | Attending: Advanced Practice Midwife | Admitting: Advanced Practice Midwife

## 2024-04-19 ENCOUNTER — Encounter: Payer: Self-pay | Admitting: Advanced Practice Midwife

## 2024-04-19 VITALS — BP 135/95 | HR 96 | Ht 65.0 in | Wt 210.0 lb

## 2024-04-19 DIAGNOSIS — Z01419 Encounter for gynecological examination (general) (routine) without abnormal findings: Secondary | ICD-10-CM | POA: Diagnosis not present

## 2024-04-19 DIAGNOSIS — R87612 Low grade squamous intraepithelial lesion on cytologic smear of cervix (LGSIL): Secondary | ICD-10-CM | POA: Diagnosis not present

## 2024-04-19 DIAGNOSIS — Z124 Encounter for screening for malignant neoplasm of cervix: Secondary | ICD-10-CM | POA: Insufficient documentation

## 2024-04-19 DIAGNOSIS — Z113 Encounter for screening for infections with a predominantly sexual mode of transmission: Secondary | ICD-10-CM

## 2024-04-19 DIAGNOSIS — Z1331 Encounter for screening for depression: Secondary | ICD-10-CM

## 2024-04-19 NOTE — Progress Notes (Signed)
 "     Gynecology Annual Exam   PCP: Glenard Mire, MD  Chief Complaint:  Chief Complaint  Patient presents with   Annual Exam    History of Present Illness: Patient is a 28 y.o. [id number] presents for annual exam. The patient has no complaints today. She is under the care of PCP for hypertension.  Had LGSIL (CIN1 low grade dysplasia) PAP followed by colposcopy in November/December 2024. Repeat PAP today.  LMP: No LMP recorded. Patient has had an injection.  Postcoital Bleeding: yes 1 time since her delivery in August of this year Dysmenorrhea: no  The patient is sexually active. She currently uses Depo-Provera  injections for contraception. She denies dyspareunia.  The patient does not perform self breast exams.  There is no notable family history of breast or ovarian cancer in her family.  The patient wears seatbelts: yes.   The patient has regular exercise: yes.  She is active at her job and with her children. She reports dietary weakness of fast foods, drinking soda and trying to increase water intake. She usually has 7-8 hours of sleep.  The patient denies current symptoms of depression.    Review of Systems: Review of Systems  Constitutional:  Negative for chills and fever.  HENT:  Negative for congestion, ear discharge, ear pain, hearing loss, sinus pain and sore throat.   Eyes:  Negative for blurred vision and double vision.  Respiratory:  Negative for cough, shortness of breath and wheezing.   Cardiovascular:  Negative for chest pain, palpitations and leg swelling.  Gastrointestinal:  Negative for abdominal pain, blood in stool, constipation, diarrhea, heartburn, melena, nausea and vomiting.  Genitourinary:  Negative for dysuria, flank pain, frequency, hematuria and urgency.  Musculoskeletal:  Negative for back pain, joint pain and myalgias.  Skin:  Negative for itching and rash.  Neurological:  Negative for dizziness, tingling, tremors, sensory change, speech change,  focal weakness, seizures, loss of consciousness, weakness and headaches.  Endo/Heme/Allergies:  Negative for environmental allergies. Does not bruise/bleed easily.  Psychiatric/Behavioral:  Negative for depression, hallucinations, memory loss, substance abuse and suicidal ideas. The patient is not nervous/anxious and does not have insomnia.     Past Medical History:  Patient Active Problem List   Diagnosis Date Noted Date Diagnosed   Abnormal Pap smear of cervix 01/05/2024    Systolic murmur 07/03/2021    Hypertension, benign 11/20/2015     Past Surgical History:  Past Surgical History:  Procedure Laterality Date   CESAREAN SECTION N/A 11/21/2023   Procedure: CESAREAN DELIVERY;  Surgeon: Janit Alm Agent, MD;  Location: [facility] ORS;  Service: Obstetrics;  Laterality: N/A;   TRANSTHORACIC ECHOCARDIOGRAM  07/30/2021   HR 100 bpm. Normal LV size & fxn. EF 55-60%. No RWMA. Gr 1 DD? But normal LA size. Normal Valves. Normal RV, RVSP & RAP.    Gynecologic History:  No LMP recorded. Patient has had an injection. Contraception: Depo-Provera  injections Last Pap: 03/10/23 Results were: low-grade squamous intraepithelial neoplasia (LGSIL - encompassing HPV,mild dysplasia,CIN I)   Obstetric History: [id number]  Family History:  Family History  Problem Relation Age of Onset   Hypertension Mother    Cancer Mother 49       Common Bile Duct   Hypertension Sister    Hypertension Brother    Diabetes Paternal Grandmother    Cancer Paternal Grandmother 5       brain cancer   Diabetes Paternal Grandfather    Hypertension Father    Cancer Father 9  kidney cancer    Social History:  Social History   Socioeconomic History   Marital status: Single    Spouse name: Not on file   Number of children: 3   Years of education: Not on file   Highest education level: 12th grade  Occupational History   Occupation: Glass Blower/designer: [employer]    Comment: [employer]   Tobacco Use   Smoking status: Never   Smokeless tobacco: Never  Vaping Use   Vaping status: Never Used  Substance and Sexual Activity   Alcohol use: No    Alcohol/week: 0.0 standard drinks of alcohol   Drug use: No   Sexual activity: Yes    Partners: Male    Birth control/protection: Injection  Other Topics Concern   Not on file  Social History Narrative   She was living with Missy they have  a baby girl - [place] on July 21st, 2017 and one boy named Missy in 2018, they lived together however separated since Nov 2023    Working full time([employer])    Social Drivers of Health   Tobacco Use: Low Risk (04/19/2024)   Patient History    Smoking Tobacco Use: Never    Smokeless Tobacco Use: Never    Passive Exposure: Not on file  Financial Resource Strain: Medium Risk (02/21/2024)   Overall Financial Resource Strain (CARDIA)    Difficulty of Paying Living Expenses: Somewhat hard  Food Insecurity: Food Insecurity Present (02/21/2024)   Epic    Worried About Programme Researcher, Broadcasting/film/video in the Last Year: Sometimes true    Ran Out of Food in the Last Year: Sometimes true  Transportation Needs: No Transportation Needs (02/21/2024)   Epic    Lack of Transportation (Medical): No    Lack of Transportation (Non-Medical): No  Physical Activity: Insufficiently Active (05/17/2023)   Exercise Vital Sign    Days of Exercise per Week: 1 day    Minutes of Exercise per Session: 30 min  Stress: No Stress Concern Present (05/17/2023)   [employer] of Occupational Health - Occupational Stress Questionnaire    Feeling of Stress : Only a little  Social Connections: Unknown (02/21/2024)   Social Connection and Isolation Panel    Frequency of Communication with Friends and Family: Once a week    Frequency of Social Gatherings with Friends and Family: Once a week    Attends Religious Services: Not on file    Active Member of Clubs or Organizations: No    Attends Banker Meetings: Not  on file    Marital Status: Living with partner  Intimate Partner Violence: Not At Risk (11/20/2023)   Epic    Fear of Current or Ex-Partner: No    Emotionally Abused: No    Physically Abused: No    Sexually Abused: No  Depression (PHQ2-9): Low Risk (04/19/2024)   Depression (PHQ2-9)    PHQ-2 Score: 0  Alcohol Screen: Low Risk (12/16/2022)   Alcohol Screen    Last Alcohol Screening Score (AUDIT): 1  Housing: Unknown (02/21/2024)   Epic    Unable to Pay for Housing in the Last Year: No    Number of Times Moved in the Last Year: Not on file    Homeless in the Last Year: No  Utilities: Not At Risk (11/20/2023)   Epic    Threatened with loss of utilities: No  Health Literacy: Adequate Health Literacy (05/17/2023)   B1300 Health Literacy  Frequency of need for help with medical instructions: Never    Allergies:  Allergies[1]  Medications: Prior to Admission medications  Medication Sig Start Date End Date Taking? Authorizing Provider  metFORMIN  (GLUCOPHAGE -XR) 500 MG 24 hr tablet Take 1 tablet (500 mg total) by mouth daily with breakfast. 02/23/24   Sowles, Krichna, MD  metoprolol  succinate (TOPROL -XL) 25 MG 24 hr tablet Take 1 tablet (25 mg total) by mouth daily. 02/23/24   Sowles, Krichna, MD  nitrofurantoin , macrocrystal-monohydrate, (MACROBID ) 100 MG capsule Take 1 capsule (100 mg total) by mouth 2 (two) times daily. 02/23/24   Sowles, Krichna, MD  spironolactone -hydrochlorothiazide  (ALDACTAZIDE) 25-25 MG tablet Take 1 tablet by mouth daily. 02/23/24   Sowles, Krichna, MD    Physical Exam Vitals: BP (!) 135/95   Pulse 96   Ht 5' 5 (1.651 m)   Wt 210 lb (95.3 kg)    BMI 34.95 kg/m   General: NAD HEENT: normocephalic, anicteric Thyroid : no enlargement, no palpable nodules Pulmonary: No increased work of breathing, CTAB Cardiovascular: RRR, distal pulses 2+ Breast: Breast symmetrical, no tenderness, no palpable nodules or masses, no skin or nipple retraction present, no nipple  discharge.  No axillary or supraclavicular lymphadenopathy. Abdomen: NABS, soft, non-tender, non-distended.  Umbilicus without lesions.  No hepatomegaly, splenomegaly or masses palpable. No evidence of hernia  Genitourinary:  External: Normal external female genitalia.  Normal urethral meatus, normal Bartholin's and Skene's glands.    Vagina: Normal vaginal mucosa, no evidence of prolapse.    Cervix: Ectropion visualized, friable with collection of PAP specimen  Uterus: Non-enlarged, mobile, normal contour.  No CMT  Adnexa: ovaries non-enlarged, no adnexal masses  Rectal: deferred  Lymphatic: no evidence of inguinal lymphadenopathy Extremities: no edema, erythema, or tenderness Neurologic: Grossly intact Psychiatric: mood appropriate, affect full  Flowsheet Row Office Visit from 04/19/2024 in [facility] Total Score 0    Assessment: 28 y.o. [id number] routine annual exam  Plan: Problem List Items Addressed This Visit       Other   Abnormal Pap smear of cervix   Relevant Orders   Cytology - PAP   Other Visit Diagnoses       Well woman exam with routine gynecological exam    -  Primary   Relevant Orders   Cytology - PAP     Screening for cervical cancer       Relevant Orders   Cytology - PAP     Depression screening         Screen for sexually transmitted diseases       Relevant Orders   Cytology - PAP       1) STI screening  was offered and accepted  2)  ASCCP guidelines and rationale discussed.  Patient opts for 3 year screening interval once PAP is normal.  3) Contraception - the patient is currently using  Depo-Provera  injections.  She is happy with her current form of contraception and plans to continue  4) Routine healthcare maintenance including cholesterol, diabetes screening discussed Declines  5) Return in about 1 year (around 04/19/2025) for annual established gyn.   Slater Rains, CNM 04/19/2024 2:34 PM             [1] No Known Allergies  "

## 2024-04-19 NOTE — Patient Instructions (Signed)
 Preventive Care 30-30 Years Old, Female Preventive care refers to lifestyle choices and visits with your health care provider that can promote health and wellness. Preventive care visits are also called wellness exams. What can I expect for my preventive care visit? Counseling During your preventive care visit, your health care provider may ask about your: Medical history, including: Past medical problems. Family medical history. Pregnancy history. Current health, including: Menstrual cycle. Method of birth control. Emotional well-being. Home life and relationship well-being. Sexual activity and sexual health. Lifestyle, including: Alcohol, nicotine or tobacco, and drug use. Access to firearms. Diet, exercise, and sleep habits. Work and work Astronomer. Sunscreen use. Safety issues such as seatbelt and bike helmet use. Physical exam Your health care provider may check your: Height and weight. These may be used to calculate your BMI (body mass index). BMI is a measurement that tells if you are at a healthy weight. Waist circumference. This measures the distance around your waistline. This measurement also tells if you are at a healthy weight and may help predict your risk of certain diseases, such as type 2 diabetes and high blood pressure. Heart rate and blood pressure. Body temperature. Skin for abnormal spots. What immunizations do I need?  Vaccines are usually given at various ages, according to a schedule. Your health care provider will recommend vaccines for you based on your age, medical history, and lifestyle or other factors, such as travel or where you work. What tests do I need? Screening Your health care provider may recommend screening tests for certain conditions. This may include: Pelvic exam and Pap test. Lipid and cholesterol levels. Diabetes screening. This is done by checking your blood sugar (glucose) after you have not eaten for a while (fasting). Hepatitis  B test. Hepatitis C test. HIV (human immunodeficiency virus) test. STI (sexually transmitted infection) testing, if you are at risk. BRCA-related cancer screening. This may be done if you have a family history of breast, ovarian, tubal, or peritoneal cancers. Talk with your health care provider about your test results, treatment options, and if necessary, the need for more tests. Follow these instructions at home: Eating and drinking  Eat a healthy diet that includes fresh fruits and vegetables, whole grains, lean protein, and low-fat dairy products. Take vitamin and mineral supplements as recommended by your health care provider. Do not drink alcohol if: Your health care provider tells you not to drink. You are pregnant, may be pregnant, or are planning to become pregnant. If you drink alcohol: Limit how much you have to 0-1 drink a day. Know how much alcohol is in your drink. In the [place] one drink equals one 12 oz bottle of beer (355 mL), one 5 oz glass of wine (148 mL), or one 1 oz glass of hard liquor (44 mL). Lifestyle Brush your teeth every morning and night with fluoride toothpaste. Floss one time each day. Exercise for at least 30 minutes 5 or more days each week. Do not use any products that contain nicotine or tobacco. These products include cigarettes, chewing tobacco, and vaping devices, such as e-cigarettes. If you need help quitting, ask your health care provider. Do not use drugs. If you are sexually active, practice safe sex. Use a condom or other form of protection to prevent STIs. If you do not wish to become pregnant, use a form of birth control. If you plan to become pregnant, see your health care provider for a prepregnancy visit. Find healthy ways to manage stress, such as: Meditation,  yoga, or listening to music. Journaling. Talking to a trusted person. Spending time with friends and family. Minimize exposure to UV radiation to reduce your risk of skin  cancer. Safety Always wear your seat belt while driving or riding in a vehicle. Do not drive: If you have been drinking alcohol. Do not ride with someone who has been drinking. If you have been using any mind-altering substances or drugs. While texting. When you are tired or distracted. Wear a helmet and other protective equipment during sports activities. If you have firearms in your house, make sure you follow all gun safety procedures. Seek help if you have been physically or sexually abused. What's next? Go to your health care provider once a year for an annual wellness visit. Ask your health care provider how often you should have your eyes and teeth checked. Stay up to date on all vaccines. This information is not intended to replace advice given to you by your health care provider. Make sure you discuss any questions you have with your health care provider. Document Revised: 10/02/2020 Document Reviewed: 10/02/2020 Elsevier Patient Education  2024 Else[employer]. How to Do a Breast Self-Exam Doing breast self-exams can help you stay healthy. They're one way to know what's normal for your breasts. They can help you catch a problem while it's still small and can be treated. You need to: Check your breasts often. Tell your doctor about any changes. You should do breast self-exams even if you have breast implants. What you need: A mirror. A well-lit room. A pillow or other soft object. How to do a breast self-exam Look for changes  Take off all the clothes above your waist. Stand in front of a mirror in a room with good lighting. Put your hands down at your sides. Compare your breasts in the mirror. Look for difference between them, such as: Differences in shape. Differences in size. Wrinkles, dips, and bumps in one breast and not the other. Look at each breast for skin changes, such as: Redness. Scaly spots. Spots where your skin is thicker. Dimpling. Open sores. Look  for changes in your nipples, such as: Fluid coming out of a nipple. Fluid around a nipple. Bleeding. Dimpling. Redness. A nipple that looks pushed in or that has changed position. Feel for changes Lie on your back. Feel each breast. To do this: Pick a breast to feel. Place a pillow under the shoulder closest to that breast. Put the arm closest to that breast behind your head. Feel the breast using the hand of your other arm. Use the pads of your three middle fingers to make small circles starting near the nipple. Use light, medium, and firm pressure. Keep making circles, moving down over the breast. Stop when you feel your ribs. Start making circles with your fingers again, this time going up until you reach your collarbone. Then, make circles out across your breast and into your armpit area. Squeeze your nipple. Check for fluid and lumps. Do these steps again to check your other breast. Sit or stand in the tub or shower. With soapy water on your skin, feel each breast the same way you did when you were lying down. Write down what you find Writing down what you find can help you keep track of what you want to tell your doctor. Write down: What's normal for each breast. Any changes you find. Write down: The kind of change. If your breast feels tender or painful. Any lump you find. Write  down its size and where it is. When you last had your period. General tips If you're breastfeeding, the best time to check your breasts is after you feed your baby or after you use a breast pump. If you get a period, the best time to check your breasts is 5-7 days after your period ends. With time, you'll get more used to doing the self-exam. You'll also start to know if there are changes in your breasts. Contact a doctor if: You see a change in the shape or size of your breasts or nipples. You see a change in the skin of your breast or nipples. You have fluid coming from your nipples that isn't  normal. You find a new lump or thick area. You have breast pain. You have any concerns about your breast health. This information is not intended to replace advice given to you by your health care provider. Make sure you discuss any questions you have with your health care provider. Document Revised: 06/16/2023 Document Reviewed: 06/16/2023 Elsevier Patient Education  2025 [employer]. Pap Test: What to Know Why am I having this test? A Pap test, also called a Pap smear, is a screening test to check for signs of: Infection. Cancer of the cervix. The cervix is the lowest part of the uterus. Precancerous changes. These are changes that may be a sign that cancer is developing. Females need this test regularly. In general, you should have a Pap test every 3 years until you reach menopause or you are 30 years old. If you are 41-30 years old you may choose to have their Pap test done at the same time as an human papillomavirus (HPV) test every 5 years instead of every 3 years. Your health care provider may recommend having Pap tests more or less often depending on your medical conditions and past Pap test results. What is being tested? Cervical cells are tested for signs of infection or abnormalities. What kind of sample is taken?  Your provider will collect a sample of cells from the surface of your cervix. This will be done using a small cotton swab, plastic spatula, or brush that is inserted into your vagina using a tool called a speculum. This sample is often collected during a pelvic exam, when you are lying on your back on an exam table with your feet in footrests, called stirrups. In some cases, fluids (secretions) from the cervix or vagina may also be collected. How do I prepare for this test? Know where you are in your menstrual cycle. If you're menstruating on the day of the test, you may be asked to reschedule. You may need to reschedule if you have a known vaginal infection on the  day of the test. Follow instructions from your provider about: Changing or stopping your regular medicines. Some medicines, such as vaginal medicines and tetracycline, can cause abnormal test results. Avoiding douching 2-3 days before or the day of the test. Tell a health care provider about: Any allergies you have. All medicines you take. These include vitamins, herbs, eye drops, and creams. Any bleeding problems you have. Any surgeries you've had. Any medical problems you have. Whether you're pregnant or may be pregnant. How are the results reported? Your test results will be reported as either abnormal or normal. What do the results mean? A normal test result means that you do not have signs of cancer of the cervix. An abnormal result may mean that you have: Cancer. A Pap test by itself  is not enough to diagnose cancer. You will have more tests done if cancer is suspected. Precancerous changes in your cervix. Inflammation of the cervix. A sexually transmitted infection (STI). A fungal infection. An infection from a parasite. Talk with your provider about what your results mean. More tests may be needed. Questions to ask your health care provider Ask your provider, or the department that is doing the test: When will my results be ready? How will I get my results? What are my treatment options? What other tests do I need? What are my next steps? This information is not intended to replace advice given to you by your health care provider. Make sure you discuss any questions you have with your health care provider. Document Revised: 06/26/2023 Document Reviewed: 06/26/2023 Elsevier Patient Education  2025 [employer].

## 2024-04-25 LAB — CYTOLOGY - PAP
Chlamydia: POSITIVE — AB
Comment: NEGATIVE
Comment: NEGATIVE
Comment: NEGATIVE
Comment: NEGATIVE
Comment: NEGATIVE
Comment: NORMAL
HPV 16: NEGATIVE
HPV 18 / 45: NEGATIVE
High risk HPV: POSITIVE — AB
Neisseria Gonorrhea: NEGATIVE
Trichomonas: NEGATIVE

## 2024-04-27 ENCOUNTER — Other Ambulatory Visit: Payer: Self-pay | Admitting: Advanced Practice Midwife

## 2024-04-27 ENCOUNTER — Ambulatory Visit: Payer: Self-pay | Admitting: Advanced Practice Midwife

## 2024-04-27 DIAGNOSIS — A749 Chlamydial infection, unspecified: Secondary | ICD-10-CM

## 2024-04-27 MED ORDER — AZITHROMYCIN 500 MG PO TABS: 1000.0000 mg | ORAL_TABLET | Freq: Once | ORAL | 0 refills | Status: AC

## 2024-04-27 NOTE — Progress Notes (Signed)
 Rx azithromycin  sent to treat chlamydia infection. Message to patient regarding that and abnormal PAP smear.

## 2024-05-17 ENCOUNTER — Other Ambulatory Visit: Payer: Self-pay | Admitting: Family Medicine

## 2024-05-17 ENCOUNTER — Ambulatory Visit (INDEPENDENT_AMBULATORY_CARE_PROVIDER_SITE_OTHER)

## 2024-05-17 ENCOUNTER — Other Ambulatory Visit (HOSPITAL_COMMUNITY)
Admission: RE | Admit: 2024-05-17 | Discharge: 2024-05-17 | Disposition: A | Discharge status: Home / Self Care | Source: Ambulatory Visit | Attending: Licensed Practical Nurse | Admitting: Licensed Practical Nurse

## 2024-05-17 VITALS — BP 142/106 | HR 110 | Resp 16 | Ht 65.0 in | Wt 216.5 lb

## 2024-05-17 DIAGNOSIS — Z3042 Encounter for surveillance of injectable contraceptive: Secondary | ICD-10-CM | POA: Diagnosis not present

## 2024-05-17 DIAGNOSIS — Z113 Encounter for screening for infections with a predominantly sexual mode of transmission: Secondary | ICD-10-CM | POA: Diagnosis present

## 2024-05-17 DIAGNOSIS — I10 Essential (primary) hypertension: Secondary | ICD-10-CM

## 2024-05-17 DIAGNOSIS — R7303 Prediabetes: Secondary | ICD-10-CM

## 2024-05-17 MED ORDER — MEDROXYPROGESTERONE ACETATE 150 MG/ML IM SUSP
150.0000 mg | Freq: Once | INTRAMUSCULAR | Status: AC
  Administered 2024-05-17: 150 mg via INTRAMUSCULAR

## 2024-05-17 NOTE — Patient Instructions (Signed)

## 2024-05-17 NOTE — Progress Notes (Signed)
" ° ° °  NURSE VISIT NOTE  Subjective:    Patient ID: Lori Gross, female    DOB: Oct 08, 1995, 29 y.o.   MRN: [id number]  HPI  Patient is a 29 y.o. [id number] female who presents for depo provera  injection. She recently had a positive chlamydia result, she finished the treatment and would like a test of cure today.   Objective:    BP (!) 142/106   Pulse (!) 110   Resp 16   Ht 5' 5 (1.651 m)   Wt 216 lb 8 oz (98.2 kg)   BMI 36.03 kg/m   Last Annual: 04/19/2024. Last pap: . Last Depo-Provera : 02/23/2024. Side Effects if any: none. Serum HCG indicated? No . Depo-Provera  150 mg IM given by: Camelia Fetters, CMA. Site: Left Deltoid  Lab Review  No results found for any visits on 05/17/24.  Assessment:   1. Encounter for Depo-Provera  contraception   2. Screen for STD (sexually transmitted disease)      Plan:   Next appointment due between April 15 and April 29.    Camelia Fetters, CMA  [facility]

## 2024-05-18 NOTE — Telephone Encounter (Signed)
 Requested Prescriptions  Pending Prescriptions Disp Refills   metFORMIN  (GLUCOPHAGE -XR) 500 MG 24 hr tablet [Pharmacy Med Name: METFORMIN  HCL ER 500 MG TABLET] 90 tablet 0    Sig: TAKE 1 TABLET BY MOUTH EVERY DAY WITH BREAKFAST     Endocrinology:  Diabetes - Biguanides Passed - 05/18/2024  1:42 PM      Passed - Cr in normal range and within 360 days    Creat  Date Value Ref Range Status  02/23/2024 0.79 0.50 - 0.97 mg/dL Final   Creatinine, Urine  Date Value Ref Range Status  11/19/2023 190 mg/dL Final         Passed - HBA1C is between 0 and 7.9 and within 180 days    Hgb A1c MFr Bld  Date Value Ref Range Status  02/23/2024 5.0 <5.7 % Final    Comment:    For the purpose of screening for the presence of diabetes: . <5.7%       Consistent with the absence of diabetes 5.7-6.4%    Consistent with increased risk for diabetes             (prediabetes) > or =6.5%  Consistent with diabetes . This assay result is consistent with a decreased risk of diabetes. . Currently, no consensus exists regarding use of hemoglobin A1c for diagnosis of diabetes in children. . According to American Diabetes Association (ADA) guidelines, hemoglobin A1c <7.0% represents optimal control in non-pregnant diabetic patients. Different metrics may apply to specific patient populations.  Standards of Medical Care in Diabetes(ADA). .          Passed - eGFR in normal range and within 360 days    GFR, Est African American  Date Value Ref Range Status  11/22/2019 140 > OR = 60 mL/min/1.3m2 Final   GFR, Est Non African American  Date Value Ref Range Status  11/22/2019 120 > OR = 60 mL/min/1.31m2 Final   GFR, Estimated  Date Value Ref Range Status  11/19/2023 >60 >60 mL/min Final    Comment:    (NOTE) Calculated using the CKD-EPI Creatinine Equation (2021)    eGFR  Date Value Ref Range Status  02/23/2024 103 > OR = 60 mL/min/1.33m2 Final  10/27/2023 110 >59 mL/min/1.73 Final          Passed - B12 Level in normal range and within 720 days    Vitamin B-12  Date Value Ref Range Status  02/23/2024 219 200 - 1,100 pg/mL Final    Comment:    . Please Note: Although the reference range for vitamin B12 is [phone] pg/mL, it has been reported that between 5 and 10% of patients with values between 200 and 400 pg/mL may experience neuropsychiatric and hematologic abnormalities due to occult B12 deficiency; less than 1% of patients with values above 400 pg/mL will have symptoms. SABRA Amy - Valid encounter within last 6 months    Recent Outpatient Visits           2 months ago Morbid obesity [facility])   [place] [facility] [place], Dorette, MD              Passed - CBC within normal limits and completed in the last 12 months    WBC  Date Value Ref Range Status  02/23/2024 8.5 3.8 - 10.8 Thousand/uL Final   RBC  Date Value Ref Range Status  02/23/2024 3.46 (L) 3.80 - 5.10 Million/uL Final  Hemoglobin  Date Value Ref Range Status  02/23/2024 11.6 (L) 11.7 - 15.5 g/dL Final  92/90/7974 89.6 (L) 11.1 - 15.9 g/dL Final   HCT  Date Value Ref Range Status  02/23/2024 34.8 (L) 35.0 - 45.0 % Final   Hematocrit  Date Value Ref Range Status  10/27/2023 29.7 (L) 34.0 - 46.6 % Final   MCHC  Date Value Ref Range Status  02/23/2024 33.3 32.0 - 36.0 g/dL Final    Comment:    For adults, a slight decrease in the calculated MCHC value (in the range of 30 to 32 g/dL) is most likely not clinically significant; however, it should be interpreted with caution in correlation with other red cell parameters and the patient's clinical condition.    [facility]  Date Value Ref Range Status  02/23/2024 33.5 (H) 27.0 - 33.0 pg Final   MCV  Date Value Ref Range Status  02/23/2024 100.6 (H) 80.0 - 100.0 fL Final  10/27/2023 104 (H) 79 - 97 fL Final   No results found for: PLTCOUNTKUC, LABPLAT, POCPLA RDW  Date Value Ref Range Status  02/23/2024  13.6 11.0 - 15.0 % Final  10/27/2023 13.3 11.7 - 15.4 % Final          metoprolol  succinate (TOPROL -XL) 25 MG 24 hr tablet [Pharmacy Med Name: METOPROLOL  SUCC ER 25 MG TAB] 90 tablet 0    Sig: TAKE 1 TABLET (25 MG TOTAL) BY MOUTH DAILY.     Cardiovascular:  Beta Blockers Failed - 05/18/2024  1:42 PM      Failed - Last BP in normal range    BP Readings from Last 1 Encounters:  05/17/24 (!) 142/106         Passed - Last Heart Rate in normal range    Pulse Readings from Last 1 Encounters:  05/17/24 (!) 110         Passed - Valid encounter within last 6 months    Recent Outpatient Visits           2 months ago Morbid obesity [facility])   [facility] Glenard Mire, MD

## 2024-05-19 ENCOUNTER — Ambulatory Visit: Payer: Self-pay | Admitting: Advanced Practice Midwife

## 2024-05-19 LAB — CERVICOVAGINAL ANCILLARY ONLY
Bacterial Vaginitis (gardnerella): NEGATIVE
Candida Glabrata: NEGATIVE
Candida Vaginitis: NEGATIVE
Chlamydia: NEGATIVE
Comment: NEGATIVE
Comment: NEGATIVE
Comment: NEGATIVE
Comment: NEGATIVE
Comment: NEGATIVE
Comment: NORMAL
Neisseria Gonorrhea: NEGATIVE
Trichomonas: NEGATIVE

## 2024-05-24 ENCOUNTER — Ambulatory Visit: Admitting: Family Medicine

## 2024-05-31 ENCOUNTER — Encounter: Admitting: Obstetrics

## 2024-08-09 ENCOUNTER — Ambulatory Visit
